# Patient Record
Sex: Female | Born: 1935
Health system: Southern US, Community
[De-identification: ages and names within clinical notes are randomized; demographics above are authoritative.]

## PROBLEM LIST (undated history)

## (undated) DIAGNOSIS — D693 Immune thrombocytopenic purpura: Secondary | ICD-10-CM

## (undated) DIAGNOSIS — Z803 Family history of malignant neoplasm of breast: Secondary | ICD-10-CM

## (undated) DIAGNOSIS — I10 Essential (primary) hypertension: Secondary | ICD-10-CM

## (undated) DIAGNOSIS — K589 Irritable bowel syndrome without diarrhea: Secondary | ICD-10-CM

## (undated) DIAGNOSIS — N6019 Diffuse cystic mastopathy of unspecified breast: Secondary | ICD-10-CM

## (undated) DIAGNOSIS — C50919 Malignant neoplasm of unspecified site of unspecified female breast: Secondary | ICD-10-CM

## (undated) DIAGNOSIS — Z800 Family history of malignant neoplasm of digestive organs: Secondary | ICD-10-CM

## (undated) DIAGNOSIS — K5730 Diverticulosis of large intestine without perforation or abscess without bleeding: Secondary | ICD-10-CM

## (undated) DIAGNOSIS — Z9081 Acquired absence of spleen: Secondary | ICD-10-CM

## (undated) DIAGNOSIS — Z808 Family history of malignant neoplasm of other organs or systems: Secondary | ICD-10-CM

## (undated) DIAGNOSIS — E039 Hypothyroidism, unspecified: Secondary | ICD-10-CM

## (undated) DIAGNOSIS — C801 Malignant (primary) neoplasm, unspecified: Secondary | ICD-10-CM

## (undated) DIAGNOSIS — C55 Malignant neoplasm of uterus, part unspecified: Secondary | ICD-10-CM

## (undated) DIAGNOSIS — I8390 Asymptomatic varicose veins of unspecified lower extremity: Secondary | ICD-10-CM

## (undated) DIAGNOSIS — E0500 Thyrotoxicosis with diffuse goiter without thyrotoxic crisis or storm: Secondary | ICD-10-CM

## (undated) DIAGNOSIS — Z8042 Family history of malignant neoplasm of prostate: Secondary | ICD-10-CM

## (undated) HISTORY — DX: Hypothyroidism, unspecified: E03.9

## (undated) HISTORY — DX: Family history of malignant neoplasm of prostate: Z80.42

## (undated) HISTORY — DX: Family history of malignant neoplasm of digestive organs: Z80.0

## (undated) HISTORY — DX: Thyrotoxicosis with diffuse goiter without thyrotoxic crisis or storm: E05.00

## (undated) HISTORY — DX: Malignant (primary) neoplasm, unspecified: C80.1

## (undated) HISTORY — DX: Diffuse cystic mastopathy of unspecified breast: N60.19

## (undated) HISTORY — PX: OOPHORECTOMY: SHX86

## (undated) HISTORY — DX: Irritable bowel syndrome without diarrhea: K58.9

## (undated) HISTORY — DX: Malignant neoplasm of unspecified site of unspecified female breast: C50.919

## (undated) HISTORY — DX: Malignant neoplasm of uterus, part unspecified: C55

## (undated) HISTORY — DX: Family history of malignant neoplasm of breast: Z80.3

## (undated) HISTORY — DX: Acquired absence of spleen: Z90.81

## (undated) HISTORY — DX: Asymptomatic varicose veins of unspecified lower extremity: I83.90

## (undated) HISTORY — DX: Essential (primary) hypertension: I10

## (undated) HISTORY — DX: Family history of malignant neoplasm of other organs or systems: Z80.8

## (undated) HISTORY — DX: Diverticulosis of large intestine without perforation or abscess without bleeding: K57.30

## (undated) HISTORY — DX: Immune thrombocytopenic purpura: D69.3

---
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
## 1984-03-19 HISTORY — PX: SPLENECTOMY: SUR1306

## 1999-03-20 HISTORY — PX: ABDOMINAL HYSTERECTOMY: SHX81

## 1999-03-23 DIAGNOSIS — C55 Malignant neoplasm of uterus, part unspecified: Secondary | ICD-10-CM

## 1999-03-23 HISTORY — DX: Malignant neoplasm of uterus, part unspecified: C55

## 1999-03-28 ENCOUNTER — Ambulatory Visit (HOSPITAL_COMMUNITY): Admission: RE | Admit: 1999-03-28 | Discharge: 1999-03-28 | Payer: Self-pay | Admitting: Obstetrics and Gynecology

## 1999-04-19 ENCOUNTER — Encounter: Payer: Self-pay | Admitting: Obstetrics and Gynecology

## 1999-04-25 ENCOUNTER — Inpatient Hospital Stay (HOSPITAL_COMMUNITY): Admission: RE | Admit: 1999-04-25 | Discharge: 1999-04-28 | Payer: Self-pay | Admitting: Obstetrics and Gynecology

## 2000-11-19 ENCOUNTER — Other Ambulatory Visit: Admission: RE | Admit: 2000-11-19 | Discharge: 2000-11-19 | Payer: Self-pay | Admitting: Obstetrics and Gynecology

## 2001-05-27 ENCOUNTER — Other Ambulatory Visit: Admission: RE | Admit: 2001-05-27 | Discharge: 2001-05-27 | Payer: Self-pay | Admitting: Obstetrics and Gynecology

## 2001-11-25 ENCOUNTER — Other Ambulatory Visit: Admission: RE | Admit: 2001-11-25 | Discharge: 2001-11-25 | Payer: Self-pay | Admitting: Obstetrics and Gynecology

## 2002-03-19 HISTORY — PX: MASTECTOMY: SHX3

## 2002-05-26 ENCOUNTER — Other Ambulatory Visit: Admission: RE | Admit: 2002-05-26 | Discharge: 2002-05-26 | Payer: Self-pay | Admitting: Obstetrics and Gynecology

## 2002-06-08 ENCOUNTER — Ambulatory Visit (HOSPITAL_COMMUNITY): Admission: RE | Admit: 2002-06-08 | Discharge: 2002-06-08 | Payer: Self-pay | Admitting: Gastroenterology

## 2003-01-04 ENCOUNTER — Other Ambulatory Visit: Admission: RE | Admit: 2003-01-04 | Discharge: 2003-01-04 | Payer: Self-pay | Admitting: Obstetrics and Gynecology

## 2003-02-04 ENCOUNTER — Encounter (HOSPITAL_COMMUNITY): Admission: RE | Admit: 2003-02-04 | Discharge: 2003-05-05 | Payer: Self-pay | Admitting: Radiology

## 2003-02-10 ENCOUNTER — Encounter: Admission: RE | Admit: 2003-02-10 | Discharge: 2003-02-10 | Payer: Self-pay | Admitting: Radiology

## 2003-02-10 ENCOUNTER — Encounter (INDEPENDENT_AMBULATORY_CARE_PROVIDER_SITE_OTHER): Payer: Self-pay | Admitting: Radiology

## 2003-02-10 ENCOUNTER — Encounter (INDEPENDENT_AMBULATORY_CARE_PROVIDER_SITE_OTHER): Payer: Self-pay | Admitting: *Deleted

## 2003-02-10 DIAGNOSIS — C50919 Malignant neoplasm of unspecified site of unspecified female breast: Secondary | ICD-10-CM

## 2003-02-10 HISTORY — DX: Malignant neoplasm of unspecified site of unspecified female breast: C50.919

## 2003-02-16 ENCOUNTER — Encounter: Admission: RE | Admit: 2003-02-16 | Discharge: 2003-02-16 | Payer: Self-pay | Admitting: Surgery

## 2003-02-17 ENCOUNTER — Encounter (INDEPENDENT_AMBULATORY_CARE_PROVIDER_SITE_OTHER): Payer: Self-pay | Admitting: *Deleted

## 2003-02-17 ENCOUNTER — Ambulatory Visit (HOSPITAL_BASED_OUTPATIENT_CLINIC_OR_DEPARTMENT_OTHER): Admission: RE | Admit: 2003-02-17 | Discharge: 2003-02-17 | Payer: Self-pay | Admitting: Surgery

## 2003-02-17 ENCOUNTER — Ambulatory Visit (HOSPITAL_COMMUNITY): Admission: RE | Admit: 2003-02-17 | Discharge: 2003-02-17 | Payer: Self-pay | Admitting: Surgery

## 2003-02-17 HISTORY — PX: BREAST SURGERY: SHX581

## 2003-05-19 ENCOUNTER — Other Ambulatory Visit: Admission: RE | Admit: 2003-05-19 | Discharge: 2003-05-19 | Payer: Self-pay | Admitting: Obstetrics and Gynecology

## 2003-11-17 ENCOUNTER — Other Ambulatory Visit: Admission: RE | Admit: 2003-11-17 | Discharge: 2003-11-17 | Payer: Self-pay | Admitting: Obstetrics and Gynecology

## 2004-03-23 ENCOUNTER — Ambulatory Visit: Payer: Self-pay | Admitting: Oncology

## 2004-05-02 ENCOUNTER — Other Ambulatory Visit: Admission: RE | Admit: 2004-05-02 | Discharge: 2004-05-02 | Payer: Self-pay | Admitting: Obstetrics and Gynecology

## 2004-07-13 ENCOUNTER — Ambulatory Visit: Payer: Self-pay | Admitting: Oncology

## 2004-07-14 ENCOUNTER — Ambulatory Visit (HOSPITAL_COMMUNITY): Admission: RE | Admit: 2004-07-14 | Discharge: 2004-07-14 | Payer: Self-pay

## 2005-01-11 ENCOUNTER — Ambulatory Visit: Payer: Self-pay | Admitting: Oncology

## 2005-01-18 ENCOUNTER — Ambulatory Visit: Payer: Self-pay | Admitting: Oncology

## 2005-03-27 ENCOUNTER — Ambulatory Visit: Payer: Self-pay | Admitting: Oncology

## 2005-05-04 ENCOUNTER — Other Ambulatory Visit: Admission: RE | Admit: 2005-05-04 | Discharge: 2005-05-04 | Payer: Self-pay | Admitting: Obstetrics and Gynecology

## 2005-09-26 ENCOUNTER — Ambulatory Visit: Payer: Self-pay | Admitting: Oncology

## 2005-11-29 ENCOUNTER — Encounter: Admission: RE | Admit: 2005-11-29 | Discharge: 2005-11-29 | Payer: Self-pay | Admitting: Orthopedic Surgery

## 2005-12-19 ENCOUNTER — Encounter: Admission: RE | Admit: 2005-12-19 | Discharge: 2005-12-19 | Payer: Self-pay | Admitting: Orthopedic Surgery

## 2006-01-15 ENCOUNTER — Ambulatory Visit: Payer: Self-pay | Admitting: Oncology

## 2006-01-17 LAB — COMPREHENSIVE METABOLIC PANEL
AST: 27 U/L (ref 0–37)
Alkaline Phosphatase: 62 U/L (ref 39–117)
CO2: 28 mEq/L (ref 19–32)
Creatinine, Ser: 0.67 mg/dL (ref 0.40–1.20)
Sodium: 138 mEq/L (ref 135–145)
Total Bilirubin: 0.8 mg/dL (ref 0.3–1.2)
Total Protein: 7.3 g/dL (ref 6.0–8.3)

## 2006-01-17 LAB — CBC WITH DIFFERENTIAL (CANCER CENTER ONLY)
Eosinophils Absolute: 0.1 10*3/uL (ref 0.0–0.5)
HCT: 42.7 % (ref 34.8–46.6)
HGB: 14.4 g/dL (ref 11.6–15.9)
MCHC: 33.8 g/dL (ref 32.0–36.0)
MCV: 96 fL (ref 81–101)
MONO%: 7.8 % (ref 0.0–13.0)
RDW: 12.3 % (ref 10.5–14.6)

## 2006-01-25 ENCOUNTER — Ambulatory Visit: Payer: Self-pay | Admitting: Oncology

## 2006-02-22 ENCOUNTER — Encounter: Admission: RE | Admit: 2006-02-22 | Discharge: 2006-02-22 | Payer: Self-pay | Admitting: Orthopedic Surgery

## 2006-05-23 ENCOUNTER — Other Ambulatory Visit: Admission: RE | Admit: 2006-05-23 | Discharge: 2006-05-23 | Payer: Self-pay | Admitting: Obstetrics and Gynecology

## 2006-07-18 ENCOUNTER — Ambulatory Visit: Payer: Self-pay | Admitting: Oncology

## 2006-07-19 LAB — CBC WITH DIFFERENTIAL (CANCER CENTER ONLY)
BASO%: 0.5 % (ref 0.0–2.0)
HCT: 42.2 % (ref 34.8–46.6)
MCH: 32.8 pg (ref 26.0–34.0)
MCV: 95 fL (ref 81–101)
NEUT#: 3.4 10*3/uL (ref 1.5–6.5)
NEUT%: 62.5 % (ref 39.6–80.0)
RBC: 4.45 10*6/uL (ref 3.70–5.32)
WBC: 5.4 10*3/uL (ref 3.9–10.0)

## 2006-07-19 LAB — COMPREHENSIVE METABOLIC PANEL
AST: 26 U/L (ref 0–37)
Alkaline Phosphatase: 68 U/L (ref 39–117)
BUN: 13 mg/dL (ref 6–23)
CO2: 28 mEq/L (ref 19–32)
Creatinine, Ser: 0.7 mg/dL (ref 0.40–1.20)
Glucose, Bld: 88 mg/dL (ref 70–99)
Sodium: 141 mEq/L (ref 135–145)
Total Protein: 7.1 g/dL (ref 6.0–8.3)

## 2006-07-26 ENCOUNTER — Ambulatory Visit: Payer: Self-pay | Admitting: Oncology

## 2007-01-10 ENCOUNTER — Ambulatory Visit: Payer: Self-pay | Admitting: Oncology

## 2007-01-13 LAB — COMPREHENSIVE METABOLIC PANEL
ALT: 36 U/L — ABNORMAL HIGH (ref 0–35)
Alkaline Phosphatase: 68 U/L (ref 39–117)
BUN: 13 mg/dL (ref 6–23)
CO2: 28 mEq/L (ref 19–32)
Calcium: 9.8 mg/dL (ref 8.4–10.5)
Potassium: 4.1 mEq/L (ref 3.5–5.3)
Sodium: 142 mEq/L (ref 135–145)
Total Bilirubin: 0.7 mg/dL (ref 0.3–1.2)

## 2007-01-13 LAB — CBC WITH DIFFERENTIAL (CANCER CENTER ONLY)
BASO#: 0 10*3/uL (ref 0.0–0.2)
Eosinophils Absolute: 0.1 10*3/uL (ref 0.0–0.5)
HGB: 13.4 g/dL (ref 11.6–15.9)
LYMPH#: 1.3 10*3/uL (ref 0.9–3.3)
LYMPH%: 26.7 % (ref 14.0–48.0)
MCV: 95 fL (ref 81–101)
MONO%: 8.5 % (ref 0.0–13.0)
Platelets: 289 10*3/uL (ref 145–400)
WBC: 5 10*3/uL (ref 3.9–10.0)

## 2007-01-20 ENCOUNTER — Ambulatory Visit: Payer: Self-pay | Admitting: Oncology

## 2007-05-26 ENCOUNTER — Other Ambulatory Visit: Admission: RE | Admit: 2007-05-26 | Discharge: 2007-05-26 | Payer: Self-pay | Admitting: Obstetrics and Gynecology

## 2008-01-12 ENCOUNTER — Ambulatory Visit: Payer: Self-pay | Admitting: Oncology

## 2008-01-13 LAB — CBC WITH DIFFERENTIAL (CANCER CENTER ONLY)
HCT: 38.5 % (ref 34.8–46.6)
HGB: 13.4 g/dL (ref 11.6–15.9)
LYMPH#: 1.3 10*3/uL (ref 0.9–3.3)
LYMPH%: 28.8 % (ref 14.0–48.0)
MCH: 32 pg (ref 26.0–34.0)
NEUT#: 2.8 10*3/uL (ref 1.5–6.5)
NEUT%: 60.1 % (ref 39.6–80.0)
Platelets: 297 10*3/uL (ref 145–400)
RBC: 4.19 10*6/uL (ref 3.70–5.32)

## 2008-01-13 LAB — CMP (CANCER CENTER ONLY)
ALT(SGPT): 28 U/L (ref 10–47)
AST: 41 U/L — ABNORMAL HIGH (ref 11–38)
Albumin: 4.1 g/dL (ref 3.3–5.5)
CO2: 31 mEq/L (ref 18–33)
Calcium: 9.7 mg/dL (ref 8.0–10.3)
Chloride: 101 mEq/L (ref 98–108)
Creat: 0.5 mg/dl — ABNORMAL LOW (ref 0.6–1.2)
Potassium: 3.9 mEq/L (ref 3.3–4.7)
Total Protein: 7.5 g/dL (ref 6.4–8.1)

## 2008-01-13 LAB — LACTATE DEHYDROGENASE: LDH: 159 U/L (ref 94–250)

## 2008-02-02 ENCOUNTER — Ambulatory Visit: Payer: Self-pay | Admitting: Oncology

## 2008-05-26 ENCOUNTER — Other Ambulatory Visit: Admission: RE | Admit: 2008-05-26 | Discharge: 2008-05-26 | Payer: Self-pay | Admitting: Obstetrics and Gynecology

## 2008-05-26 ENCOUNTER — Ambulatory Visit: Payer: Self-pay | Admitting: Obstetrics and Gynecology

## 2008-05-26 ENCOUNTER — Encounter: Payer: Self-pay | Admitting: Obstetrics and Gynecology

## 2008-06-04 ENCOUNTER — Ambulatory Visit: Payer: Self-pay | Admitting: Obstetrics and Gynecology

## 2009-01-20 ENCOUNTER — Ambulatory Visit: Payer: Self-pay | Admitting: Oncology

## 2009-01-24 LAB — CBC WITH DIFFERENTIAL (CANCER CENTER ONLY)
MCH: 32 pg (ref 26.0–34.0)
MCV: 95 fL (ref 81–101)
MONO#: 0.4 10*3/uL (ref 0.1–0.9)
NEUT#: 2.6 10*3/uL (ref 1.5–6.5)
NEUT%: 57.7 % (ref 39.6–80.0)
Platelets: 288 10*3/uL (ref 145–400)
RBC: 3.95 10*6/uL (ref 3.70–5.32)
WBC: 4.4 10*3/uL (ref 3.9–10.0)

## 2009-01-24 LAB — CMP (CANCER CENTER ONLY)
BUN, Bld: 13 mg/dL (ref 7–22)
CO2: 30 mEq/L (ref 18–33)
Potassium: 3.9 mEq/L (ref 3.3–4.7)

## 2009-01-25 LAB — LACTATE DEHYDROGENASE: LDH: 167 U/L (ref 94–250)

## 2009-01-25 LAB — VITAMIN D 25 HYDROXY (VIT D DEFICIENCY, FRACTURES): Vit D, 25-Hydroxy: 41 ng/mL (ref 30–89)

## 2009-01-25 LAB — TSH: TSH: 1.176 u[IU]/mL (ref 0.350–4.500)

## 2009-01-30 ENCOUNTER — Ambulatory Visit: Payer: Self-pay | Admitting: Oncology

## 2009-02-28 ENCOUNTER — Ambulatory Visit (HOSPITAL_COMMUNITY): Admission: RE | Admit: 2009-02-28 | Discharge: 2009-02-28 | Payer: Self-pay | Admitting: Oncology

## 2009-05-31 ENCOUNTER — Other Ambulatory Visit: Admission: RE | Admit: 2009-05-31 | Discharge: 2009-05-31 | Payer: Self-pay | Admitting: Obstetrics and Gynecology

## 2009-05-31 ENCOUNTER — Ambulatory Visit: Payer: Self-pay | Admitting: Obstetrics and Gynecology

## 2010-01-19 ENCOUNTER — Ambulatory Visit: Payer: Self-pay | Admitting: Oncology

## 2010-01-31 LAB — CBC WITH DIFFERENTIAL (CANCER CENTER ONLY)
BASO#: 0 10*3/uL (ref 0.0–0.2)
BASO%: 0.6 % (ref 0.0–2.0)
EOS%: 2.8 % (ref 0.0–7.0)
HCT: 38.6 % (ref 34.8–46.6)
MCHC: 34.7 g/dL (ref 32.0–36.0)
MCV: 93 fL (ref 81–101)
MONO#: 0.5 10*3/uL (ref 0.1–0.9)
NEUT#: 2.5 10*3/uL (ref 1.5–6.5)
NEUT%: 58.4 % (ref 39.6–80.0)
Platelets: 281 10*3/uL (ref 145–400)
WBC: 4.3 10*3/uL (ref 3.9–10.0)

## 2010-01-31 LAB — CMP (CANCER CENTER ONLY)
AST: 43 U/L — ABNORMAL HIGH (ref 11–38)
CO2: 29 mEq/L (ref 18–33)
Calcium: 9.8 mg/dL (ref 8.0–10.3)
Chloride: 98 mEq/L (ref 98–108)
Glucose, Bld: 99 mg/dL (ref 73–118)
Potassium: 4.2 mEq/L (ref 3.3–4.7)
Total Bilirubin: 0.9 mg/dl (ref 0.20–1.60)
Total Protein: 7.5 g/dL (ref 6.4–8.1)

## 2010-01-31 LAB — LACTATE DEHYDROGENASE: LDH: 168 U/L (ref 94–250)

## 2010-02-03 ENCOUNTER — Ambulatory Visit: Payer: Self-pay | Admitting: Oncology

## 2010-04-08 ENCOUNTER — Encounter: Payer: Self-pay | Admitting: Orthopedic Surgery

## 2010-04-08 ENCOUNTER — Encounter: Payer: Self-pay | Admitting: Surgery

## 2010-07-03 ENCOUNTER — Other Ambulatory Visit (HOSPITAL_COMMUNITY)
Admission: RE | Admit: 2010-07-03 | Discharge: 2010-07-03 | Disposition: A | Discharge status: Home / Self Care | Payer: Medicare Other | Source: Ambulatory Visit | Attending: Obstetrics and Gynecology | Admitting: Obstetrics and Gynecology

## 2010-07-03 ENCOUNTER — Encounter (INDEPENDENT_AMBULATORY_CARE_PROVIDER_SITE_OTHER): Payer: Medicare Other | Admitting: Obstetrics and Gynecology

## 2010-07-03 ENCOUNTER — Other Ambulatory Visit: Payer: Self-pay | Admitting: Obstetrics and Gynecology

## 2010-07-03 DIAGNOSIS — C549 Malignant neoplasm of corpus uteri, unspecified: Secondary | ICD-10-CM

## 2010-07-03 DIAGNOSIS — Z124 Encounter for screening for malignant neoplasm of cervix: Secondary | ICD-10-CM

## 2010-07-03 DIAGNOSIS — C50919 Malignant neoplasm of unspecified site of unspecified female breast: Secondary | ICD-10-CM

## 2010-07-03 DIAGNOSIS — N951 Menopausal and female climacteric states: Secondary | ICD-10-CM

## 2010-07-03 DIAGNOSIS — Z9189 Other specified personal risk factors, not elsewhere classified: Secondary | ICD-10-CM | POA: Insufficient documentation

## 2010-07-03 DIAGNOSIS — R82998 Other abnormal findings in urine: Secondary | ICD-10-CM

## 2010-07-13 ENCOUNTER — Other Ambulatory Visit (INDEPENDENT_AMBULATORY_CARE_PROVIDER_SITE_OTHER): Payer: Medicare Other

## 2010-07-13 DIAGNOSIS — N390 Urinary tract infection, site not specified: Secondary | ICD-10-CM

## 2010-07-17 ENCOUNTER — Other Ambulatory Visit (INDEPENDENT_AMBULATORY_CARE_PROVIDER_SITE_OTHER): Payer: Medicare Other

## 2010-07-17 DIAGNOSIS — Z5189 Encounter for other specified aftercare: Secondary | ICD-10-CM

## 2010-08-04 NOTE — Op Note (Signed)
NAME:  Ashlee Lopez, Ashlee Lopez                            ACCOUNT NO.:  [id number]   MEDICAL RECORD NO.:  [id number]                   PATIENT TYPE:  AMB   LOCATION:  DSC                                  FACILITY:  [facility]   PHYSICIAN:  Currie Paris, M.D.           DATE OF BIRTH:  08/07/1933   DATE OF PROCEDURE:  02/17/2003  DATE OF DISCHARGE:                                 OPERATIVE REPORT   OFFICE MEDICAL RECORD NUMBER:  [id number]   PREOPERATIVE DIAGNOSES:  1. Carcinoma of the left breast (invasive tubular).  2. Calcifications suspicious for ductal carcinoma in situ, right breast.   POSTOPERATIVE DIAGNOSES:  1. Carcinoma of the left breast (invasive tubular).  2. Calcifications suspicious for ductal carcinoma in situ, right breast.   OPERATION:  Right total mastectomy with blue dye injection and sentinel  lymph node biopsy (three nodes).   SURGEON:  Currie Paris, M.D.   ASSISTANT:  Rose Phi. Maple Hudson, M.D.   ANESTHESIA:  General endotracheal.   CLINICAL HISTORY:  This patient is a 77 year old whose mammogram showed some  changes and area of calcifications which core biopsy had been suspicious for  ductal carcinoma in situ but not diagnostic.  A second area had been  identified, and MRI was worrisome, and this was biopsied, and this proved to  be in another quadrant of the breast and small invasive cancer.  After the  discussion and options of the choices with the patient, she elected to go  with a total mastectomy and no reconstruction with plans for sentinel node  biopsy.   DESCRIPTION OF PROCEDURE:  The patient was seen in the holding area and had  no further questions.  She had already been injected in the right breast  with her radioisotope, and I marked that as the operative site.   She was taken to the operating room and after satisfactory general  anesthesia had been obtained, 4 cc of dilute methylene blue were injected  subareolarly and massaged in.  The breast was  then prepped and draped.  A  transverse incision was made to encompass the old biopsy entry site  laterally as well as medially.  A skin flap was raised medially to the  sternum, superiorly to the clavicle, and laterally into the axilla.  I had  already used a Neoprobe to identify a hot area.  Once I had the skin  elevated, using the Neoprobe, I divided, dissected down, and I saw a blue  lymphatic followed almost immediately by a blue lymph node which had counts  of up to 4000.  This was excised using some clips to control the lymphatics  and blood vessels.  Using the Neoprobe and further dissection, I identified  two other nodes in the area, both of which were hot, although the first node  was by far the hottest, and both were also blue, and both were excised.  No  other hot areas, no other blue areas, and no other palpable adenopathy was  noted.  Everything appeared to be dry.  A pack was placed, and attention  turned back to the breast.  The inferior flap incision was made, and the  flap raised in the usual fashion to the inframammary fold and rectus and  then laterally out to the latissimus.  The breast was removed from the  underlying chest wall using cautery starting medially and working laterally  and disconnecting the final attachments at the edge of the latissimus  without doing any further nodal dissection in the axilla.  We irrigated and  spent several minutes making sure everything appeared to be dry, and there  was no bleeding.  Two 19 Blake drains were placed, one towards the axilla  and one under the main medial flap.  Final check again was made for  hemostasis, and then the incision closed with staples.  Pathology reported  that the sentinel nodes were negative for metastatic disease.   The patient tolerated the procedure well.  There were no operative  complications.  All counts were correct.  The estimated blood loss was less  than 100 cc.                                                Currie Paris, M.D.    CJS/MEDQ  D:  02/17/2003  T:  02/17/2003  Job:  [id number]   cc:   Loma Sender  [address]  [place]  [place] [address]  Fax: [phone]   Jeralyn Ruths, M.D.

## 2010-08-04 NOTE — Op Note (Signed)
Ashlee Lopez, Ashlee Lopez                              ACCOUNT NO.:  [id number]   MEDICAL RECORD NO.:  [id number]                   PATIENT TYPE:  AMB   LOCATION:  ENDO                                 FACILITY:  [facility]   PHYSICIAN:  James L. Malon Kindle., M.D.          DATE OF BIRTH:  Apr 09, 1933   DATE OF PROCEDURE:  06/08/2002  DATE OF DISCHARGE:                                 OPERATIVE REPORT   PROCEDURE:  Colonoscopy.   MEDICATIONS:  Fentanyl 100 mcg, Versed 10 mg IV.   SCOPE:  Olympus pediatric adjustable colonoscope.   INDICATIONS FOR PROCEDURE:  A nice 77 year old woman with left lower  quadrant pain with some streaks of blood with her bowel movement. She did  have a grandparent with colon cancer and been under a lot of stress recently  but in view of this worsening left sided pain, bleeding and her family  history, it was felt that a colonoscopy was appropriate.   DESCRIPTION OF PROCEDURE:  The procedure had been explained to the patient  and consent obtained. With the patient in the left lateral decubitus  position, the Olympus scope was inserted and advanced under direct  visualization. The prep was excellent. The patient had moderate  diverticulosis on the left side of the colon but no evidence of active  diverticulitis. We were able to pass this and by using a bit of abdominal  pressure advanced to the cecum. The right lower quadrant was  transilluminated, ileocecal valve and appendiceal orifice were seen. The  scope was withdrawn and the cecum, ascending colon, transverse, descending  and sigmoid colon were seen well upon removal. No polyps or other lesions  were seen. Moderate diverticulosis in the sigmoid colon. The scope was  withdrawn in the rectum and retroflexed with the only finding of internal  hemorrhoids. The scope withdrawn. The patient tolerated the procedure well  and was maintained on low flow oxygen and pulse oximeter throughout the  procedure.    ASSESSMENT:  1. Diverticulosis without active diverticulitis.  2. Internal hemorrhoids possibly the source of her rectal bleeding.  3. Strong family history of colon cancer.    PLAN:  Will start on fiber supplements, give her diverticulosis information  sheet. Will recommend repeating in five years. Will give Levbid 1/2 to 1  tablet daily to see if this helps and will see back in the office in two  months.                                               James L. Malon Kindle., M.D.    Ashlee Lopez  D:  06/08/2002  T:  06/08/2002  Job:  [id number]   cc:   Reuel Boom L. Eda Paschal, M.D.  [address], [address]  [place]  [place] [address]  Fax: [phone]   Loma Sender  [address]  [place]  [place] [address]  Fax: [phone]

## 2010-08-18 ENCOUNTER — Encounter (INDEPENDENT_AMBULATORY_CARE_PROVIDER_SITE_OTHER): Payer: Self-pay | Admitting: Surgery

## 2010-12-11 ENCOUNTER — Encounter (INDEPENDENT_AMBULATORY_CARE_PROVIDER_SITE_OTHER): Payer: Self-pay | Admitting: General Surgery

## 2010-12-11 DIAGNOSIS — Z853 Personal history of malignant neoplasm of breast: Secondary | ICD-10-CM | POA: Insufficient documentation

## 2010-12-13 ENCOUNTER — Ambulatory Visit (INDEPENDENT_AMBULATORY_CARE_PROVIDER_SITE_OTHER): Payer: Medicare Other | Admitting: Surgery

## 2010-12-13 ENCOUNTER — Encounter (INDEPENDENT_AMBULATORY_CARE_PROVIDER_SITE_OTHER): Payer: Self-pay | Admitting: Surgery

## 2010-12-13 VITALS — BP 118/78 | HR 80 | Temp 99.0°F | Resp 16 | Ht 65.25 in | Wt 156.4 lb

## 2010-12-13 DIAGNOSIS — Z853 Personal history of malignant neoplasm of breast: Secondary | ICD-10-CM

## 2010-12-13 NOTE — Patient Instructions (Signed)
Continue annual follow up until 10 years from surgery

## 2010-12-13 NOTE — Progress Notes (Signed)
NAME: Ashlee Lopez       DOB: 09-Feb-1934           DATE: 12/13/2010       MRN: [id number]   Ashlee Lopez is a 78 y.o.Marland Kitchenfemale who presents for routine followup of her Right breast cancer diagnosed in 2004 and treated with right mastectomy. She has no problems or concerns on either side.  PFSH: She has had no significant changes since the last visit here.  ROS: There have been no significant changes since the last visit here  EXAM: General: The patient is alert, oriented, generally healty appearing, NAD. Mood and affect are normal.  Breasts: The right breast is surgically absent. There is no evidence of recurrence. The left breast is normal to inspection and palpation. There are no suspicious areas noted.   Lymphatics: She has no axillary or supraclavicular adenopathy on either side.  Extremities: Full ROM of the surgical side with no lymphedema noted.  Data Reviewed: Mammogram was negative  Impression: Doing well, with no evidence of recurrent cancer or new cancer  Plan: Will continue to follow up on an annual basis here.

## 2011-01-29 ENCOUNTER — Other Ambulatory Visit: Payer: Self-pay | Admitting: Oncology

## 2011-01-29 ENCOUNTER — Other Ambulatory Visit (HOSPITAL_BASED_OUTPATIENT_CLINIC_OR_DEPARTMENT_OTHER): Payer: Medicare Other | Admitting: Lab

## 2011-01-29 ENCOUNTER — Telehealth: Payer: Self-pay | Admitting: Oncology

## 2011-01-29 DIAGNOSIS — Z9089 Acquired absence of other organs: Secondary | ICD-10-CM

## 2011-01-29 DIAGNOSIS — C50419 Malignant neoplasm of upper-outer quadrant of unspecified female breast: Secondary | ICD-10-CM

## 2011-01-29 LAB — CBC WITH DIFFERENTIAL/PLATELET
Basophils Absolute: 0 10*3/uL (ref 0.0–0.1)
Eosinophils Absolute: 0.1 10*3/uL (ref 0.0–0.5)
LYMPH%: 23.7 % (ref 14.0–49.7)
MCHC: 34.5 g/dL (ref 31.5–36.0)
MONO#: 0.7 10*3/uL (ref 0.1–0.9)
MONO%: 11.1 % (ref 0.0–14.0)
NEUT#: 3.8 10*3/uL (ref 1.5–6.5)
Platelets: 250 10*3/uL (ref 145–400)
RBC: 3.98 10*6/uL (ref 3.70–5.45)

## 2011-01-29 LAB — COMPREHENSIVE METABOLIC PANEL
ALT: 17 U/L (ref 0–35)
AST: 28 U/L (ref 0–37)
Alkaline Phosphatase: 60 U/L (ref 39–117)
BUN: 11 mg/dL (ref 6–23)
Creatinine, Ser: 0.76 mg/dL (ref 0.50–1.10)
Glucose, Bld: 119 mg/dL — ABNORMAL HIGH (ref 70–99)
Sodium: 136 mEq/L (ref 135–145)
Total Bilirubin: 0.5 mg/dL (ref 0.3–1.2)
Total Protein: 7 g/dL (ref 6.0–8.3)

## 2011-01-29 NOTE — Telephone Encounter (Signed)
Called pt , left message to call us and r/s appt

## 2011-01-31 ENCOUNTER — Encounter: Payer: Self-pay | Admitting: Obstetrics and Gynecology

## 2011-03-21 ENCOUNTER — Encounter: Payer: Self-pay | Admitting: Oncology

## 2011-03-21 ENCOUNTER — Other Ambulatory Visit: Payer: Self-pay | Admitting: Oncology

## 2011-03-21 DIAGNOSIS — D693 Immune thrombocytopenic purpura: Secondary | ICD-10-CM

## 2011-03-21 DIAGNOSIS — E039 Hypothyroidism, unspecified: Secondary | ICD-10-CM

## 2011-03-21 DIAGNOSIS — I10 Essential (primary) hypertension: Secondary | ICD-10-CM

## 2011-03-21 DIAGNOSIS — C50919 Malignant neoplasm of unspecified site of unspecified female breast: Secondary | ICD-10-CM

## 2011-03-21 DIAGNOSIS — Z9081 Acquired absence of spleen: Secondary | ICD-10-CM

## 2011-03-21 HISTORY — DX: Hypothyroidism, unspecified: E03.9

## 2011-03-21 HISTORY — DX: Acquired absence of spleen: Z90.81

## 2011-03-21 HISTORY — DX: Essential (primary) hypertension: I10

## 2011-03-21 HISTORY — DX: Immune thrombocytopenic purpura: D69.3

## 2011-03-23 ENCOUNTER — Ambulatory Visit (HOSPITAL_BASED_OUTPATIENT_CLINIC_OR_DEPARTMENT_OTHER): Payer: Medicare Other | Admitting: Oncology

## 2011-03-23 ENCOUNTER — Encounter: Payer: Self-pay | Admitting: Oncology

## 2011-03-23 ENCOUNTER — Telehealth: Payer: Self-pay | Admitting: Oncology

## 2011-03-23 ENCOUNTER — Ambulatory Visit: Payer: Medicare Other | Admitting: Oncology

## 2011-03-23 VITALS — BP 194/93 | HR 77 | Temp 97.1°F | Ht 65.0 in | Wt 161.7 lb

## 2011-03-23 DIAGNOSIS — Z853 Personal history of malignant neoplasm of breast: Secondary | ICD-10-CM

## 2011-03-23 DIAGNOSIS — C50919 Malignant neoplasm of unspecified site of unspecified female breast: Secondary | ICD-10-CM

## 2011-03-23 DIAGNOSIS — E0500 Thyrotoxicosis with diffuse goiter without thyrotoxic crisis or storm: Secondary | ICD-10-CM | POA: Insufficient documentation

## 2011-03-23 DIAGNOSIS — Z9089 Acquired absence of other organs: Secondary | ICD-10-CM

## 2011-03-23 DIAGNOSIS — I8390 Asymptomatic varicose veins of unspecified lower extremity: Secondary | ICD-10-CM | POA: Insufficient documentation

## 2011-03-23 DIAGNOSIS — D693 Immune thrombocytopenic purpura: Secondary | ICD-10-CM

## 2011-03-23 DIAGNOSIS — K5730 Diverticulosis of large intestine without perforation or abscess without bleeding: Secondary | ICD-10-CM

## 2011-03-23 DIAGNOSIS — K589 Irritable bowel syndrome without diarrhea: Secondary | ICD-10-CM

## 2011-03-23 DIAGNOSIS — Z9081 Acquired absence of spleen: Secondary | ICD-10-CM

## 2011-03-23 DIAGNOSIS — N6019 Diffuse cystic mastopathy of unspecified breast: Secondary | ICD-10-CM | POA: Insufficient documentation

## 2011-03-23 DIAGNOSIS — C55 Malignant neoplasm of uterus, part unspecified: Secondary | ICD-10-CM

## 2011-03-23 DIAGNOSIS — Z23 Encounter for immunization: Secondary | ICD-10-CM

## 2011-03-23 HISTORY — DX: Diverticulosis of large intestine without perforation or abscess without bleeding: K57.30

## 2011-03-23 HISTORY — DX: Irritable bowel syndrome, unspecified: K58.9

## 2011-03-23 HISTORY — DX: Thyrotoxicosis with diffuse goiter without thyrotoxic crisis or storm: E05.00

## 2011-03-23 HISTORY — DX: Diffuse cystic mastopathy of unspecified breast: N60.19

## 2011-03-23 HISTORY — DX: Asymptomatic varicose veins of unspecified lower extremity: I83.90

## 2011-03-23 MED ORDER — PNEUMOCOCCAL VAC POLYVALENT 25 MCG/0.5ML IJ INJ
0.5000 mL | INJECTION | INTRAMUSCULAR | Status: AC
  Administered 2011-03-23: 0.5 mL via INTRAMUSCULAR
  Filled 2011-03-23: qty 0.5

## 2011-03-23 NOTE — Telephone Encounter (Signed)
Pt given a calendar for 2014, pt will call [facility] to schedule own mammogram on 2014.

## 2011-03-23 NOTE — Progress Notes (Signed)
Hematology and Oncology Follow Up Visit  AZRIELLA MATTIA [id number] 08/17/1933 78 y.o. 03/23/2011 7:44 PM   Principle Diagnosis: Encounter Diagnoses  Name Primary?  . Breast cancer, stage 1   . ITP (idiopathic thrombocytopenic purpura)   . S/P splenectomy Yes     Interim History:   Followup visit for this 78 year old woman diagnosed with stage ER/PR positive HER-2 negative lymph node negative cancer of the right breast diagnosed in November 2004. She had multifocal disease. 2.3 x 4.2 x 2.8 cm mass in the medial aspect of the right breast and a 4 mm enhancing focus in the upper outer quadrant. Smaller lesion was invasive carcinoma larger lesion was DCIS. Invasive component was strongly ER/PR positive. Due to the multifocal nature of the disease, mastectomy was done 02/17/03. 3 sentinel lymph nodes negative for tumor. The DCIS component was high-grade. Maximum dimension 1.8 cm. Additional areas of abnormality in the breast including areas of sclerosing papilloma. Florid fibrocystic disease. No residual invasive disease in the small invasive component and grade was 1. She was treated with 5 years of Aromasin hormonal therapy which was stopped 01/31/09. She's had no interim medical problems.  Medications: reviewed  Allergies:  Allergies  Allergen Reactions  . Septra (Bactrim)     Review of Systems: Constitutional:   None Respiratory: Low-grade bronchitis over the last 5 days no fever no sputum production already improving  Cardiovascular:  No chest pain or palpitation  Gastrointestinal: No abdominal pain  Genito-Urinary: No vaginal bleeding Musculoskeletal: No bone pain Neurologic: No headache or change in vision Skin: Not evaluated Remaining ROS negative.  Physical Exam: Blood pressure 194/93, pulse 77, temperature 97.1 F (36.2 C), temperature source Oral, height 5\' 5"  (1.651 m), weight 161 lb 11.2 oz (73.347 kg). Wt Readings from Last 3 Encounters:  03/23/11 161 lb 11.2 oz (73.347  kg)  12/13/10 156 lb 6 oz (70.931 kg)     General appearance: Well-nourished Caucasian woman Head: Normal Neck: Normal Lymph nodes: No cervical supraclavicular or axillary adenopathy Breasts: Right mastectomy no chest wall lesions no left breast masses Lungs: Clear to auscultation resonant to percussion Heart: Regular cardiac rhythm no murmur  Abdomen: Soft nontender no mass no organomegaly Extremities: No edema no calf tenderness  Vascular: No cyanosis  Neurologic: Motor strength 5 over 5  Skin: No rash or ecchymosis  Lab Results: Lab Results  Component Value Date   WBC 6.0 01/29/2011   HGB 13.0 01/29/2011   HCT 37.7 01/29/2011   MCV 94.8 01/29/2011   PLT 250 01/29/2011     Chemistry      Component Value Date/Time   NA 136 01/29/2011 1331   NA 140 01/31/2010 0858   K 3.9 01/29/2011 1331   K 4.2 01/31/2010 0858   CL 97 01/29/2011 1331   CL 98 01/31/2010 0858   CO2 27 01/29/2011 1331   CO2 29 01/31/2010 0858   BUN 11 01/29/2011 1331   BUN 16 01/31/2010 0858   CREATININE 0.76 01/29/2011 1331   CREATININE 0.7 01/31/2010 0858      Component Value Date/Time   CALCIUM 9.5 01/29/2011 1331   CALCIUM 9.8 01/31/2010 0858   ALKPHOS 60 01/29/2011 1331   ALKPHOS 53 01/31/2010 0858   AST 28 01/29/2011 1331   AST 43* 01/31/2010 0858   ALT 17 01/29/2011 1331   BILITOT 0.5 01/29/2011 1331   BILITOT 0.90 01/31/2010 0858       Radiological Studies: Left mammogram done earlier this week shows no suspicious findings  Impression and Plan: #1. Stage I multifocal DCIS and invasive breast cancer right breast treated as outlined above. She remains free of any obvious new disease now out over 7 years from diagnosis. Plan continue annual exam and mammogram.  #2. Hypothyroid on replacement. Subsequent to radioactive iodine treatment for Graves' disease  #3. Essential hypertension  #4. History of ITP diagnosed 1986 requiring splenectomy and immunosuppressive drugs in the  past.  #5 irritable bowel syndrome.  #6. Diverticulosis.  #7. History of recurrent urinary tract infections.  #8. Previous appendectomy  #9. Previous ligation of varicose veins right leg  #10. Status post TAH BSO for early stage uterine cancer 2001  Spent more than half the time coordinating care.    Levert Feinstein, MD 1/4/20137:44 PM

## 2011-03-26 ENCOUNTER — Encounter: Payer: Self-pay | Admitting: Obstetrics and Gynecology

## 2011-03-28 ENCOUNTER — Encounter (INDEPENDENT_AMBULATORY_CARE_PROVIDER_SITE_OTHER): Payer: Self-pay | Admitting: Surgery

## 2011-06-29 ENCOUNTER — Encounter: Payer: Self-pay | Admitting: Gynecology

## 2011-06-29 DIAGNOSIS — C801 Malignant (primary) neoplasm, unspecified: Secondary | ICD-10-CM | POA: Insufficient documentation

## 2011-06-29 DIAGNOSIS — I10 Essential (primary) hypertension: Secondary | ICD-10-CM | POA: Insufficient documentation

## 2011-07-11 ENCOUNTER — Encounter: Payer: Self-pay | Admitting: Obstetrics and Gynecology

## 2011-07-11 ENCOUNTER — Ambulatory Visit (INDEPENDENT_AMBULATORY_CARE_PROVIDER_SITE_OTHER): Payer: Medicare Other | Admitting: Obstetrics and Gynecology

## 2011-07-11 ENCOUNTER — Other Ambulatory Visit (HOSPITAL_COMMUNITY)
Admission: RE | Admit: 2011-07-11 | Discharge: 2011-07-11 | Disposition: A | Discharge status: Home / Self Care | Payer: Medicare Other | Source: Ambulatory Visit | Attending: Obstetrics and Gynecology | Admitting: Obstetrics and Gynecology

## 2011-07-11 VITALS — BP 120/76 | Ht 64.5 in | Wt 159.0 lb

## 2011-07-11 DIAGNOSIS — N390 Urinary tract infection, site not specified: Secondary | ICD-10-CM

## 2011-07-11 DIAGNOSIS — C549 Malignant neoplasm of corpus uteri, unspecified: Secondary | ICD-10-CM

## 2011-07-11 DIAGNOSIS — C50919 Malignant neoplasm of unspecified site of unspecified female breast: Secondary | ICD-10-CM

## 2011-07-11 DIAGNOSIS — N952 Postmenopausal atrophic vaginitis: Secondary | ICD-10-CM

## 2011-07-11 DIAGNOSIS — M899 Disorder of bone, unspecified: Secondary | ICD-10-CM

## 2011-07-11 DIAGNOSIS — C541 Malignant neoplasm of endometrium: Secondary | ICD-10-CM

## 2011-07-11 DIAGNOSIS — R351 Nocturia: Secondary | ICD-10-CM

## 2011-07-11 DIAGNOSIS — M8580 Other specified disorders of bone density and structure, unspecified site: Secondary | ICD-10-CM

## 2011-07-11 DIAGNOSIS — Z01419 Encounter for gynecological examination (general) (routine) without abnormal findings: Secondary | ICD-10-CM | POA: Insufficient documentation

## 2011-07-11 NOTE — Progress Notes (Signed)
Patient came back to see me today for further followup. When she done her bone density in the fall of 2012 she had an elevated FRAX risk with osteopenia. We had asked her to return but she did not until today. Her FRAX risk at the radiologist was 19%/5%. We calculated today at 18%/9.9%. Her risks factors include long-term use of steroids she's currently not on, mother who fractured her hip, use of aromatase inhibitors for her breast cancer which she is also currently not on. She takes calcium and vitamin D. So far she has had no fractures. She does have atrophic vaginitis but is asymptomatic. She has been treated for breast cancer and is cancer free. She is up-to-date on mammograms. She gets frequent UTIs but is currently asymptomatic. She has nocturia and urgency but does not feel she needs treatment. She will occasionally have urgency incontinence. She is having no vaginal bleeding. She is having no pelvic pain. She is status post treatment for endometrial cancer by me in 2002.  ROS: 12 system review done. Pertinent positives above. Other positives include hypothyroidism, ITP, diverticulosis, irritable bowel syndrome, and hypertension.  HEENT: Within normal limits. Kim gardner present. Neck: No masses. Supraclavicular lymph nodes: Not enlarged. Breasts: Examined in both sitting and lying position. Symmetrical without skin changes or masses. Abdomen: Soft no masses guarding or rebound. No hernias. Pelvic: External within normal limits. BUS within normal limits. Vaginal examination shows good estrogen effect, no cystocele enterocele or rectocele. Cervix and uterus absent. Adnexa within normal limits. Rectovaginal confirmatory. Extremities within normal limits.  Assessment: #1. Breast cancer #2. Uterine cancer #3. Urinary tract infections #4. Nocturia and urgency #5. Atrophic vaginitis #6. Osteopenia with elevated FRAX risk.  Plan: Appropriate lab done. Discussed significance of elevated FRAX risk.  Discussed treatment with Fosamax. Explicit verbal instructions given. We will await to see her lab. She will also check on price of Actonel.

## 2011-07-12 LAB — PTH, INTACT AND CALCIUM
Calcium, Total (PTH): 9.5 mg/dL (ref 8.4–10.5)
PTH: 31.8 pg/mL (ref 14.0–72.0)

## 2011-07-16 ENCOUNTER — Other Ambulatory Visit: Payer: Self-pay | Admitting: *Deleted

## 2011-07-16 MED ORDER — ALENDRONATE SODIUM 70 MG PO TABS: 70.0000 mg | ORAL_TABLET | ORAL | Status: DC

## 2011-07-20 ENCOUNTER — Encounter: Payer: Self-pay | Admitting: Obstetrics and Gynecology

## 2011-07-25 ENCOUNTER — Telehealth: Payer: Self-pay | Admitting: *Deleted

## 2011-07-25 NOTE — Telephone Encounter (Signed)
Pt informed normal pap results 

## 2011-09-28 ENCOUNTER — Encounter (INDEPENDENT_AMBULATORY_CARE_PROVIDER_SITE_OTHER): Payer: Self-pay | Admitting: Surgery

## 2011-12-14 ENCOUNTER — Ambulatory Visit (INDEPENDENT_AMBULATORY_CARE_PROVIDER_SITE_OTHER): Payer: Medicare Other | Admitting: Surgery

## 2011-12-14 ENCOUNTER — Encounter (INDEPENDENT_AMBULATORY_CARE_PROVIDER_SITE_OTHER): Payer: Self-pay | Admitting: Surgery

## 2011-12-14 VITALS — BP 122/80 | HR 82 | Temp 98.6°F | Resp 14 | Ht 65.0 in | Wt 160.6 lb

## 2011-12-14 DIAGNOSIS — Z853 Personal history of malignant neoplasm of breast: Secondary | ICD-10-CM

## 2011-12-14 NOTE — Progress Notes (Signed)
NAME: Ashlee Lopez       DOB: 09-08-1933           DATE: 12/14/2011       MRN: [id number]   Ashlee Lopez is a 78 y.o.Marland Kitchenfemale who presents for routine followup of her Right breast cancer (DCIS with micro invasion) diagnosed in 2004 and treated with right mastectomy. She has no problems or concerns on either side.  PFSH: She has had no significant changes since the last visit here.  ROS: There have been no significant changes since the last visit here  EXAM: General: The patient is alert, oriented, generally healthy appearing, NAD. Mood and affect are normal.  Breasts: The right breast is surgically absent. There is no evidence of recurrence. The left breast is normal to inspection and palpation. There are no suspicious areas noted.   Lymphatics: She has no axillary or supraclavicular adenopathy on either side.  Extremities: Full ROM of the surgical side with no lymphedema noted.  Data Reviewed: Mammogram was negative  Impression: Doing well, with no evidence of recurrent cancer or new cancer  Plan: Will see back PRN. She is to continue annual mammograms

## 2011-12-14 NOTE — Patient Instructions (Signed)
Continue annual mammograms and consider having a 3-D mammogram next time. We will see you again on an as needed basis. Please call the office at [phone] if you have any questions or concerns. Thank you for allowing [place] to take care of you.

## 2012-02-15 ENCOUNTER — Telehealth: Payer: Self-pay | Admitting: *Deleted

## 2012-02-15 NOTE — Telephone Encounter (Signed)
Received call from [facility] & rentals looking for form for mastectomy supplies that was sent for signature sometime back & hasn't been returned.  Asked to refax & this was done & Dr. Cyndie Chime signed & this was refaxed to [phone] & sent to be scanned.

## 2012-03-21 ENCOUNTER — Other Ambulatory Visit (HOSPITAL_BASED_OUTPATIENT_CLINIC_OR_DEPARTMENT_OTHER): Payer: Medicare Other | Admitting: Lab

## 2012-03-21 DIAGNOSIS — Z9081 Acquired absence of spleen: Secondary | ICD-10-CM

## 2012-03-21 DIAGNOSIS — D693 Immune thrombocytopenic purpura: Secondary | ICD-10-CM

## 2012-03-21 DIAGNOSIS — C50419 Malignant neoplasm of upper-outer quadrant of unspecified female breast: Secondary | ICD-10-CM

## 2012-03-21 DIAGNOSIS — C50919 Malignant neoplasm of unspecified site of unspecified female breast: Secondary | ICD-10-CM

## 2012-03-21 LAB — COMPREHENSIVE METABOLIC PANEL (CC13)
ALT: 16 U/L (ref 0–55)
AST: 29 U/L (ref 5–34)
Alkaline Phosphatase: 57 U/L (ref 40–150)
Creatinine: 0.9 mg/dL (ref 0.6–1.1)
Total Bilirubin: 0.62 mg/dL (ref 0.20–1.20)

## 2012-03-21 LAB — CBC WITH DIFFERENTIAL/PLATELET
EOS%: 1 % (ref 0.0–7.0)
MCH: 33.6 pg (ref 25.1–34.0)
MCHC: 35 g/dL (ref 31.5–36.0)
MCV: 95.8 fL (ref 79.5–101.0)
MONO%: 11.4 % (ref 0.0–14.0)
RBC: 3.99 10*6/uL (ref 3.70–5.45)
RDW: 13.3 % (ref 11.2–14.5)

## 2012-03-21 LAB — LACTATE DEHYDROGENASE (CC13): LDH: 194 U/L (ref 125–245)

## 2012-03-24 ENCOUNTER — Ambulatory Visit (HOSPITAL_BASED_OUTPATIENT_CLINIC_OR_DEPARTMENT_OTHER): Payer: Medicare Other | Admitting: Oncology

## 2012-03-24 ENCOUNTER — Telehealth: Payer: Self-pay | Admitting: Oncology

## 2012-03-24 VITALS — BP 183/77 | HR 93 | Temp 98.0°F | Resp 20 | Ht 65.0 in | Wt 163.2 lb

## 2012-03-24 DIAGNOSIS — M810 Age-related osteoporosis without current pathological fracture: Secondary | ICD-10-CM

## 2012-03-24 DIAGNOSIS — Z853 Personal history of malignant neoplasm of breast: Secondary | ICD-10-CM

## 2012-03-24 DIAGNOSIS — C50919 Malignant neoplasm of unspecified site of unspecified female breast: Secondary | ICD-10-CM

## 2012-03-24 NOTE — Patient Instructions (Signed)
Consider twice yearly zometa infusion to improve bone density - give [place] a call and we can schedule.

## 2012-03-24 NOTE — Telephone Encounter (Signed)
Gave pt appt for December 2014  Lab and MD

## 2012-03-25 NOTE — Progress Notes (Signed)
Hematology and Oncology Follow Up Visit  Ashlee Lopez [id number] 21-Dec-1933 79 y.o. 03/25/2012 3:17 PM   Principle Diagnosis: Encounter Diagnoses  Name Primary?  . Breast cancer, stage 1 Yes  . Osteoporosis, post-menopausal      Interim History:   Followup visit for this 79 year old woman diagnosed with stage 1,ER/PR positive, HER-2 negative, lymph node negative,  cancer of the right breast in November 2004. She had multifocal disease. 2.3 x 4.2 x 2.8 cm mass in the medial aspect of the right breast and a 4 mm enhancing focus in the upper outer quadrant. Smaller lesion was invasive carcinoma;  larger lesion was DCIS. Invasive component was strongly ER/PR positive. Due to the multifocal nature of the disease, mastectomy was done 02/17/03. 3 sentinel lymph nodes negative for tumor. The DCIS component was high-grade. Maximum dimension 1.8 cm. Additional areas of abnormality in the breast included areas of sclerosing papilloma. Florid fibrocystic disease. No residual invasive disease and the small invasive component was grade  1. She was treated with 5 years of Aromasin hormonal therapy which was stopped 01/31/09.  She's had no interim medical problems. She tells me that her surgeon and her gynecologist just retired. She is concerned with her bone density. Last study was done in November 2012 it did show she was osteoporotic in her spine. She is taking calcium and vitamin D. She is apprehensive about taking a bisphosphonate. She was taking Fosamax but she didn't like taking it. She has had a previous total hysterectomy and oophorectomy in the past and wonders if she still needs to have pelvic exams?. She has not had any problems with headaches or change in vision, no abdominal pain or change in bowel habit, no bone pain, no vaginal bleeding.  Medications: reviewed  Allergies:  Allergies  Allergen Reactions  . Septra (Bactrim)     Review of Systems: Constitutional:   No constitutional  symptoms Respiratory: No cough or dyspnea Cardiovascular:  No chest pain or palpitations Gastrointestinal: See above Genito-Urinary: See above Musculoskeletal: See above Neurologic: See above Skin: No rash or ecchymosis Remaining ROS negative.  Physical Exam: Blood pressure 183/77, pulse 93, temperature 98 F (36.7 C), temperature source Oral, resp. rate 20, height 5\' 5"  (1.651 m), weight 163 lb 3.2 oz (74.027 kg). Wt Readings from Last 3 Encounters:  03/24/12 163 lb 3.2 oz (74.027 kg)  12/14/11 160 lb 9.6 oz (72.848 kg)  07/11/11 159 lb (72.122 kg)     General appearance: Thin but adequately nourished Caucasian woman HENNT: Pharynx no erythema or exudate Lymph nodes: No cervical, supraclavicular, or axillary adenopathy Breasts: Right mastectomy, no chest wall lesions, no left breast masses  Lungs: clear to auscultation resonant to percussion Heart: Regular rhythm no murmur Abdomen: Soft, nontender, no mass, no organomegaly Extremities: No calf tenderness Vascular: No cyanosis Neurologic: Motor strength 5 over 5, reflexes 2+ symmetric Skin: No rash or ecchymosis  Lab Results: Lab Results  Component Value Date   WBC 6.0 03/21/2012   HGB 13.4 03/21/2012   HCT 38.2 03/21/2012   MCV 95.8 03/21/2012   PLT 263 03/21/2012     Chemistry      Component Value Date/Time   NA 141 03/21/2012 1301   NA 136 01/29/2011 1331   NA 140 01/31/2010 0858   K 4.3 03/21/2012 1301   K 3.9 01/29/2011 1331   K 4.2 01/31/2010 0858   CL 97* 03/21/2012 1301   CL 97 01/29/2011 1331   CL 98 01/31/2010 0858  CO2 26 03/21/2012 1301   CO2 27 01/29/2011 1331   CO2 29 01/31/2010 0858   BUN 15.0 03/21/2012 1301   BUN 11 01/29/2011 1331   BUN 16 01/31/2010 0858   CREATININE 0.9 03/21/2012 1301   CREATININE 0.76 01/29/2011 1331   CREATININE 0.7 01/31/2010 0858      Component Value Date/Time   CALCIUM 9.5 03/21/2012 1301   CALCIUM 9.5 07/11/2011 1030   CALCIUM 9.5 01/29/2011 1331   CALCIUM 9.8 01/31/2010 0858    ALKPHOS 57 03/21/2012 1301   ALKPHOS 60 01/29/2011 1331   ALKPHOS 53 01/31/2010 0858   AST 29 03/21/2012 1301   AST 28 01/29/2011 1331   AST 43* 01/31/2010 0858   ALT 16 03/21/2012 1301   ALT 17 01/29/2011 1331   BILITOT 0.62 03/21/2012 1301   BILITOT 0.5 01/29/2011 1331   BILITOT 0.90 01/31/2010 0858       Radiological Studies: Mammograms done yesterday showed no new disease in the left breast   Impression and Plan: #1. Stage I #1 multifocal invasive and noninvasive cancer the right breast treated as outlined above. She remains free of any obvious new disease now out over 9 years from diagnosis. Plan: Should would like to continue to be followed in our office on an annual basis.  #2. Osteoporosis. We discussed the use of bisphosphonates in particular either Zometa or Reclast whichbe given parenterally once or twice annually. Her sister who also has breast cancer and is my patient and  accompanies her today is taking the every 6 monthly Zometa and tolerating it well. Mrs. Killman wants to take some time to make a decision and she will let us know.  #3. Hypothyroid on replacement. Subsequent to radioactive iodine treatment for Graves' disease  #3. Essential hypertension  #4. History of ITP diagnosed 1986 requiring splenectomy and immunosuppressive drugs in the past. She is up-to-date on her Pneumovax vaccine. She takes an annual flu vaccine as well. #5 irritable bowel syndrome.  #6. Diverticulosis.  #7. History of recurrent urinary tract infections.  #8. Previous appendectomy  #9. Previous ligation of varicose veins right leg  #10. Status post TAH BSO for early stage uterine cancer 2001    CC:Marland Kitchen    Levert Feinstein, MD 1/7/20143:17 PM

## 2012-12-22 ENCOUNTER — Telehealth: Payer: Self-pay | Admitting: Oncology

## 2012-12-22 NOTE — Telephone Encounter (Signed)
Gave pt appt for lab and MD on January 2015 per pt rqst

## 2013-01-28 ENCOUNTER — Other Ambulatory Visit: Payer: Self-pay | Admitting: Gastroenterology

## 2013-03-13 ENCOUNTER — Other Ambulatory Visit: Payer: Medicare Other | Admitting: Lab

## 2013-03-17 ENCOUNTER — Ambulatory Visit: Payer: Medicare Other | Admitting: Oncology

## 2013-03-20 ENCOUNTER — Other Ambulatory Visit (HOSPITAL_BASED_OUTPATIENT_CLINIC_OR_DEPARTMENT_OTHER): Payer: Medicare Other

## 2013-03-20 DIAGNOSIS — M810 Age-related osteoporosis without current pathological fracture: Secondary | ICD-10-CM

## 2013-03-20 DIAGNOSIS — C50919 Malignant neoplasm of unspecified site of unspecified female breast: Secondary | ICD-10-CM

## 2013-03-20 DIAGNOSIS — Z853 Personal history of malignant neoplasm of breast: Secondary | ICD-10-CM

## 2013-03-20 LAB — COMPREHENSIVE METABOLIC PANEL (CC13)
ALBUMIN: 4.1 g/dL (ref 3.5–5.0)
ALT: 23 U/L (ref 0–55)
ANION GAP: 10 meq/L (ref 3–11)
AST: 31 U/L (ref 5–34)
Alkaline Phosphatase: 59 U/L (ref 40–150)
BUN: 12 mg/dL (ref 7.0–26.0)
CALCIUM: 9.8 mg/dL (ref 8.4–10.4)
CO2: 27 meq/L (ref 22–29)
Chloride: 96 mEq/L — ABNORMAL LOW (ref 98–109)
Creatinine: 0.7 mg/dL (ref 0.6–1.1)
Glucose: 102 mg/dl (ref 70–140)
POTASSIUM: 3.6 meq/L (ref 3.5–5.1)
SODIUM: 133 meq/L — AB (ref 136–145)
TOTAL PROTEIN: 7.5 g/dL (ref 6.4–8.3)
Total Bilirubin: 0.78 mg/dL (ref 0.20–1.20)

## 2013-03-20 LAB — CBC WITH DIFFERENTIAL/PLATELET
BASO%: 0.6 % (ref 0.0–2.0)
BASOS ABS: 0.1 10*3/uL (ref 0.0–0.1)
EOS%: 1.9 % (ref 0.0–7.0)
Eosinophils Absolute: 0.2 10*3/uL (ref 0.0–0.5)
HEMATOCRIT: 40 % (ref 34.8–46.6)
HGB: 13.5 g/dL (ref 11.6–15.9)
LYMPH%: 13.5 % — AB (ref 14.0–49.7)
MCH: 32.5 pg (ref 25.1–34.0)
MCHC: 33.8 g/dL (ref 31.5–36.0)
MCV: 96.1 fL (ref 79.5–101.0)
MONO#: 1.1 10*3/uL — ABNORMAL HIGH (ref 0.1–0.9)
MONO%: 12.5 % (ref 0.0–14.0)
NEUT#: 6.2 10*3/uL (ref 1.5–6.5)
NEUT%: 71.5 % (ref 38.4–76.8)
Platelets: 285 10*3/uL (ref 145–400)
RBC: 4.16 10*6/uL (ref 3.70–5.45)
RDW: 13.7 % (ref 11.2–14.5)
WBC: 8.6 10*3/uL (ref 3.9–10.3)
lymph#: 1.2 10*3/uL (ref 0.9–3.3)

## 2013-03-21 LAB — VITAMIN D 25 HYDROXY (VIT D DEFICIENCY, FRACTURES): VIT D 25 HYDROXY: 46 ng/mL (ref 30–89)

## 2013-03-24 ENCOUNTER — Encounter (INDEPENDENT_AMBULATORY_CARE_PROVIDER_SITE_OTHER): Payer: Self-pay

## 2013-03-24 ENCOUNTER — Ambulatory Visit (HOSPITAL_BASED_OUTPATIENT_CLINIC_OR_DEPARTMENT_OTHER): Payer: Medicare Other | Admitting: Oncology

## 2013-03-24 ENCOUNTER — Telehealth: Payer: Self-pay | Admitting: Oncology

## 2013-03-24 VITALS — BP 207/94 | HR 88 | Temp 96.8°F | Resp 19 | Ht 65.0 in | Wt 158.9 lb

## 2013-03-24 DIAGNOSIS — C50912 Malignant neoplasm of unspecified site of left female breast: Secondary | ICD-10-CM

## 2013-03-24 DIAGNOSIS — E038 Other specified hypothyroidism: Secondary | ICD-10-CM | POA: Diagnosis not present

## 2013-03-24 DIAGNOSIS — Z9081 Acquired absence of spleen: Secondary | ICD-10-CM

## 2013-03-24 DIAGNOSIS — Z8541 Personal history of malignant neoplasm of cervix uteri: Secondary | ICD-10-CM | POA: Diagnosis not present

## 2013-03-24 DIAGNOSIS — Z853 Personal history of malignant neoplasm of breast: Secondary | ICD-10-CM | POA: Diagnosis not present

## 2013-03-24 DIAGNOSIS — D693 Immune thrombocytopenic purpura: Secondary | ICD-10-CM

## 2013-03-24 NOTE — Telephone Encounter (Signed)
Dr Beryle Beams put in a POF indicating patient could return prn shh

## 2013-03-25 NOTE — Progress Notes (Signed)
Hematology and Oncology Follow Up Visit  Ashlee Lopez [id number] Aug 05, 1933 80 y.o. 03/25/2013 2:46 PM   Principle Diagnosis: Encounter Diagnoses  Name Primary?  . Breast cancer, stage 1, left Yes  . ITP (idiopathic thrombocytopenic purpura)   . S/P splenectomy      Interim History:   This is a landmark visit for this pleasant 80 year old who is just past the 10 year mark from the diagnosis of a stage I, ER positive, HER-2-negative, cancer of the right breast in November 2004. She had multifocal disease. 2.3 x 4.2 x 2.8 cm mass in the medial aspect of the right breast and a 4 mm enhancing focus in the upper outer quadrant. Smaller lesion was invasive carcinoma larger lesion was DCIS. Invasive component was strongly ER/PR positive. Due to the multifocal nature of the disease, mastectomy was done 02/17/03. 3 sentinel lymph nodes negative for tumor. The DCIS component was high-grade. Maximum dimension 1.8 cm. Additional areas of abnormality in the breast including areas of sclerosing papilloma. [place] fibrocystic disease. No residual invasive disease in the small invasive component and grade was 1. She was treated with 5 years of Aromasin hormonal therapy which was stopped 01/31/09.  She is doing well at this time. She's had no interim medical problems. She denies any bone pain. No headache or change in vision. No vaginal bleeding or discharge. She has a history of TAH/BSO in the past in 2001 for early uterine cancer. Most recent GYN exam done 07/11/2011 was unremarkable. Her gynecologist is recently retired.  She has a history of ITP status post splenectomy approximately 1986. She has had a complete response and platelet count remains normal at this time.   Medications: reviewed  Allergies:  Allergies  Allergen Reactions  . Septra [Bactrim]     Review of Systems: Hematology: No bleeding or bruising ENT ROS: No sore throat Breast ROS: No breast lumps Respiratory ROS: No cough or  dyspnea Cardiovascular ROS:   No chest pain or palpitations Gastrointestinal ROS:   No abdominal pain or change in bowel habit Genito-Urinary ROS: Recent UTI treated. Denies any vaginal bleeding or discharge. Musculoskeletal ROS: No muscle bone or joint pain Neurological ROS: No headache or change in vision Dermatological ROS: No rash Remaining ROS negative  Physical Exam: Blood pressure 207/94, pulse 88, temperature 96.8 F (36 C), temperature source Oral, resp. rate 19, height _0  (1.651 m), weight 158 lb 14.4 oz (72.077 kg). Wt Readings from Last 3 Encounters:  03/24/13 158 lb 14.4 oz (72.077 kg)  03/24/12 163 lb 3.2 oz (74.027 kg)  12/14/11 160 lb 9.6 oz (72.848 kg)   repeat blood pressure was 170/80   General appearance: Well-nourished Caucasian woman HENNT: Pharynx no erythema, exudate, mass, or ulcer. No thyromegaly or thyroid nodules. Ear exam shows tympanic membranes partially occluded by wax bilaterally but no signs of erythema or exudate. Lymph nodes: No cervical, supraclavicular, or axillary lymphadenopathy Breasts: Right t mastectomy. No left breast masses. No chest wall lesions. Lungs: Clear to auscultation, resonant to percussion throughout Heart: Regular rhythm, no murmur, no gallop, no rub, no click, no edema Abdomen: Soft, nontender, normal bowel sounds, no mass, no organomegaly Extremities: No edema, no calf tenderness Musculoskeletal: no joint deformities GU: Vascular: Carotid pulses 2+, no bruits, Neurologic: Alert, oriented, PERRLA,  cranial nerves grossly normal, motor strength 5 over 5, reflexes 1+ symmetric, upper body coordination normal, gait normal, Skin: No rash or ecchymosis  Lab Results: CBC W/Diff    Component Value Date/Time  WBC 8.6 03/20/2013 1030   WBC 4.3 01/31/2010 0858   RBC 4.16 03/20/2013 1030   HGB 13.5 03/20/2013 1030   HGB 13.4 01/31/2010 0858   HCT 40.0 03/20/2013 1030   HCT 38.6 01/31/2010 0858   PLT 285 03/20/2013 1030   PLT 281  01/31/2010 0858   MCV 96.1 03/20/2013 1030   MCV 93 01/31/2010 0858   MCH 32.5 03/20/2013 1030   MCH 32.2 01/31/2010 0858   MCHC 33.8 03/20/2013 1030   MCHC 34.7 01/31/2010 0858   RDW 13.7 03/20/2013 1030   RDW 11.5 01/31/2010 0858   LYMPHSABS 1.2 03/20/2013 1030   LYMPHSABS 1.2 01/31/2010 0858   MONOABS 1.1* 03/20/2013 1030   EOSABS 0.2 03/20/2013 1030   EOSABS 0.1 01/31/2010 0858   BASOSABS 0.1 03/20/2013 1030   BASOSABS 0.0 01/31/2010 0858     Chemistry      Component Value Date/Time   NA 133* 03/20/2013 1030   NA 136 01/29/2011 1331   NA 140 01/31/2010 0858   K 3.6 03/20/2013 1030   K 3.9 01/29/2011 1331   K 4.2 01/31/2010 0858   CL 97* 03/21/2012 1301   CL 97 01/29/2011 1331   CL 98 01/31/2010 0858   CO2 27 03/20/2013 1030   CO2 27 01/29/2011 1331   CO2 29 01/31/2010 0858   BUN 12.0 03/20/2013 1030   BUN 11 01/29/2011 1331   BUN 16 01/31/2010 0858   CREATININE 0.7 03/20/2013 1030   CREATININE 0.76 01/29/2011 1331   CREATININE 0.7 01/31/2010 0858      Component Value Date/Time   CALCIUM 9.8 03/20/2013 1030   CALCIUM 9.5 07/11/2011 1030   CALCIUM 9.5 01/29/2011 1331   CALCIUM 9.8 01/31/2010 0858   ALKPHOS 59 03/20/2013 1030   ALKPHOS 60 01/29/2011 1331   ALKPHOS 53 01/31/2010 0858   AST 31 03/20/2013 1030   AST 28 01/29/2011 1331   AST 43* 01/31/2010 0858   ALT 23 03/20/2013 1030   ALT 17 01/29/2011 1331   ALT 31 01/31/2010 0858   BILITOT 0.78 03/20/2013 1030   BILITOT 0.5 01/29/2011 1331   BILITOT 0.90 01/31/2010 0858       Radiological Studies: Mammogram was scheduled yesterday. She was called by radiology. They were busy and asked if she would reschedule for later this week.   Impression:  #1. Stage I multifocal DCIS and invasive breast cancer right breast treated as outlined above. She remains free of any obvious new disease now out over 10 years from diagnosis.  continue annual exam and mammogram.  Itold her I felt comfortable letting her graduate from our practice at this  time.  #2. Hypothyroid on replacement. Subsequent to radioactive iodine treatment for Graves' disease   #3. Essential hypertension   #4. History of ITP diagnosed 1986 requiring splenectomy and immunosuppressive drugs in the past.   #5 irritable bowel syndrome.   #6. Diverticulosis.   #7. History of recurrent urinary tract infections.   #8. Previous appendectomy   #9. Previous ligation of varicose veins right leg   #10. Status post TAH BSO for early stage uterine cancer 2001 She wants no she really needs to have ongoing GYN exams. If so probably this was not necessary but to let somebody know if she develops any vaginal bleeding.    CC: Patient Care Team: Morton Peters., MD as PCP - General (Unknown Physician Specialty) Annia Belt, MD (Hematology and Oncology)   Annia Belt, MD 1/7/20152:46 PM

## 2013-03-26 ENCOUNTER — Telehealth: Payer: Self-pay | Admitting: *Deleted

## 2013-03-26 NOTE — Telephone Encounter (Addendum)
Received vm call from pt asking for a copy of her labs done this week.  If OK with Dr Beryle Beams will mail to pt.  OK received from Dr. Beryle Beams & copy of labs mailed to pt.

## 2013-03-31 DIAGNOSIS — M899 Disorder of bone, unspecified: Secondary | ICD-10-CM | POA: Diagnosis not present

## 2013-03-31 DIAGNOSIS — Z1231 Encounter for screening mammogram for malignant neoplasm of breast: Secondary | ICD-10-CM | POA: Diagnosis not present

## 2013-03-31 DIAGNOSIS — M949 Disorder of cartilage, unspecified: Secondary | ICD-10-CM | POA: Diagnosis not present

## 2013-04-22 ENCOUNTER — Telehealth: Payer: Self-pay | Admitting: *Deleted

## 2013-04-22 NOTE — Telephone Encounter (Signed)
Pt is calling for results of her bone density.  She had this done at [facility]  Will look for result.

## 2013-04-23 ENCOUNTER — Telehealth: Payer: Self-pay | Admitting: *Deleted

## 2013-04-23 NOTE — Telephone Encounter (Signed)
Informed pt of Bone density report; OK per Dr Beryle Beams; no statistically significan change in bone mineral density of left forearm or bilateral hips & recommendations: to f/u in 2 yrs, maintain adequate dietary intake of calcium & vit D; wt bearing exercise as tol.

## 2013-05-16 ENCOUNTER — Encounter: Payer: Self-pay | Admitting: Oncology

## 2013-05-16 ENCOUNTER — Telehealth: Payer: Self-pay | Admitting: Oncology

## 2013-05-16 NOTE — Telephone Encounter (Signed)
, °

## 2013-05-19 DIAGNOSIS — E039 Hypothyroidism, unspecified: Secondary | ICD-10-CM | POA: Diagnosis not present

## 2013-05-19 DIAGNOSIS — E785 Hyperlipidemia, unspecified: Secondary | ICD-10-CM | POA: Diagnosis not present

## 2013-05-19 DIAGNOSIS — I10 Essential (primary) hypertension: Secondary | ICD-10-CM | POA: Diagnosis not present

## 2013-05-28 DIAGNOSIS — N390 Urinary tract infection, site not specified: Secondary | ICD-10-CM | POA: Diagnosis not present

## 2013-06-04 DIAGNOSIS — Z85828 Personal history of other malignant neoplasm of skin: Secondary | ICD-10-CM | POA: Diagnosis not present

## 2013-06-04 DIAGNOSIS — L819 Disorder of pigmentation, unspecified: Secondary | ICD-10-CM | POA: Diagnosis not present

## 2013-06-04 DIAGNOSIS — L820 Inflamed seborrheic keratosis: Secondary | ICD-10-CM | POA: Diagnosis not present

## 2013-06-12 ENCOUNTER — Telehealth: Payer: Self-pay | Admitting: Hematology and Oncology

## 2013-06-12 NOTE — Telephone Encounter (Signed)
FORMER PATIENT OF DR. Darnell Level REASSIGN TO DR. Alvy Bimler.

## 2013-08-18 DIAGNOSIS — I10 Essential (primary) hypertension: Secondary | ICD-10-CM | POA: Diagnosis not present

## 2013-08-18 DIAGNOSIS — E785 Hyperlipidemia, unspecified: Secondary | ICD-10-CM | POA: Diagnosis not present

## 2013-08-18 DIAGNOSIS — E039 Hypothyroidism, unspecified: Secondary | ICD-10-CM | POA: Diagnosis not present

## 2013-10-02 DIAGNOSIS — N390 Urinary tract infection, site not specified: Secondary | ICD-10-CM | POA: Diagnosis not present

## 2013-11-17 DIAGNOSIS — E785 Hyperlipidemia, unspecified: Secondary | ICD-10-CM | POA: Diagnosis not present

## 2013-11-17 DIAGNOSIS — T887XXA Unspecified adverse effect of drug or medicament, initial encounter: Secondary | ICD-10-CM | POA: Diagnosis not present

## 2013-11-17 DIAGNOSIS — E039 Hypothyroidism, unspecified: Secondary | ICD-10-CM | POA: Diagnosis not present

## 2013-11-17 DIAGNOSIS — I10 Essential (primary) hypertension: Secondary | ICD-10-CM | POA: Diagnosis not present

## 2013-12-10 DIAGNOSIS — H2510 Age-related nuclear cataract, unspecified eye: Secondary | ICD-10-CM | POA: Diagnosis not present

## 2013-12-14 DIAGNOSIS — Z23 Encounter for immunization: Secondary | ICD-10-CM | POA: Diagnosis not present

## 2014-01-18 ENCOUNTER — Encounter (INDEPENDENT_AMBULATORY_CARE_PROVIDER_SITE_OTHER): Payer: Self-pay | Admitting: Surgery

## 2014-02-16 DIAGNOSIS — E039 Hypothyroidism, unspecified: Secondary | ICD-10-CM | POA: Diagnosis not present

## 2014-02-16 DIAGNOSIS — I10 Essential (primary) hypertension: Secondary | ICD-10-CM | POA: Diagnosis not present

## 2014-02-16 DIAGNOSIS — E785 Hyperlipidemia, unspecified: Secondary | ICD-10-CM | POA: Diagnosis not present

## 2014-04-01 DIAGNOSIS — Z1231 Encounter for screening mammogram for malignant neoplasm of breast: Secondary | ICD-10-CM | POA: Diagnosis not present

## 2014-04-01 DIAGNOSIS — Z853 Personal history of malignant neoplasm of breast: Secondary | ICD-10-CM | POA: Diagnosis not present

## 2014-06-15 DIAGNOSIS — I10 Essential (primary) hypertension: Secondary | ICD-10-CM | POA: Diagnosis not present

## 2014-06-15 DIAGNOSIS — E785 Hyperlipidemia, unspecified: Secondary | ICD-10-CM | POA: Diagnosis not present

## 2014-06-15 DIAGNOSIS — D500 Iron deficiency anemia secondary to blood loss (chronic): Secondary | ICD-10-CM | POA: Diagnosis not present

## 2014-06-15 DIAGNOSIS — E789 Disorder of lipoprotein metabolism, unspecified: Secondary | ICD-10-CM | POA: Diagnosis not present

## 2014-07-28 DIAGNOSIS — L820 Inflamed seborrheic keratosis: Secondary | ICD-10-CM | POA: Diagnosis not present

## 2014-07-28 DIAGNOSIS — L853 Xerosis cutis: Secondary | ICD-10-CM | POA: Diagnosis not present

## 2014-09-14 DIAGNOSIS — I10 Essential (primary) hypertension: Secondary | ICD-10-CM | POA: Diagnosis not present

## 2014-09-14 DIAGNOSIS — E785 Hyperlipidemia, unspecified: Secondary | ICD-10-CM | POA: Diagnosis not present

## 2014-10-01 DIAGNOSIS — N390 Urinary tract infection, site not specified: Secondary | ICD-10-CM | POA: Diagnosis not present

## 2014-10-25 DIAGNOSIS — N390 Urinary tract infection, site not specified: Secondary | ICD-10-CM | POA: Diagnosis not present

## 2014-12-14 DIAGNOSIS — E785 Hyperlipidemia, unspecified: Secondary | ICD-10-CM | POA: Diagnosis not present

## 2014-12-14 DIAGNOSIS — I10 Essential (primary) hypertension: Secondary | ICD-10-CM | POA: Diagnosis not present

## 2014-12-23 DIAGNOSIS — H2513 Age-related nuclear cataract, bilateral: Secondary | ICD-10-CM | POA: Diagnosis not present

## 2014-12-27 DIAGNOSIS — Z23 Encounter for immunization: Secondary | ICD-10-CM | POA: Diagnosis not present

## 2015-01-05 DIAGNOSIS — I10 Essential (primary) hypertension: Secondary | ICD-10-CM | POA: Diagnosis not present

## 2015-04-06 DIAGNOSIS — M85852 Other specified disorders of bone density and structure, left thigh: Secondary | ICD-10-CM | POA: Diagnosis not present

## 2015-04-06 DIAGNOSIS — Z1231 Encounter for screening mammogram for malignant neoplasm of breast: Secondary | ICD-10-CM | POA: Diagnosis not present

## 2015-04-13 DIAGNOSIS — N3000 Acute cystitis without hematuria: Secondary | ICD-10-CM | POA: Diagnosis not present

## 2015-04-13 DIAGNOSIS — R300 Dysuria: Secondary | ICD-10-CM | POA: Diagnosis not present

## 2015-05-10 ENCOUNTER — Encounter: Payer: Self-pay | Admitting: Primary Care

## 2015-05-11 ENCOUNTER — Telehealth: Payer: Self-pay | Admitting: Primary Care

## 2015-05-11 ENCOUNTER — Ambulatory Visit (INDEPENDENT_AMBULATORY_CARE_PROVIDER_SITE_OTHER): Payer: Medicare Other | Admitting: Primary Care

## 2015-05-11 ENCOUNTER — Encounter: Payer: Self-pay | Admitting: Primary Care

## 2015-05-11 VITALS — BP 122/70 | HR 68 | Temp 97.5°F | Ht 64.25 in | Wt 158.4 lb

## 2015-05-11 DIAGNOSIS — D693 Immune thrombocytopenic purpura: Secondary | ICD-10-CM

## 2015-05-11 DIAGNOSIS — E785 Hyperlipidemia, unspecified: Secondary | ICD-10-CM

## 2015-05-11 DIAGNOSIS — I10 Essential (primary) hypertension: Secondary | ICD-10-CM | POA: Diagnosis not present

## 2015-05-11 DIAGNOSIS — E039 Hypothyroidism, unspecified: Secondary | ICD-10-CM | POA: Diagnosis not present

## 2015-05-11 DIAGNOSIS — F411 Generalized anxiety disorder: Secondary | ICD-10-CM | POA: Diagnosis not present

## 2015-05-11 DIAGNOSIS — Z862 Personal history of diseases of the blood and blood-forming organs and certain disorders involving the immune mechanism: Secondary | ICD-10-CM

## 2015-05-11 LAB — CBC
HCT: 41.5 % (ref 36.0–46.0)
Hemoglobin: 14 g/dL (ref 12.0–15.0)
MCHC: 33.7 g/dL (ref 30.0–36.0)
MCV: 94.4 fl (ref 78.0–100.0)
Platelets: 288 10*3/uL (ref 150.0–400.0)
RBC: 4.4 Mil/uL (ref 3.87–5.11)
RDW: 13 % (ref 11.5–15.5)
WBC: 10.1 10*3/uL (ref 4.0–10.5)

## 2015-05-11 MED ORDER — SERTRALINE HCL 25 MG PO TABS: 25.0000 mg | ORAL_TABLET | Freq: Every day | ORAL | Status: DC

## 2015-05-11 NOTE — Telephone Encounter (Signed)
Please notify Ashlee Lopez that i've reviewed her records and they are ready for pick up at her convenience.

## 2015-05-11 NOTE — Progress Notes (Signed)
Subjective:    Patient ID: Ashlee Lopez, female    DOB: 31-Jan-1934, 82 y.o.   MRN: [id number]  HPI  Ashlee Lopez is a 82 year old female who presents today to establish care and discuss the problems mentioned below. Will obtain old records. Her last physical was years ago.  1) Essential Hypertension: Diagnosed years ago. Currently managed on Amlodipine 5 mg, HCTZ 25 mg, and Lopressor 50 mg BID. Denies chest pain, headaches. She does not check her blood pressure at home. BP stable in clinic today.   2) Hyperlipidemia: Currently managed on Crestor 5 mg every other day. Her last lipid panel was normal in October 2016 (per labs that she brought today).  3) Hypothyroidism: Currently managed on levothyroxine 100 mcg. Last TSH in October 2016 of 4.8. Her medication was not adjusted in October 2016. Denies symptoms.  4) Diverticulosis: Managed with [facility]. Last colonoscopy was in 2014, with one poylp, was told she didn't need another colonoscopy. No recent diverticulitis flares.  5) Generalized Anxiety Disorder: Anxious most of her life. GAD 7 score of 14. She is managed on Alprazolam 0.5 mg twice daily for several years. She takes this medication around the clock. She feels as though the xanax does help her but continues to notice anxiety despite treatment. She's never been managed on SSRI's or other treatment for anxiety.   Review of Systems  Respiratory: Negative for shortness of breath.   Cardiovascular: Negative for chest pain.  Gastrointestinal: Negative for diarrhea.  Endocrine: Negative for cold intolerance.  Neurological: Negative for dizziness and headaches.  Psychiatric/Behavioral: Negative for suicidal ideas and sleep disturbance. The patient is nervous/anxious.        Past Medical History  Diagnosis Date  . ITP (idiopathic thrombocytopenic purpura) 03/21/2011  . S/P splenectomy 03/21/2011  . Benign essential HTN 03/21/2011  . Hypothyroid 03/21/2011  . Graves' disease with exophthalmos  03/23/2011  . Diverticulosis of colon 03/23/2011  . IBS (irritable bowel syndrome) 03/23/2011  . Fibrocystic disease of breast 03/23/2011  . Varicose vein of leg 03/23/2011  . Hypertension   . Cancer ([place])   . Breast cancer, stage 1 ([place]) 02/10/2003    Right tubular breast cancer  . Uterus cancer ([place]) 03/23/1999    Well differentiated AdenoCA of endometrium-superficially confined    Social History   Social History  . Marital Status: Widowed    Spouse Name: N/A  . Number of Children: N/A  . Years of Education: N/A   Occupational History  . Not on file.   Social History Main Topics  . Smoking status: Never Smoker   . Smokeless tobacco: Not on file  . Alcohol Use: No  . Drug Use: No  . Sexual Activity: No   Other Topics Concern  . Not on file   Social History Narrative   Widow. Lives alone.    3 children, 5 grandchildren.   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   Enjoys reading, watching TV.    Past Surgical History  Procedure Laterality Date  . Appendectomy  1941  . Splenectomy  1986  . Abdominal hysterectomy  2001  . Mastectomy  2004    Dr Margot Chimes  . Oophorectomy      BSO  . Breast surgery  02/17/2003    Mastectomy-Right    Family History  Problem Relation Age of Onset  . Breast cancer Mother     Age 88  . Hypertension Father   . Heart disease Father   .  Lung cancer Father   . Breast cancer Sister     Age 99  . Dementia Sister     Allergies  Allergen Reactions  . Sulfamethoxazole-Trimethoprim Hives  . Septra [Bactrim]     Current Outpatient Prescriptions on File Prior to Visit  Medication Sig Dispense Refill  . ALPRAZolam (XANAX) 0.5 MG tablet Take 0.5 mg by mouth 2 (two) times daily.     Marland Kitchen amLODipine (NORVASC) 5 MG tablet daily.     Marland Kitchen aspirin 81 MG tablet Take 81 mg by mouth daily.      Marland Kitchen CALCIUM-VITAMIN D PO Take by mouth.      . Diphenhydramine-APAP, sleep, (TYLENOL PM EXTRA STRENGTH PO) Take by mouth.      . hydrochlorothiazide 25 MG tablet Take 25  mg by mouth daily.      Marland Kitchen levothyroxine (SYNTHROID, LEVOTHROID) 100 MCG tablet Take 100 mcg by mouth daily.      Marland Kitchen METOPROLOL TARTRATE PO Take 50 mg by mouth 2 (two) times daily.     . Multiple Vitamin (MULTI-VITAMIN PO) Take by mouth.      . ranitidine (ZANTAC) 150 MG tablet     . rosuvastatin (CRESTOR) 5 MG tablet Take 5 mg by mouth every other day.      No current facility-administered medications on file prior to visit.    BP 122/70 mmHg  Pulse 68  Temp(Src) 97.5 F (36.4 C) (Oral)  Ht 5' 4.25" (1.632 m)  Wt 158 lb 6.4 oz (71.85 kg)  BMI 26.98 kg/m2  SpO2 97%    Objective:   Physical Exam  Constitutional: She is oriented to person, place, and time. She appears well-nourished.  Cardiovascular: Normal rate and regular rhythm.   Pulmonary/Chest: Effort normal and breath sounds normal.  Neurological: She is alert and oriented to person, place, and time.  Skin: Skin is warm and dry.  Psychiatric: She has a normal mood and affect.          Assessment & Plan:

## 2015-05-11 NOTE — Patient Instructions (Signed)
Start Zoloft (Sertraline) 25 mg tablets for anxiety. Take 1/2 tablet by mouth everyday for 6 days, then advance to 1 full tablet thereafter. This medication is to be taken everyday.  Try to only use the Alprazolam as needed for panic attacks or increased anxiety.   Complete lab work prior to leaving today. I will notify you of your results once received.   Follow up in 6 weeks for re-evaluation of anxiety.  Please schedule a physical with me in 6 months. You may also schedule a lab only appointment 3-4 days prior. We will discuss your lab results in detail during your physical.  It was a pleasure to meet you today! Please don't hesitate to call me with any questions. Welcome to [facility]!

## 2015-05-11 NOTE — Assessment & Plan Note (Signed)
No recent flares. Last colonoscopy in 2014 with one polyp.  Managed by [facility].

## 2015-05-11 NOTE — Assessment & Plan Note (Signed)
Long standing history. GAD 7 score of 14 today. Will have her hold Xanax and use PRN. Start low dose Zoloft.  Patient is to take 1/2 tablet daily for 6 days, then advance to 1 full tablet thereafter. We discussed possible side effects of headache, GI upset, drowsiness, and SI/HI. If thoughts of SI/HI develop, we discussed to present to the emergency immediately. Patient verbalized understanding.   Follow up in 6 weeks.

## 2015-05-11 NOTE — Progress Notes (Signed)
Pre visit review using our clinic review tool, if applicable. No additional management support is needed unless otherwise documented below in the visit note. 

## 2015-05-11 NOTE — Assessment & Plan Note (Signed)
Managed on levothyroxine 100 mcg. TSH of 4.8 in 12/2014. No recent symptoms. Will obtain TSH today which is pending.

## 2015-05-11 NOTE — Assessment & Plan Note (Signed)
Managed on HCTZ, metoprolol, amlodipine. BP stable. Continue current regimen.

## 2015-05-11 NOTE — Assessment & Plan Note (Signed)
No problems in years. Will check CBC today.

## 2015-05-11 NOTE — Assessment & Plan Note (Signed)
Managed on crestor 5 mg every other day. Lipid panel in October 2016 stable. Will repeat in October 2017.

## 2015-05-12 ENCOUNTER — Encounter: Payer: Self-pay | Admitting: *Deleted

## 2015-05-12 DIAGNOSIS — Z1211 Encounter for screening for malignant neoplasm of colon: Secondary | ICD-10-CM | POA: Diagnosis not present

## 2015-05-12 LAB — TSH: TSH: 3.12 u[IU]/mL (ref 0.35–4.50)

## 2015-05-12 NOTE — Telephone Encounter (Signed)
Called and notified patient of Kate's comments. Patient verbalized understanding.  

## 2015-06-15 ENCOUNTER — Encounter: Payer: Self-pay | Admitting: Family Medicine

## 2015-06-15 ENCOUNTER — Ambulatory Visit (INDEPENDENT_AMBULATORY_CARE_PROVIDER_SITE_OTHER): Payer: Medicare Other | Admitting: Family Medicine

## 2015-06-15 VITALS — BP 128/76 | HR 88 | Temp 98.1°F | Ht 65.0 in | Wt 157.6 lb

## 2015-06-15 DIAGNOSIS — N3000 Acute cystitis without hematuria: Secondary | ICD-10-CM | POA: Diagnosis not present

## 2015-06-15 DIAGNOSIS — R05 Cough: Secondary | ICD-10-CM

## 2015-06-15 DIAGNOSIS — N390 Urinary tract infection, site not specified: Secondary | ICD-10-CM | POA: Insufficient documentation

## 2015-06-15 DIAGNOSIS — R300 Dysuria: Secondary | ICD-10-CM

## 2015-06-15 DIAGNOSIS — R350 Frequency of micturition: Secondary | ICD-10-CM | POA: Diagnosis not present

## 2015-06-15 DIAGNOSIS — R051 Acute cough: Secondary | ICD-10-CM | POA: Insufficient documentation

## 2015-06-15 DIAGNOSIS — R059 Cough, unspecified: Secondary | ICD-10-CM | POA: Insufficient documentation

## 2015-06-15 LAB — POCT URINALYSIS DIPSTICK
Bilirubin, UA: NEGATIVE
Blood, UA: 10
Glucose, UA: NEGATIVE
KETONES UA: NEGATIVE
NITRITE UA: NEGATIVE
PH UA: 8
Spec Grav, UA: 1.015
Urobilinogen, UA: 0.2

## 2015-06-15 MED ORDER — CEPHALEXIN 500 MG PO CAPS: 500.0000 mg | ORAL_CAPSULE | Freq: Two times a day (BID) | ORAL | Status: DC

## 2015-06-15 NOTE — Progress Notes (Signed)
Patient ID: Ashlee Lopez, female   DOB: Apr 13, 1933, 82 y.o.   MRN: [id number]  Tommi Rumps, MD Phone: [phone]  Ashlee Lopez is a 82 y.o. female who presents today for same-day visit.  UTI: Patient believes she has UTI. She has had increasing frequency and urgency since Saturday. No dysuria. No fevers. No chills. Mild suprapubic discomfort yesterday though no other abdominal pain. No diarrhea or vomiting. No vaginal discharge. States this feels like her previous UTIs.  Cough: Patient notes for the last 2 days she has had some mild rhinorrhea and postnasal drip with minimally productive cough. Notes may be having had a cold sweat last night. No fevers. No sick contacts. No shortness of breath. No body aches. She overall feels okay. She's been using Coricidin for this.  PMH: nonsmoker.   ROS see history of present illness  Objective  Physical Exam Filed Vitals:   06/15/15 1415  BP: 128/76  Pulse: 88  Temp: 98.1 F (36.7 C)    BP Readings from Last 3 Encounters:  06/15/15 128/76  05/11/15 122/70  03/24/13 207/94   Wt Readings from Last 3 Encounters:  06/15/15 157 lb 9.6 oz (71.487 kg)  05/11/15 158 lb 6.4 oz (71.85 kg)  03/24/13 158 lb 14.4 oz (72.077 kg)    Physical Exam  Constitutional: She is well-developed, well-nourished, and in no distress.  HENT:  Head: Normocephalic and atraumatic.  Right Ear: External ear normal.  Left Ear: External ear normal.  Mouth/Throat: No oropharyngeal exudate.  Normal TMs bilaterally, moist mucous membranes, postnasal drip noted in oropharynx  Eyes: Conjunctivae are normal. Pupils are equal, round, and reactive to light.  Neck: Neck supple.  Cardiovascular: Normal rate, regular rhythm and normal heart sounds.  Exam reveals no gallop and no friction rub.   No murmur heard. Pulmonary/Chest: Effort normal and breath sounds normal. No respiratory distress. She has no wheezes. She has no rales.  Abdominal: Soft. Bowel sounds are  normal. She exhibits no distension. There is no tenderness. There is no rebound and no guarding.  Lymphadenopathy:    She has no cervical adenopathy.  Neurological: She is alert. Gait normal.  Skin: Skin is warm and dry. She is not diaphoretic.     Assessment/Plan: Please see individual problem list.  UTI (urinary tract infection) Symptoms and UA concerning for UTI. Benign abdominal exam. Vital signs stable. We'll treat with Keflex. Send urine for culture. Not enough to send for microscopy. She'll continue to monitor. She is given return precautions.  Cough Suspect this is related to allergies versus viral illness given postnasal drip and rhinorrhea. No focal findings on exam to indicate a bacterial illness. Benign lung exam. Vital signs are stable. She will trial over-the-counter Claritin or Flonase for her symptoms. She'll continue to monitor. She is given return precautions.    Orders Placed This Encounter  Procedures  . Urine Culture  . POCT Urinalysis Dipstick    Meds ordered this encounter  Medications  . cephALEXin (KEFLEX) 500 MG capsule    Sig: Take 1 capsule (500 mg total) by mouth 2 (two) times daily.    Dispense:  14 capsule    Refill:  0   Tommi Rumps, MD [place]

## 2015-06-15 NOTE — Progress Notes (Signed)
Pre visit review using our clinic review tool, if applicable. No additional management support is needed unless otherwise documented below in the visit note. 

## 2015-06-15 NOTE — Patient Instructions (Signed)
Nice to meet you.  You have a UTI. We will treat this with Keflex. You likely have allergies or the starting portion of a viral illness. You can use over-the-counter Claritin or Flonase to help with your allergy symptoms. If you develop abdominal pain, fevers, cooperative of blood, shortness of breath, or any new or changing symptoms please seek medical attention.

## 2015-06-15 NOTE — Assessment & Plan Note (Signed)
Symptoms and UA concerning for UTI. Benign abdominal exam. Vital signs stable. We'll treat with Keflex. Send urine for culture. Not enough to send for microscopy. She'll continue to monitor. She is given return precautions.

## 2015-06-15 NOTE — Assessment & Plan Note (Addendum)
Suspect this is related to allergies versus viral illness given postnasal drip and rhinorrhea. No focal findings on exam to indicate a bacterial illness. Benign lung exam. Vital signs are stable. She will trial over-the-counter Claritin or Flonase for her symptoms. She'll continue to monitor. She is given return precautions.

## 2015-06-17 LAB — URINE CULTURE: Colony Count: 80000

## 2015-06-21 ENCOUNTER — Other Ambulatory Visit: Payer: Self-pay | Admitting: Family Medicine

## 2015-06-21 MED ORDER — BENZONATATE 200 MG PO CAPS: 200.0000 mg | ORAL_CAPSULE | Freq: Two times a day (BID) | ORAL | Status: DC | PRN

## 2015-06-22 ENCOUNTER — Ambulatory Visit: Payer: Medicare Other | Admitting: Primary Care

## 2015-06-23 ENCOUNTER — Other Ambulatory Visit: Payer: Self-pay | Admitting: Family Medicine

## 2015-06-24 ENCOUNTER — Telehealth: Payer: Self-pay | Admitting: Primary Care

## 2015-06-24 NOTE — Telephone Encounter (Signed)
Pt called to stated that she is needing a refill on the Keflex that she was given for her UTI. Pt stated that her pharmacy has sent the request to Dr. Christin [email or web address]

## 2015-06-24 NOTE — Telephone Encounter (Signed)
Patient needs re-evaluation if she feels she needs another round of antibiotics. Please inform her of this. Thanks.

## 2015-06-24 NOTE — Telephone Encounter (Signed)
Please advise refill on antibiotic?

## 2015-06-24 NOTE — Telephone Encounter (Signed)
Spoke with the patient, She was not happy that she couldn't get a refill.  She said that she has had to get two rounds of antibiotics in the past.  I advised her to go to [facility] urgent care or another walk in to be seen.  She verbalized understanding.

## 2015-06-25 DIAGNOSIS — R300 Dysuria: Secondary | ICD-10-CM | POA: Diagnosis not present

## 2015-06-25 DIAGNOSIS — N390 Urinary tract infection, site not specified: Secondary | ICD-10-CM | POA: Diagnosis not present

## 2015-06-25 DIAGNOSIS — J069 Acute upper respiratory infection, unspecified: Secondary | ICD-10-CM | POA: Diagnosis not present

## 2015-06-30 ENCOUNTER — Encounter: Payer: Self-pay | Admitting: Primary Care

## 2015-06-30 ENCOUNTER — Ambulatory Visit (INDEPENDENT_AMBULATORY_CARE_PROVIDER_SITE_OTHER): Payer: Medicare Other | Admitting: Primary Care

## 2015-06-30 VITALS — BP 168/78 | HR 93 | Temp 98.2°F | Wt 157.0 lb

## 2015-06-30 DIAGNOSIS — B379 Candidiasis, unspecified: Secondary | ICD-10-CM | POA: Diagnosis not present

## 2015-06-30 DIAGNOSIS — T3695XA Adverse effect of unspecified systemic antibiotic, initial encounter: Principal | ICD-10-CM

## 2015-06-30 DIAGNOSIS — F411 Generalized anxiety disorder: Secondary | ICD-10-CM | POA: Diagnosis not present

## 2015-06-30 MED ORDER — FLUCONAZOLE 150 MG PO TABS: 150.0000 mg | ORAL_TABLET | Freq: Once | ORAL | Status: DC

## 2015-06-30 NOTE — Progress Notes (Signed)
Pre visit review using our clinic review tool, if applicable. No additional management support is needed unless otherwise documented below in the visit note. 

## 2015-06-30 NOTE — Progress Notes (Signed)
Subjective:    Patient ID: Ashlee Lopez, female    DOB: [birth year], 82 y.o.   MRN: [id number]  HPI  Ashlee Lopez is a 82 year old female who presents today with a chief complaint of rib pain. Her pain has been present since falling forward yesterday after missing 1 small step downward while exiting a restaurant.   She hit her chin, anterior lower extremities and anterior chest wall. She denies hitting her head. She's been active today around her house doing her typical chores without much difficulty. Denies headaches, visual changes, dizziness.   2) Vaginal Itching: Present since early April. She has been on Keflex and Levaquin in the past several weeks for a urinary tract infection. She's not noticed any vaginal discharge, dysuria, urinary frequency. Her itching is bothersome throughout the day. She is not prone to vaginal yeast infections.  Review of Systems  Eyes: Negative for visual disturbance.  Respiratory: Negative for shortness of breath.   Cardiovascular: Negative for chest pain.  Genitourinary: Negative for dysuria, frequency and vaginal discharge.       Vaginal itching.  Musculoskeletal:       Lower, anterior, chest wall soreness.   Skin:       Bruising to chin and right patella. Abrasion to left patella.  Neurological: Negative for dizziness and headaches.  Psychiatric/Behavioral: Negative for confusion.       Past Medical History  Diagnosis Date  . ITP (idiopathic thrombocytopenic purpura) 03/21/2011  . S/P splenectomy 03/21/2011  . Benign essential HTN 03/21/2011  . Hypothyroid 03/21/2011  . Graves' disease with exophthalmos 03/23/2011  . Diverticulosis of colon 03/23/2011  . IBS (irritable bowel syndrome) 03/23/2011  . Fibrocystic disease of breast 03/23/2011  . Varicose vein of leg 03/23/2011  . Hypertension   . Cancer ([place])   . Breast cancer, stage 1 ([place]) 02/10/2003    Right tubular breast cancer  . Uterus cancer ([place]) 03/23/1999    Well differentiated AdenoCA of  endometrium-superficially confined     Social History   Social History  . Marital Status: Widowed    Spouse Name: N/A  . Number of Children: N/A  . Years of Education: N/A   Occupational History  . Not on file.   Social History Main Topics  . Smoking status: Never Smoker   . Smokeless tobacco: Not on file  . Alcohol Use: No  . Drug Use: No  . Sexual Activity: No   Other Topics Concern  . Not on file   Social History Narrative   Widow. Lives alone.    3 children, 5 grandchildren.   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   Enjoys reading, watching TV.    Past Surgical History  Procedure Laterality Date  . Appendectomy  1941  . Splenectomy  1986  . Abdominal hysterectomy  2001  . Mastectomy  2004    Dr Margot Chimes  . Oophorectomy      BSO  . Breast surgery  02/17/2003    Mastectomy-Right    Family History  Problem Relation Age of Onset  . Breast cancer Mother     Age 8  . Hypertension Father   . Heart disease Father   . Lung cancer Father   . Breast cancer Sister     Age 53  . Dementia Sister     Allergies  Allergen Reactions  . Sulfamethoxazole-Trimethoprim Hives  . Septra [Bactrim]     Current Outpatient Prescriptions on File Prior to Visit  Medication Sig Dispense  Refill  . ALPRAZolam (XANAX) 0.5 MG tablet Take 0.5 mg by mouth 2 (two) times daily.     Marland Kitchen amLODipine (NORVASC) 5 MG tablet daily.     Marland Kitchen aspirin 81 MG tablet Take 81 mg by mouth daily.      . benzonatate (TESSALON) 200 MG capsule Take 1 capsule (200 mg total) by mouth 2 (two) times daily as needed for cough. 20 capsule 0  . CALCIUM-VITAMIN D PO Take by mouth.      . cephALEXin (KEFLEX) 500 MG capsule Take 1 capsule (500 mg total) by mouth 2 (two) times daily. 14 capsule 0  . Diphenhydramine-APAP, sleep, (TYLENOL PM EXTRA STRENGTH PO) Take by mouth.      . hydrochlorothiazide 25 MG tablet Take 25 mg by mouth daily.      Marland Kitchen levothyroxine (SYNTHROID, LEVOTHROID) 100 MCG tablet Take 100 mcg by  mouth daily.      Marland Kitchen METOPROLOL TARTRATE PO Take 50 mg by mouth 2 (two) times daily.     . Multiple Vitamin (MULTI-VITAMIN PO) Take by mouth.      . ranitidine (ZANTAC) 150 MG tablet     . rosuvastatin (CRESTOR) 5 MG tablet Take 5 mg by mouth every other day.      No current facility-administered medications on file prior to visit.    BP 168/78 mmHg  Pulse 93  Temp(Src) 98.2 F (36.8 C) (Oral)  Wt 157 lb (71.215 kg)  SpO2 99%    Objective:   Physical Exam  Constitutional: She is oriented to person, place, and time.  Eyes: EOM are normal. Pupils are equal, round, and reactive to light.  Cardiovascular: Normal rate and regular rhythm.   Pulmonary/Chest: Effort normal and breath sounds normal.  Equal chest rise and fall.   Musculoskeletal:  Minor tenderness to lower ribs of anterior chest wall. No step off or obvious deformity to ribs bilaterally. No bruising to skin.  Neurological: She is alert and oriented to person, place, and time. No cranial nerve deficit.  Skin: Skin is warm and dry.  Mild abrasion to left patella, mild bruising to right patella. Mild-mod bruising to chin, intact.          Assessment & Plan:  Fall:  Occurred Wednesday afternoon while accidentally missing a small step when leaving a restaurant. No dizziness, headaches, confusion, visual disturbances. Neuro exam unremarkable. MSK exam without evidence of any deformity. Stable exam and okay to send home. Mild bruising to extremities and chin as mentioned above. She does not need imaging today. Steady gait in office today. Return precautions provided.   Vaginal Itching:  Treated for UTI with 2 different rounds of antibiotics within the last 2 weeks. Suspect she has an antibiotic induced yeast infection. Will treat with single dose of Fluconazole.  Follow up PRN.

## 2015-06-30 NOTE — Patient Instructions (Signed)
Take the Fluconazole tablet once for antibiotic induced yeast infection.  Your exam was normal. No evidence of any breaks in your bones or other abnormalities. Please notify me if you develop headaches, dizziness, shortness of breath.  Please schedule a physical with me within the next 3 months. You may also schedule a lab only appointment 3-4 days prior. We will discuss your lab results in detail during your physical.  It was a pleasure to see you today!

## 2015-07-01 NOTE — Assessment & Plan Note (Signed)
Stopped taking Zoloft after several days as she felt "awful". Continues to take alprazolam as prescribed by prior PCP.

## 2015-07-05 ENCOUNTER — Other Ambulatory Visit: Payer: Self-pay

## 2015-07-05 MED ORDER — HYDROCHLOROTHIAZIDE 25 MG PO TABS: 25.0000 mg | ORAL_TABLET | Freq: Every day | ORAL | Status: DC

## 2015-07-05 MED ORDER — LEVOTHYROXINE SODIUM 100 MCG PO TABS: 100.0000 ug | ORAL_TABLET | Freq: Every day | ORAL | Status: DC

## 2015-07-05 MED ORDER — AMLODIPINE BESYLATE 5 MG PO TABS: 5.0000 mg | ORAL_TABLET | Freq: Every day | ORAL | Status: DC

## 2015-07-05 MED ORDER — METOPROLOL TARTRATE 50 MG PO TABS: 50.0000 mg | ORAL_TABLET | Freq: Two times a day (BID) | ORAL | Status: DC

## 2015-07-05 NOTE — Telephone Encounter (Signed)
Pt established care 05/11/15 and pt request 3 month refill on amlodipine,HCTZ.levothyroxine and metoprolol to [facility]; pt has CPX scheduled 10/04/15. Advised pt done.

## 2015-07-12 ENCOUNTER — Telehealth: Payer: Self-pay | Admitting: Primary Care

## 2015-07-12 DIAGNOSIS — F411 Generalized anxiety disorder: Secondary | ICD-10-CM

## 2015-07-12 MED ORDER — ALPRAZOLAM 0.5 MG PO TABS: 0.5000 mg | ORAL_TABLET | Freq: Two times a day (BID) | ORAL | Status: DC | PRN

## 2015-07-12 NOTE — Telephone Encounter (Signed)
Received faxed refill request for alprazolam (XANAX) 0.5 MG tablet .  Rx has not been filled by [facility]. Last seen on 06/30/2015. CPE on 10/04/2015.

## 2015-07-12 NOTE — Telephone Encounter (Signed)
Pt left v/m requesting status of alprazolam refill. 

## 2015-07-13 MED ORDER — RANITIDINE HCL 150 MG PO TABS: 150.0000 mg | ORAL_TABLET | Freq: Every day | ORAL | Status: DC

## 2015-07-13 NOTE — Telephone Encounter (Signed)
Message left for patient to return my call.  

## 2015-07-13 NOTE — Telephone Encounter (Signed)
Ok per [place] to sent in the Zantac. Notified patient as well.

## 2015-07-13 NOTE — Addendum Note (Signed)
Addended by: Jacqualin Combes on: 07/13/2015 02:23 PM   Modules accepted: Orders

## 2015-07-13 NOTE — Telephone Encounter (Signed)
[facility]ed in [facility].

## 2015-07-13 NOTE — Telephone Encounter (Signed)
Patient returned Chan's call.  Patient can be reached at [phone].  If the home number is busy,call her back on her cell phone [phone].

## 2015-07-13 NOTE — Telephone Encounter (Signed)
Called patient and notified patient that prescription have been called in to [facility].

## 2015-08-01 ENCOUNTER — Telehealth: Payer: Self-pay | Admitting: Oncology

## 2015-08-01 NOTE — Telephone Encounter (Signed)
Printed records requested by pt and contact pt that record are ready for pickup/ gave to Ms. Wilma for pick up

## 2015-08-02 ENCOUNTER — Encounter: Payer: Self-pay | Admitting: Primary Care

## 2015-08-02 ENCOUNTER — Ambulatory Visit (INDEPENDENT_AMBULATORY_CARE_PROVIDER_SITE_OTHER): Payer: Medicare Other | Admitting: Primary Care

## 2015-08-02 VITALS — BP 140/80 | HR 74 | Temp 98.0°F | Ht 65.0 in | Wt 157.0 lb

## 2015-08-02 DIAGNOSIS — N390 Urinary tract infection, site not specified: Secondary | ICD-10-CM

## 2015-08-02 LAB — POC URINALSYSI DIPSTICK (AUTOMATED)
Bilirubin, UA: NEGATIVE
Blood, UA: NEGATIVE
Glucose, UA: NEGATIVE
KETONES UA: NEGATIVE
Nitrite, UA: POSITIVE
PROTEIN UA: NEGATIVE
SPEC GRAV UA: 1.015
Urobilinogen, UA: NEGATIVE
pH, UA: 8

## 2015-08-02 MED ORDER — CEPHALEXIN 500 MG PO CAPS
500.0000 mg | ORAL_CAPSULE | Freq: Two times a day (BID) | ORAL | Status: DC
Start: 2015-08-02 — End: 2015-09-08

## 2015-08-02 NOTE — Addendum Note (Signed)
Addended by: Jacqualin Combes on: 08/02/2015 11:43 AM   Modules accepted: Orders, SmartSet

## 2015-08-02 NOTE — Patient Instructions (Signed)
Start Cephalexin antibiotics. Take 1 capsule by mouth twice daily for 7 days.  Ensure you are staying hydrated with water.  You may take AZO for any burning or pelvic discomfort. This may be purchased over the counter.  Please notify me if no improvement in 3-4 days.  It was a pleasure to see you today!  Urinary Tract Infection Urinary tract infections (UTIs) can develop anywhere along your urinary tract. Your urinary tract is your body's drainage system for removing wastes and extra water. Your urinary tract includes two kidneys, two ureters, a bladder, and a urethra. Your kidneys are a pair of bean-shaped organs. Each kidney is about the size of your fist. They are located below your ribs, one on each side of your spine. CAUSES Infections are caused by microbes, which are microscopic organisms, including fungi, viruses, and bacteria. These organisms are so small that they can only be seen through a microscope. Bacteria are the microbes that most commonly cause UTIs. SYMPTOMS  Symptoms of UTIs may vary by age and gender of the patient and by the location of the infection. Symptoms in young women typically include a frequent and intense urge to urinate and a painful, burning feeling in the bladder or urethra during urination. Older women and men are more likely to be tired, shaky, and weak and have muscle aches and abdominal pain. A fever may mean the infection is in your kidneys. Other symptoms of a kidney infection include pain in your back or sides below the ribs, nausea, and vomiting. DIAGNOSIS To diagnose a UTI, your caregiver will ask you about your symptoms. Your caregiver will also ask you to provide a urine sample. The urine sample will be tested for bacteria and white blood cells. White blood cells are made by your body to help fight infection. TREATMENT  Typically, UTIs can be treated with medication. Because most UTIs are caused by a bacterial infection, they usually can be treated  with the use of antibiotics. The choice of antibiotic and length of treatment depend on your symptoms and the type of bacteria causing your infection. HOME CARE INSTRUCTIONS  If you were prescribed antibiotics, take them exactly as your caregiver instructs you. Finish the medication even if you feel better after you have only taken some of the medication.  Drink enough water and fluids to keep your urine clear or pale yellow.  Avoid caffeine, tea, and carbonated beverages. They tend to irritate your bladder.  Empty your bladder often. Avoid holding urine for long periods of time.  Empty your bladder before and after sexual intercourse.  After a bowel movement, women should cleanse from front to back. Use each tissue only once. SEEK MEDICAL CARE IF:   You have back pain.  You develop a fever.  Your symptoms do not begin to resolve within 3 days. SEEK IMMEDIATE MEDICAL CARE IF:   You have severe back pain or lower abdominal pain.  You develop chills.  You have nausea or vomiting.  You have continued burning or discomfort with urination. MAKE SURE YOU:   Understand these instructions.  Will watch your condition.  Will get help right away if you are not doing well or get worse.   This information is not intended to replace advice given to you by your health care provider. Make sure you discuss any questions you have with your health care provider.   Document Released: 12/13/2004 Document Revised: 11/24/2014 Document Reviewed: 04/13/2011 Elsevier Interactive Patient Education [employer].

## 2015-08-02 NOTE — Progress Notes (Signed)
Subjective:    Patient ID: Ashlee Lopez, female    DOB: [birth year], 82 y.o.   MRN: [id number]  HPI  [name] is a 82 year old female who presents today with a chief complaint of urinary frequency. She also reports pelvic pressure, mild dysuria. Her symptoms have been present since Sunday evening. Overall she's feeling about the same. Denies vaginal discharge, fevers, flank pain, hematuria. She's not taken anything OTC for her symptoms.   Review of Systems  Constitutional: Negative for fever.  Respiratory: Negative for shortness of breath.   Cardiovascular: Negative for chest pain.  Genitourinary: Positive for dysuria and frequency. Negative for hematuria, flank pain, vaginal discharge and difficulty urinating.       Past Medical History  Diagnosis Date  . ITP (idiopathic thrombocytopenic purpura) 03/21/2011  . S/P splenectomy 03/21/2011  . Benign essential HTN 03/21/2011  . Hypothyroid 03/21/2011  . Graves' disease with exophthalmos 03/23/2011  . Diverticulosis of colon 03/23/2011  . IBS (irritable bowel syndrome) 03/23/2011  . Fibrocystic disease of breast 03/23/2011  . Varicose vein of leg 03/23/2011  . Hypertension   . Cancer ([place])   . Breast cancer, stage 1 ([place]) 02/10/2003    Right tubular breast cancer  . Uterus cancer ([place]) 03/23/1999    Well differentiated AdenoCA of endometrium-superficially confined     Social History   Social History  . Marital Status: Widowed    Spouse Name: N/A  . Number of Children: N/A  . Years of Education: N/A   Occupational History  . Not on file.   Social History Main Topics  . Smoking status: Never Smoker   . Smokeless tobacco: Not on file  . Alcohol Use: No  . Drug Use: No  . Sexual Activity: No   Other Topics Concern  . Not on file   Social History Narrative   Widow. Lives alone.    3 children, 5 grandchildren.   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   Enjoys reading, watching TV.    Past Surgical History  Procedure Laterality Date    . Appendectomy  1941  . Splenectomy  1986  . Abdominal hysterectomy  2001  . Mastectomy  2004    Dr Margot Chimes  . Oophorectomy      BSO  . Breast surgery  02/17/2003    Mastectomy-Right    Family History  Problem Relation Age of Onset  . Breast cancer Mother     Age 85  . Hypertension Father   . Heart disease Father   . Lung cancer Father   . Breast cancer Sister     Age 69  . Dementia Sister     Allergies  Allergen Reactions  . Sulfamethoxazole-Trimethoprim Hives  . Septra [Bactrim]     Current Outpatient Prescriptions on File Prior to Visit  Medication Sig Dispense Refill  . ALPRAZolam (XANAX) 0.5 MG tablet Take 1 tablet (0.5 mg total) by mouth 2 (two) times daily as needed for anxiety. 60 tablet 0  . amLODipine (NORVASC) 5 MG tablet Take 1 tablet (5 mg total) by mouth daily. 90 tablet 2  . aspirin 81 MG tablet Take 81 mg by mouth daily.      Marland Kitchen CALCIUM-VITAMIN D PO Take by mouth.      . Diphenhydramine-APAP, sleep, (TYLENOL PM EXTRA STRENGTH PO) Take by mouth.      . hydrochlorothiazide (HYDRODIURIL) 25 MG tablet Take 1 tablet (25 mg total) by mouth daily. 90 tablet 2  . levothyroxine (SYNTHROID,  LEVOTHROID) 100 MCG tablet Take 1 tablet (100 mcg total) by mouth daily. 90 tablet 2  . metoprolol (LOPRESSOR) 50 MG tablet Take 1 tablet (50 mg total) by mouth 2 (two) times daily. 180 tablet 2  . Multiple Vitamin (MULTI-VITAMIN PO) Take by mouth.      . ranitidine (ZANTAC) 150 MG tablet Take 1 tablet (150 mg total) by mouth daily. 90 tablet 2  . rosuvastatin (CRESTOR) 5 MG tablet Take 5 mg by mouth every other day.      No current facility-administered medications on file prior to visit.    BP 140/80 mmHg  Pulse 74  Temp(Src) 98 F (36.7 C) (Oral)  Ht 5\' 5"  (1.651 m)  Wt 157 lb (71.215 kg)  BMI 26.13 kg/m2  SpO2 98%    Objective:   Physical Exam  Constitutional: She appears well-nourished.  Cardiovascular: Normal rate and regular rhythm.   Pulmonary/Chest:  Effort normal and breath sounds normal.  Abdominal: There is no tenderness. There is no CVA tenderness.  Skin: Skin is warm and dry.          Assessment & Plan:  Urinary Frequency:  Present since Sunday with mild dysuria and pelvic pressure. No fevers, flank pain, hematuria. UA: 3+ leuks, positive nitrites. No blood or glucose. Culture pending. Rx for Cephalexin BID 7 day course sent to pharmacy. Push fluids, follow up PRN.

## 2015-08-02 NOTE — Progress Notes (Signed)
Pre visit review using our clinic review tool, if applicable. No additional management support is needed unless otherwise documented below in the visit note. 

## 2015-08-05 LAB — URINE CULTURE: Colony Count: >=100000

## 2015-08-10 ENCOUNTER — Other Ambulatory Visit: Payer: Self-pay | Admitting: Primary Care

## 2015-08-10 NOTE — Telephone Encounter (Signed)
Called in Xanax to [facility].

## 2015-08-10 NOTE — Telephone Encounter (Signed)
Spoken and notified patient of Kate's comments. Patient verbalized understanding. 

## 2015-08-10 NOTE — Telephone Encounter (Signed)
Electronically refill request for ALPRAZolam Duanne Moron) 0.5 MG tablet    Last prescribed on 07/12/2015. Last seen on 08/02/2015. CPE on 10/04/2015.

## 2015-09-08 ENCOUNTER — Ambulatory Visit (INDEPENDENT_AMBULATORY_CARE_PROVIDER_SITE_OTHER): Payer: Medicare Other | Admitting: Internal Medicine

## 2015-09-08 ENCOUNTER — Encounter: Payer: Self-pay | Admitting: Internal Medicine

## 2015-09-08 VITALS — BP 148/90 | HR 78 | Temp 97.5°F | Wt 159.0 lb

## 2015-09-08 DIAGNOSIS — N3000 Acute cystitis without hematuria: Secondary | ICD-10-CM | POA: Diagnosis not present

## 2015-09-08 LAB — POC URINALSYSI DIPSTICK (AUTOMATED)
Bilirubin, UA: NEGATIVE
Glucose, UA: NEGATIVE
Ketones, UA: NEGATIVE
NITRITE UA: POSITIVE
PH UA: 7
Protein, UA: NEGATIVE
Spec Grav, UA: 1.015
UROBILINOGEN UA: 4

## 2015-09-08 MED ORDER — AMOXICILLIN-POT CLAVULANATE 875-125 MG PO TABS: 1.0000 | ORAL_TABLET | Freq: Two times a day (BID) | ORAL | Status: DC

## 2015-09-08 NOTE — Patient Instructions (Signed)
Please start the antibiotic tonight--after eating. If your symptoms are better after the first or second dose, you can stop it after 3 days (and save it in case symptoms return). You may want to try a daily cranberry extract pill to help prevent urine infections.

## 2015-09-08 NOTE — Addendum Note (Signed)
Addended by: Pilar Grammes on: 09/08/2015 03:23 PM   Modules accepted: Orders, SmartSet

## 2015-09-08 NOTE — Progress Notes (Signed)
Pre visit review using our clinic review tool, if applicable. No additional management support is needed unless otherwise documented below in the visit note. 

## 2015-09-08 NOTE — Progress Notes (Signed)
Subjective:    Patient ID: Ashlee Lopez, female    DOB: 07-Dec-1933, 82 y.o.   MRN: [id number]  HPI Here due to urinary   Having trouble with urine again Has generally taken cipro when she has a problem Now with 5 visits since January-- first [facility] walkin and got macrobid Then visits in March and last month with Velora Heckler Did seem better after last antibiotic course  Has had symptoms back for the past 2 days Tried azo for 2 days but things persists Has suprapubic pressure and some mild burning dysuria Some urgency  Some incontinence at times  Current Outpatient Prescriptions on File Prior to Visit  Medication Sig Dispense Refill  . ALPRAZolam (XANAX) 0.5 MG tablet Take 1 tablet (0.5 mg total) by mouth daily as needed for anxiety. 30 tablet 0  . amLODipine (NORVASC) 5 MG tablet Take 1 tablet (5 mg total) by mouth daily. 90 tablet 2  . aspirin 81 MG tablet Take 81 mg by mouth daily.      Marland Kitchen CALCIUM-VITAMIN D PO Take by mouth.      . Diphenhydramine-APAP, sleep, (TYLENOL PM EXTRA STRENGTH PO) Take by mouth.      . hydrochlorothiazide (HYDRODIURIL) 25 MG tablet Take 1 tablet (25 mg total) by mouth daily. 90 tablet 2  . levothyroxine (SYNTHROID, LEVOTHROID) 100 MCG tablet Take 1 tablet (100 mcg total) by mouth daily. 90 tablet 2  . metoprolol (LOPRESSOR) 50 MG tablet Take 1 tablet (50 mg total) by mouth 2 (two) times daily. 180 tablet 2  . Multiple Vitamin (MULTI-VITAMIN PO) Take by mouth.      . ranitidine (ZANTAC) 150 MG tablet Take 1 tablet (150 mg total) by mouth daily. 90 tablet 2  . rosuvastatin (CRESTOR) 5 MG tablet Take 5 mg by mouth every other day.      No current facility-administered medications on file prior to visit.    Allergies  Allergen Reactions  . Sulfamethoxazole-Trimethoprim Hives  . Septra [Bactrim]     Past Medical History  Diagnosis Date  . ITP (idiopathic thrombocytopenic purpura) 03/21/2011  . S/P splenectomy 03/21/2011  . Benign essential HTN 03/21/2011  .  Hypothyroid 03/21/2011  . Graves' disease with exophthalmos 03/23/2011  . Diverticulosis of colon 03/23/2011  . IBS (irritable bowel syndrome) 03/23/2011  . Fibrocystic disease of breast 03/23/2011  . Varicose vein of leg 03/23/2011  . Hypertension   . Cancer ([place])   . Breast cancer, stage 1 ([place]) 02/10/2003    Right tubular breast cancer  . Uterus cancer ([place]) 03/23/1999    Well differentiated AdenoCA of endometrium-superficially confined    Past Surgical History  Procedure Laterality Date  . Appendectomy  1941  . Splenectomy  1986  . Abdominal hysterectomy  2001  . Mastectomy  2004    Dr Margot Chimes  . Oophorectomy      BSO  . Breast surgery  02/17/2003    Mastectomy-Right    Family History  Problem Relation Age of Onset  . Breast cancer Mother     Age 96  . Hypertension Father   . Heart disease Father   . Lung cancer Father   . Breast cancer Sister     Age 30  . Dementia Sister     Social History   Social History  . Marital Status: Widowed    Spouse Name: N/A  . Number of Children: N/A  . Years of Education: N/A   Occupational History  . Not on file.  Social History Main Topics  . Smoking status: Never Smoker   . Smokeless tobacco: Not on file  . Alcohol Use: No  . Drug Use: No  . Sexual Activity: No   Other Topics Concern  . Not on file   Social History Narrative   Widow. Lives alone.    3 children, 5 grandchildren.   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   Enjoys reading, watching TV.   Review of Systems No fever No N/V Eating fine    Objective:   Physical Exam  Abdominal: Soft. She exhibits no distension. There is no tenderness. There is no rebound and no guarding.  Musculoskeletal:  No CVA tenderness          Assessment & Plan:

## 2015-09-08 NOTE — Assessment & Plan Note (Signed)
No systemic symptoms Some resistance with her last testing Will try augmentin instead---may only need for 3 days ?try cranberry?

## 2015-09-09 ENCOUNTER — Encounter: Payer: Self-pay | Admitting: Genetic Counselor

## 2015-09-09 ENCOUNTER — Ambulatory Visit: Payer: Medicare Other | Admitting: Primary Care

## 2015-09-12 ENCOUNTER — Other Ambulatory Visit: Payer: Self-pay | Admitting: Primary Care

## 2015-09-12 NOTE — Telephone Encounter (Signed)
Electronically refill request for   ALPRAZolam (XANAX) 0.5 MG tablet   Take 1 tablet (0.5 mg total) by mouth daily as needed for anxiety.  Dispense: 30 tablet   Refills: 0     Last prescribed on 08/10/2015. Last seen on 09/08/2015. CPE on 10/04/2015.

## 2015-09-12 NOTE — Telephone Encounter (Signed)
Will discuss alprazolam use at upcoming physical.

## 2015-09-13 NOTE — Telephone Encounter (Signed)
Called in alprazolam to [facility].

## 2015-09-23 ENCOUNTER — Other Ambulatory Visit: Payer: Self-pay | Admitting: Primary Care

## 2015-09-23 DIAGNOSIS — E559 Vitamin D deficiency, unspecified: Secondary | ICD-10-CM

## 2015-09-23 DIAGNOSIS — E785 Hyperlipidemia, unspecified: Secondary | ICD-10-CM

## 2015-09-23 DIAGNOSIS — I10 Essential (primary) hypertension: Secondary | ICD-10-CM

## 2015-09-28 ENCOUNTER — Other Ambulatory Visit (INDEPENDENT_AMBULATORY_CARE_PROVIDER_SITE_OTHER): Payer: Medicare Other

## 2015-09-28 ENCOUNTER — Ambulatory Visit (INDEPENDENT_AMBULATORY_CARE_PROVIDER_SITE_OTHER): Payer: Medicare Other

## 2015-09-28 VITALS — BP 138/88 | HR 76 | Temp 98.4°F | Ht 64.0 in | Wt 156.0 lb

## 2015-09-28 DIAGNOSIS — E559 Vitamin D deficiency, unspecified: Secondary | ICD-10-CM

## 2015-09-28 DIAGNOSIS — E785 Hyperlipidemia, unspecified: Secondary | ICD-10-CM

## 2015-09-28 DIAGNOSIS — Z0000 Encounter for general adult medical examination without abnormal findings: Secondary | ICD-10-CM | POA: Diagnosis not present

## 2015-09-28 DIAGNOSIS — Z23 Encounter for immunization: Secondary | ICD-10-CM | POA: Diagnosis not present

## 2015-09-28 DIAGNOSIS — D693 Immune thrombocytopenic purpura: Secondary | ICD-10-CM

## 2015-09-28 DIAGNOSIS — I10 Essential (primary) hypertension: Secondary | ICD-10-CM | POA: Diagnosis not present

## 2015-09-28 NOTE — Progress Notes (Signed)
Pre visit review using our clinic review tool, if applicable. No additional management support is needed unless otherwise documented below in the visit note. 

## 2015-09-28 NOTE — Progress Notes (Signed)
I reviewed health advisor's note, was available for consultation, and agree with documentation and plan.  

## 2015-09-28 NOTE — Progress Notes (Signed)
PCP notes:  Health maintenance:  PCV13 - administered PAP smear - no longer a candidate per pt Shingles - no longer a candidate per pt  Abnormal screenings:  Hearing - failed Fall risk - hx of fall with injury  Patient concerns: None  Nurse concerns: None  Next PCP appt: 10/04/15 @ 11AM

## 2015-09-28 NOTE — Progress Notes (Signed)
Subjective:   Ashlee Lopez is a 82 y.o. female who presents for an Initial Medicare Annual Wellness Visit.  Review of Systems    N/A  Cardiac Risk Factors include: advanced age (>17men, >18 women);dyslipidemia;hypertension;sedentary lifestyle     Objective:    Today's Vitals   09/28/15 1020  BP: 138/88  Pulse: 76  Temp: 98.4 F (36.9 C)  TempSrc: Oral  Height: 5\' 4"  (1.626 m)  Weight: 156 lb (70.761 kg)  SpO2: 96%  PainSc: 0-No pain   Body mass index is 26.76 kg/(m^2).   Current Medications (verified) Outpatient Encounter Prescriptions as of 09/28/2015  Medication Sig  . ALPRAZolam (XANAX) 0.5 MG tablet TAKE 1 TABLET BY MOUTH DAILY AS NEEDED FOR ANXIETY  . amLODipine (NORVASC) 5 MG tablet Take 1 tablet (5 mg total) by mouth daily.  Marland Kitchen aspirin 81 MG tablet Take 81 mg by mouth daily.    Marland Kitchen CALCIUM-VITAMIN D PO Take by mouth.    . Diphenhydramine-APAP, sleep, (TYLENOL PM EXTRA STRENGTH PO) Take by mouth.    . hydrochlorothiazide (HYDRODIURIL) 25 MG tablet Take 1 tablet (25 mg total) by mouth daily.  Marland Kitchen levothyroxine (SYNTHROID, LEVOTHROID) 100 MCG tablet Take 1 tablet (100 mcg total) by mouth daily.  . metoprolol (LOPRESSOR) 50 MG tablet Take 1 tablet (50 mg total) by mouth 2 (two) times daily.  . Multiple Vitamin (MULTI-VITAMIN PO) Take by mouth.    . ranitidine (ZANTAC) 150 MG tablet Take 1 tablet (150 mg total) by mouth daily.  . rosuvastatin (CRESTOR) 5 MG tablet Take 5 mg by mouth every other day.   . [DISCONTINUED] amoxicillin-clavulanate (AUGMENTIN) 875-125 MG tablet Take 1 tablet by mouth 2 (two) times daily.   No facility-administered encounter medications on file as of 09/28/2015.    Allergies (verified) Sulfamethoxazole-trimethoprim and Septra   History: Past Medical History  Diagnosis Date  . ITP (idiopathic thrombocytopenic purpura) 03/21/2011  . S/P splenectomy 03/21/2011  . Benign essential HTN 03/21/2011  . Hypothyroid 03/21/2011  . Graves' disease with  exophthalmos 03/23/2011  . Diverticulosis of colon 03/23/2011  . IBS (irritable bowel syndrome) 03/23/2011  . Fibrocystic disease of breast 03/23/2011  . Varicose vein of leg 03/23/2011  . Hypertension   . Cancer ([place])   . Breast cancer, stage 1 ([place]) 02/10/2003    Right tubular breast cancer  . Uterus cancer ([place]) 03/23/1999    Well differentiated AdenoCA of endometrium-superficially confined   Past Surgical History  Procedure Laterality Date  . Appendectomy  1941  . Splenectomy  1986  . Abdominal hysterectomy  2001  . Mastectomy  2004    Dr Margot Chimes  . Oophorectomy      BSO  . Breast surgery  02/17/2003    Mastectomy-Right   Family History  Problem Relation Age of Onset  . Breast cancer Mother     Age 16  . Hypertension Father   . Heart disease Father   . Lung cancer Father   . Breast cancer Sister     Age 25  . Dementia Sister    Social History   Occupational History  . Not on file.   Social History Main Topics  . Smoking status: Never Smoker   . Smokeless tobacco: Not on file  . Alcohol Use: No  . Drug Use: No  . Sexual Activity: No    Tobacco Counseling Counseling given: No   Activities of Daily Living In your present state of health, do you have any difficulty performing the  following activities: 09/28/2015  Hearing? Y  Vision? N  Difficulty concentrating or making decisions? N  Walking or climbing stairs? N  Dressing or bathing? N  Doing errands, shopping? N  Preparing Food and eating ? N  Using the Toilet? N  In the past six months, have you accidently leaked urine? Y  Do you have problems with loss of bowel control? Y  Managing your Medications? N  Managing your Finances? N  Housekeeping or managing your Housekeeping? N    Immunizations and Health Maintenance Immunization History  Administered Date(s) Administered  . Pneumococcal Conjugate-13 09/28/2015  . Pneumococcal Polysaccharide-23 03/23/2011   There are no preventive care reminders to display  for this patient.  Patient Care Team: Pleas Koch, NP as PCP - General (Internal Medicine) Annia Belt, MD (Hematology and Oncology) Eula Flax, OD as Referring Physician (Optometry) Christene Slates, MD as Consulting Physician (Dermatology)    Assessment:   This is a routine wellness examination for Ashlee Lopez.   Hearing/Vision screen  Hearing Screening   125Hz  250Hz  500Hz  1000Hz  2000Hz  4000Hz  8000Hz   Right ear:   40 40 0 0   Left ear:   0 0 0 0   Vision Screening Comments: Last vision exam on 12/23/2014 with Dr. Glennon Mac  Dietary issues and exercise activities discussed: Current Exercise Habits: The patient does not participate in regular exercise at present, Exercise limited by: None identified  Goals    . Increase water intake     Starting 09/28/15, I will continue to drink at least 6-8 glasses of water daily.       Depression Screen PHQ 2/9 Scores 09/28/2015  PHQ - 2 Score 0    Fall Risk Fall Risk  09/28/2015  Falls in the past year? Yes  Number falls in past yr: 1  Injury with Fall? Yes  Follow up Falls evaluation completed    Cognitive Function: MMSE - Mini Mental State Exam 09/28/2015  Orientation to time 5  Orientation to Place 5  Registration 3  Attention/ Calculation 0  Recall 3  Language- name 2 objects 0  Language- repeat 1  Language- follow 3 step command 3  Language- read & follow direction 0  Write a sentence 0  Copy design 0  Total score 20   PLEASE NOTE: A Mini-Cog screen was completed. Maximum score is 20. A value of 0 denotes this part of Folstein MMSE was not completed or the patient failed this part of the Mini-Cog screening.   Mini-Cog Screening Orientation to Time - Max 5 pts Orientation to Place - Max 5 pts Registration - Max 3 pts Recall - Max 3 pts Language Repeat - Max 1 pts Language Follow 3 Step Command - Max 3 pts  Screening Tests Health Maintenance  Topic Date Due  . PAP SMEAR  09/27/2048 (Originally 07/10/2012)  .  ZOSTAVAX  09/27/2048 (Originally 08/30/1995)  . INFLUENZA VACCINE  10/18/2015  . TETANUS/TDAP  07/02/2020  . DEXA SCAN  Completed  . PNA vac Low Risk Adult  Completed      Plan:     I have personally reviewed and addressed the Medicare Annual Wellness questionnaire and have noted the following in the patient's chart:  A. Medical and social history B. Use of alcohol, tobacco or illicit drugs  C. Current medications and supplements D. Functional ability and status E.  Nutritional status F.  Physical activity G. Advance directives H. List of other physicians I.  Hospitalizations, surgeries, and ER visits  in previous 12 months J.  [place] to include hearing, vision, cognitive, depression L. Referrals and appointments - none  In addition, I have reviewed and discussed with patient certain preventive protocols, quality metrics, and best practice recommendations. A written personalized care plan for preventive services as well as general preventive health recommendations were provided to patient.  See attached scanned questionnaire for additional information.   Signed,   Lindell Noe, MHA, BS, LPN Health Advisor

## 2015-09-28 NOTE — Patient Instructions (Signed)
[name] , Thank you for taking time to come for your Medicare Wellness Visit. I appreciate your ongoing commitment to your health goals. Please review the following plan we discussed and let me know if I can assist you in the future.   These are the goals we discussed: Goals    . Increase water intake     Starting 09/28/15, I will continue to drink at least 6-8 glasses of water daily.        This is a list of the screening recommended for you and due dates:  Health Maintenance  Topic Date Due  . Pap Smear  09/27/2048*  . Shingles Vaccine  09/27/2048*  . Flu Shot  10/18/2015  . Tetanus Vaccine  07/02/2020  . DEXA scan (bone density measurement)  Completed  . Pneumonia vaccines  Completed  *Topic was postponed. The date shown is not the original due date.    Preventive Care for Adults  A healthy lifestyle and preventive care can promote health and wellness. Preventive health guidelines for adults include the following key practices.  . A routine yearly physical is a good way to check with your health care provider about your health and preventive screening. It is a chance to share any concerns and updates on your health and to receive a thorough exam.  . Visit your dentist for a routine exam and preventive care every 6 months. Brush your teeth twice a day and floss once a day. Good oral hygiene prevents tooth decay and gum disease.  . The frequency of eye exams is based on your age, health, family medical history, use  of contact lenses, and other factors. Follow your health care provider's ecommendations for frequency of eye exams.  . Eat a healthy diet. Foods like vegetables, fruits, whole grains, low-fat dairy products, and lean protein foods contain the nutrients you need without too many calories. Decrease your intake of foods high in solid fats, added sugars, and salt. Eat the right amount of calories for you. Get information about a proper diet from your health care provider,  if necessary.  . Regular physical exercise is one of the most important things you can do for your health. Most adults should get at least 150 minutes of moderate-intensity exercise (any activity that increases your heart rate and causes you to sweat) each week. In addition, most adults need muscle-strengthening exercises on 2 or more days a week.  Silver Sneakers may be a benefit available to you. To determine eligibility, you may visit the website: www.silversneakers.com or contact program at [phone] Mon-Fri between 8AM-8PM.   . Maintain a healthy weight. The body mass index (BMI) is a screening tool to identify possible weight problems. It provides an estimate of body fat based on height and weight. Your health care provider can find your BMI and can help you achieve or maintain a healthy weight.   For adults 20 years and older: ? A BMI below 18.5 is considered underweight. ? A BMI of 18.5 to 24.9 is normal. ? A BMI of 25 to 29.9 is considered overweight. ? A BMI of 30 and above is considered obese.   . Maintain normal blood lipids and cholesterol levels by exercising and minimizing your intake of saturated fat. Eat a balanced diet with plenty of fruit and vegetables. Blood tests for lipids and cholesterol should begin at age 36 and be repeated every 5 years. If your lipid or cholesterol levels are high, you are over 50, or you  are at high risk for heart disease, you may need your cholesterol levels checked more frequently. Ongoing high lipid and cholesterol levels should be treated with medicines if diet and exercise are not working.  . If you smoke, find out from your health care provider how to quit. If you do not use tobacco, please do not start.  . If you choose to drink alcohol, please do not consume more than 2 drinks per day. One drink is considered to be 12 ounces (355 mL) of beer, 5 ounces (148 mL) of wine, or 1.5 ounces (44 mL) of liquor.  . If you are 38-23 years old, ask  your health care provider if you should take aspirin to prevent strokes.  . Use sunscreen. Apply sunscreen liberally and repeatedly throughout the day. You should seek shade when your shadow is shorter than you. Protect yourself by wearing long sleeves, pants, a wide-brimmed hat, and sunglasses year round, whenever you are outdoors.  . Once a month, do a whole body skin exam, using a mirror to look at the skin on your back. Tell your health care provider of new moles, moles that have irregular borders, moles that are larger than a pencil eraser, or moles that have changed in shape or color.

## 2015-09-29 LAB — CBC WITH DIFFERENTIAL/PLATELET
BASOS ABS: 0 10*3/uL (ref 0.0–0.2)
Basos: 1 %
EOS (ABSOLUTE): 0.1 10*3/uL (ref 0.0–0.4)
EOS: 2 %
HEMATOCRIT: 37.2 % (ref 34.0–46.6)
HEMOGLOBIN: 12.7 g/dL (ref 11.1–15.9)
Immature Grans (Abs): 0 10*3/uL (ref 0.0–0.1)
Immature Granulocytes: 0 %
LYMPHS ABS: 1.2 10*3/uL (ref 0.7–3.1)
Lymphs: 27 %
MCH: 31.4 pg (ref 26.6–33.0)
MCHC: 34.1 g/dL (ref 31.5–35.7)
MCV: 92 fL (ref 79–97)
MONOCYTES: 16 %
MONOS ABS: 0.7 10*3/uL (ref 0.1–0.9)
NEUTROS ABS: 2.4 10*3/uL (ref 1.4–7.0)
NEUTROS PCT: 54 %
Platelets: 325 10*3/uL (ref 150–379)
RBC: 4.04 x10E6/uL (ref 3.77–5.28)
RDW: 13.4 % (ref 12.3–15.4)
WBC: 4.4 10*3/uL (ref 3.4–10.8)

## 2015-09-29 LAB — COMPREHENSIVE METABOLIC PANEL
ALK PHOS: 66 IU/L (ref 39–117)
ALT: 18 IU/L (ref 0–32)
AST: 30 IU/L (ref 0–40)
Albumin/Globulin Ratio: 1.6 (ref 1.2–2.2)
Albumin: 4.4 g/dL (ref 3.5–4.7)
BILIRUBIN TOTAL: 0.7 mg/dL (ref 0.0–1.2)
BUN/Creatinine Ratio: 16 (ref 12–28)
BUN: 12 mg/dL (ref 8–27)
CHLORIDE: 95 mmol/L — AB (ref 96–106)
CO2: 26 mmol/L (ref 18–29)
CREATININE: 0.76 mg/dL (ref 0.57–1.00)
Calcium: 9.4 mg/dL (ref 8.7–10.3)
GFR calc Af Amer: 86 mL/min/{1.73_m2} (ref >59)
GFR calc non Af Amer: 74 mL/min/{1.73_m2} (ref >59)
GLUCOSE: 96 mg/dL (ref 65–99)
Globulin, Total: 2.8 g/dL (ref 1.5–4.5)
Potassium: 3.9 mmol/L (ref 3.5–5.2)
Sodium: 138 mmol/L (ref 134–144)
Total Protein: 7.2 g/dL (ref 6.0–8.5)

## 2015-09-29 LAB — LIPID PANEL
CHOL/HDL RATIO: 2.7 ratio (ref 0.0–4.4)
Cholesterol, Total: 165 mg/dL (ref 100–199)
HDL: 62 mg/dL (ref >39)
LDL CALC: 83 mg/dL (ref 0–99)
Triglycerides: 100 mg/dL (ref 0–149)
VLDL CHOLESTEROL CAL: 20 mg/dL (ref 5–40)

## 2015-09-29 LAB — VITAMIN D 25 HYDROXY (VIT D DEFICIENCY, FRACTURES): Vit D, 25-Hydroxy: 31.7 ng/mL (ref 30.0–100.0)

## 2015-10-04 ENCOUNTER — Encounter: Payer: Self-pay | Admitting: Primary Care

## 2015-10-04 ENCOUNTER — Ambulatory Visit (INDEPENDENT_AMBULATORY_CARE_PROVIDER_SITE_OTHER): Payer: Medicare Other | Admitting: Primary Care

## 2015-10-04 VITALS — BP 150/84 | HR 88 | Temp 97.5°F | Ht 65.0 in | Wt 158.4 lb

## 2015-10-04 DIAGNOSIS — I10 Essential (primary) hypertension: Secondary | ICD-10-CM

## 2015-10-04 DIAGNOSIS — F411 Generalized anxiety disorder: Secondary | ICD-10-CM | POA: Diagnosis not present

## 2015-10-04 DIAGNOSIS — E039 Hypothyroidism, unspecified: Secondary | ICD-10-CM

## 2015-10-04 DIAGNOSIS — E785 Hyperlipidemia, unspecified: Secondary | ICD-10-CM

## 2015-10-04 DIAGNOSIS — D693 Immune thrombocytopenic purpura: Secondary | ICD-10-CM

## 2015-10-04 NOTE — Assessment & Plan Note (Addendum)
Stable on Crestor 5 mg. Continue same. LFTs within normal limits.

## 2015-10-04 NOTE — Assessment & Plan Note (Signed)
Blood pressure stable in clinic upon recheck. Continue Lopressor twice a day, HCTZ 25 mg, and amlodipine 5 mg.

## 2015-10-04 NOTE — Progress Notes (Signed)
Subjective:    Patient ID: Ashlee Lopez, female    DOB: 1933/05/13, 82 y.o.   MRN: [id number]  HPI  [name] is an 82 year old female who presents today for follow up.  1) Generalized Anxiety Disorder: Currently managed on Alprazolam that was initiated by her prior PCP that she takes everyday at 12pm. She takes the Alprazolam because it makes her feel better. We attempted to initiate Zoloft for anxiety in the past but she stopped taking as she didn't like they way she felt on this medication. She does not wish to stop taking alprazolam.  2) Essential Hypertension: Currently managed on Amlodipine 5 mg and HCTZ 25 mg. She does not routinely check her BP at home. Denies headaches, blurred vision, chest pain. She does experience occasional lightheadedness with abrupt position changes.  3) Hypothyroidism: Currently managed on levothyroxine 100 mcg. Recent TSH in February 2017 stable.   4) ITP: Recent CBC with normal platelets. Denies easy bruising, bleeding.  5) Hyperlipidemia: Currently managed on Crestor 5 mg. Recent lipid panel stable. She is working to improve her diet. She's reducing the salt and sugar in her foods. She's also limited her consumption of junk food.  Review of Systems  Eyes: Negative for visual disturbance.  Respiratory: Negative for shortness of breath.   Cardiovascular: Negative for chest pain.  Neurological: Negative for dizziness and headaches.  Psychiatric/Behavioral: The patient is not nervous/anxious.        Past Medical History  Diagnosis Date  . ITP (idiopathic thrombocytopenic purpura) 03/21/2011  . S/P splenectomy 03/21/2011  . Benign essential HTN 03/21/2011  . Hypothyroid 03/21/2011  . Graves' disease with exophthalmos 03/23/2011  . Diverticulosis of colon 03/23/2011  . IBS (irritable bowel syndrome) 03/23/2011  . Fibrocystic disease of breast 03/23/2011  . Varicose vein of leg 03/23/2011  . Hypertension   . Cancer ([employer])   . Breast cancer, stage 1 ([place]) 02/10/2003     Right tubular breast cancer  . Uterus cancer ([place]) 03/23/1999    Well differentiated AdenoCA of endometrium-superficially confined     Social History   Social History  . Marital Status: Widowed    Spouse Name: N/A  . Number of Children: N/A  . Years of Education: N/A   Occupational History  . Not on file.   Social History Main Topics  . Smoking status: Never Smoker   . Smokeless tobacco: Not on file  . Alcohol Use: No  . Drug Use: No  . Sexual Activity: No   Other Topics Concern  . Not on file   Social History Narrative   Widow. Lives alone.    3 children, 5 grandchildren.   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   Enjoys reading, watching TV.    Past Surgical History  Procedure Laterality Date  . Appendectomy  1941  . Splenectomy  1986  . Abdominal hysterectomy  2001  . Mastectomy  2004    Dr Margot Chimes  . Oophorectomy      BSO  . Breast surgery  02/17/2003    Mastectomy-Right    Family History  Problem Relation Age of Onset  . Breast cancer Mother     Age 79  . Hypertension Father   . Heart disease Father   . Lung cancer Father   . Breast cancer Sister     Age 27  . Dementia Sister     Allergies  Allergen Reactions  . Sulfamethoxazole-Trimethoprim Hives  . Septra [Bactrim]  Current Outpatient Prescriptions on File Prior to Visit  Medication Sig Dispense Refill  . ALPRAZolam (XANAX) 0.5 MG tablet TAKE 1 TABLET BY MOUTH DAILY AS NEEDED FOR ANXIETY 30 tablet 0  . amLODipine (NORVASC) 5 MG tablet Take 1 tablet (5 mg total) by mouth daily. 90 tablet 2  . aspirin 81 MG tablet Take 81 mg by mouth daily.      Marland Kitchen CALCIUM-VITAMIN D PO Take by mouth.      . Diphenhydramine-APAP, sleep, (TYLENOL PM EXTRA STRENGTH PO) Take by mouth.      . hydrochlorothiazide (HYDRODIURIL) 25 MG tablet Take 1 tablet (25 mg total) by mouth daily. 90 tablet 2  . levothyroxine (SYNTHROID, LEVOTHROID) 100 MCG tablet Take 1 tablet (100 mcg total) by mouth daily. 90 tablet 2  .  metoprolol (LOPRESSOR) 50 MG tablet Take 1 tablet (50 mg total) by mouth 2 (two) times daily. 180 tablet 2  . Multiple Vitamin (MULTI-VITAMIN PO) Take by mouth.      . ranitidine (ZANTAC) 150 MG tablet Take 1 tablet (150 mg total) by mouth daily. 90 tablet 2  . rosuvastatin (CRESTOR) 5 MG tablet Take 5 mg by mouth every other day.      No current facility-administered medications on file prior to visit.    BP 150/84 mmHg  Pulse 88  Temp(Src) 97.5 F (36.4 C) (Oral)  Ht 5\' 5"  (1.651 m)  Wt 158 lb 6.4 oz (71.85 kg)  BMI 26.36 kg/m2  SpO2 97%    Objective:   Physical Exam  Constitutional: She appears well-nourished.  Neck: Neck supple.  Cardiovascular: Normal rate and regular rhythm.   Pulmonary/Chest: Effort normal and breath sounds normal.  Skin: Skin is warm and dry.  Psychiatric: She has a normal mood and affect.          Assessment & Plan:

## 2015-10-04 NOTE — Assessment & Plan Note (Signed)
Did not like Zoloft so she stopped taking months ago. She does not wish to come off of her Xanax as she likes the way she feels when she takes it. Discussed effects of long-term use of Xanax and also side effects of falls, dizziness. She will try to wean down on Xanax use by cutting her tablets in half. Discussed that I prefer she wean off entirely, she will consider.

## 2015-10-04 NOTE — Assessment & Plan Note (Signed)
Recent CBC stable

## 2015-10-04 NOTE — Assessment & Plan Note (Signed)
TSH in February 2017 stable. Continue levothyroxine 100 g.

## 2015-10-04 NOTE — Progress Notes (Signed)
Pre visit review using our clinic review tool, if applicable. No additional management support is needed unless otherwise documented below in the visit note. 

## 2015-10-04 NOTE — Patient Instructions (Addendum)
Try to reduce the dose of your Xanax. Cut the tablets in half and take 1/2 tablet by mouth daily for 2 weeks. If you feel okay on this dose, then try taking 1/2 tablet by mouth every other day for 2 weeks.   Start Vitamin D 1000 units daily. Take 1 capsule by mouth everyday. Continue your calcium and vitamin d pill.  Start a daily antihistamine such as Claritin, Zyrtec, or Allegra. Purchase the cheapest version of these tablets.  Follow up in 1 year for repeat physical or sooner if needed.  It was a pleasure to see you today!

## 2015-10-20 ENCOUNTER — Other Ambulatory Visit: Payer: Self-pay | Admitting: Primary Care

## 2015-10-20 NOTE — Telephone Encounter (Signed)
Received faxed refill request for   Xanax 0.5 tablet Take 1 tablet by mouth daily as needed  Last prescribed on 09/12/2015. Last seen on 10/04/2015. No future appointment.

## 2015-10-21 MED ORDER — ALPRAZOLAM 0.5 MG PO TABS
0.5000 mg | ORAL_TABLET | Freq: Every day | ORAL | 0 refills | Status: DC | PRN
Start: 2015-10-21 — End: 2015-12-21

## 2015-10-21 NOTE — Telephone Encounter (Signed)
Called in Xanax to pharmacy.

## 2015-11-08 ENCOUNTER — Encounter: Payer: Self-pay | Admitting: Genetic Counselor

## 2015-11-09 ENCOUNTER — Other Ambulatory Visit: Payer: Medicare Other

## 2015-11-09 ENCOUNTER — Ambulatory Visit (HOSPITAL_BASED_OUTPATIENT_CLINIC_OR_DEPARTMENT_OTHER): Payer: Medicare Other | Admitting: Genetic Counselor

## 2015-11-09 ENCOUNTER — Encounter: Payer: Self-pay | Admitting: Genetic Counselor

## 2015-11-09 DIAGNOSIS — Z800 Family history of malignant neoplasm of digestive organs: Secondary | ICD-10-CM | POA: Diagnosis not present

## 2015-11-09 DIAGNOSIS — C50912 Malignant neoplasm of unspecified site of left female breast: Secondary | ICD-10-CM | POA: Diagnosis not present

## 2015-11-09 DIAGNOSIS — Z808 Family history of malignant neoplasm of other organs or systems: Secondary | ICD-10-CM | POA: Diagnosis not present

## 2015-11-09 DIAGNOSIS — Z853 Personal history of malignant neoplasm of breast: Secondary | ICD-10-CM | POA: Diagnosis not present

## 2015-11-09 DIAGNOSIS — C55 Malignant neoplasm of uterus, part unspecified: Secondary | ICD-10-CM | POA: Diagnosis not present

## 2015-11-09 DIAGNOSIS — Z315 Encounter for genetic counseling: Secondary | ICD-10-CM | POA: Diagnosis present

## 2015-11-09 DIAGNOSIS — Z8042 Family history of malignant neoplasm of prostate: Secondary | ICD-10-CM

## 2015-11-09 DIAGNOSIS — Z803 Family history of malignant neoplasm of breast: Secondary | ICD-10-CM | POA: Diagnosis not present

## 2015-11-09 DIAGNOSIS — Z8542 Personal history of malignant neoplasm of other parts of uterus: Secondary | ICD-10-CM | POA: Diagnosis not present

## 2015-11-09 NOTE — Progress Notes (Signed)
REFERRING PROVIDER: Pleas Koch, NP [address], [place] [address]   PRIMARY PROVIDER:  Sheral Flow, NP  PRIMARY REASON FOR VISIT:  1. Breast cancer, stage 1, left ([place])   2. Family history of breast cancer   3. Family history of prostate cancer   4. Family history of colon cancer   5. Family history of melanoma   6. Malignant neoplasm of uterus, unspecified site [facility])      HISTORY OF PRESENT ILLNESS:   Ms. Lauderbaugh, a 82 y.o. female, was seen for a [place] cancer genetics consultation at the request of Dr. Carlis Abbott due to a personal and family history of cancer.  Ms. Starry presents to clinic today to discuss the possibility of a hereditary predisposition to cancer, genetic testing, and to further clarify her future cancer risks, as well as potential cancer risks for family members.   In 2001, at the age of 57, Ms. Mcbrayer was diagnosed with endometrial cancer. This was treated with complete hysterectomy.  In 2007, at the age of 22,  Ms. Giroux was diagnosed with breast cancer.  She underwent genetic testing for BRCA1 and BRCA2 at the time and was negative.    CANCER HISTORY:   No history exists.     HORMONAL RISK FACTORS:  Menarche was at age 65.  First live birth at age 4.  OCP use for approximately 0 years.  Ovaries intact: no.  Hysterectomy: yes.  Menopausal status: postmenopausal.  HRT use: 1-2 years years. Colonoscopy: yes; 2 polyps. Mammogram within the last year: yes. Number of breast biopsies: 2-3. Up to date with pelvic exams:  Does not need them anymore. Any excessive radiation exposure in the past:  no  Past Medical History:  Diagnosis Date  . Benign essential HTN 03/21/2011  . Breast cancer, stage 1 ([place]) 02/10/2003   Right tubular breast cancer  . Cancer ([place])   . Diverticulosis of colon 03/23/2011  . Family history of breast cancer   . Family history of colon cancer   . Family history of melanoma   . Family history of prostate  cancer   . Fibrocystic disease of breast 03/23/2011  . Graves' disease with exophthalmos 03/23/2011  . Hypertension   . Hypothyroid 03/21/2011  . IBS (irritable bowel syndrome) 03/23/2011  . ITP (idiopathic thrombocytopenic purpura) 03/21/2011  . S/P splenectomy 03/21/2011  . Uterus cancer ([place]) 03/23/1999   Well differentiated AdenoCA of endometrium-superficially confined  . Varicose vein of leg 03/23/2011    Past Surgical History:  Procedure Laterality Date  . ABDOMINAL HYSTERECTOMY  2001  . APPENDECTOMY  1941  . BREAST SURGERY  02/17/2003   Mastectomy-Right  . MASTECTOMY  2004   Dr Margot Chimes  . OOPHORECTOMY     BSO  . SPLENECTOMY  1986    Social History   Social History  . Marital status: Widowed    Spouse name: N/A  . Number of children: N/A  . Years of education: N/A   Social History Main Topics  . Smoking status: Never Smoker  . Smokeless tobacco: Never Used  . Alcohol use No  . Drug use: No  . Sexual activity: No   Other Topics Concern  . None   Social History Narrative   Widow. Lives alone.    3 children, 5 grandchildren.   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   Enjoys reading, watching TV.     FAMILY HISTORY:  We obtained a detailed, 4-generation family history.  Significant diagnoses  are listed below: Family History  Problem Relation Age of Onset  . Breast cancer Mother     Age 59  . Hypertension Father   . Heart disease Father   . Lung cancer Father   . Breast cancer Sister     Age 83  . Melanoma Sister     dx in her 50s  . Dementia Sister   . Heart disease Maternal Aunt   . Prostate cancer Maternal Uncle   . Colon cancer Maternal Grandfather   . Colon cancer Maternal Uncle   . Prostate cancer Maternal Uncle   . Dementia Maternal Aunt   . Breast cancer Cousin     maternal 2nd cousin  . Breast cancer Cousin     maternal first cousin dx in her 2s  . Melanoma Son     dx in his 61s    The patient has three children, two sons and a daughter.  One son  was diagnosed with melanoma in his 76s and the other son was diagnosed with colon polyps and has colonoscopies every 5 years.  The patient's sister was diagnosed with melanoma in her 5s and breast cancer at 76.  The patinet's mother was diagnosed with breast cancer at 33, and her father was diagnosed with lung cancer in his 76s.  Her mothe rhad three brothers and two sisters.  Two brothers had prostate cancer and the third had colon cancer.  One sister had a daughter with breast cancer in her 44s.  The patient's maternal grandfather had colon cancer and his sister had a granddaughter with breast cancer.  There is no other reported family history of cancer.  Patient's maternal ancestors are of [place] and [place] descent, and paternal ancestors are of Caucasian descent. There is no reported Ashkenazi Jewish ancestry. There is no known consanguinity.  GENETIC COUNSELING ASSESSMENT: EARL ZELLMER is a 82 y.o. female with a personal history of uterine and breast cancer and a family history of breast, prostate and colon cancer and melanoma which is somewhat suggestive of a hereditary cancer syndrome and predisposition to cancer. We, therefore, discussed and recommended the following at today's visit.   DISCUSSION: We discussed that about 5-10% of breast cancer is hereditary with most cases due to BRCA mutations.  We discussed that she had negative BRCA testing in the past, and that most likely, even with the updated testing, this will be negative since most hereditary mutations were tested for at that time.  Based on her family history we also discussed other genes associated with hereditary breast cancer syndromes and the cancers in her family including ATM, PALB2, CHEK2, PTEN and Lynch syndrome.  We reviewed the characteristics, features and inheritance patterns of hereditary cancer syndromes. We also discussed genetic testing, including the appropriate family members to test, the process of testing, insurance  coverage and turn-around-time for results. We discussed the implications of a negative, positive and/or variant of uncertain significant result. We recommended Ms. Wisnieski pursue genetic testing for the [facility] gene panel. The [facility] gene panel offered by [employer] includes sequencing and deletion/duplication testing of the following 27 genes: APC, ATM, BARD1, BMPR1A, BRCA1, BRCA2, BRIP1, CHD1, CDK4, CDKN2A, CHEK2, EPCAM (large rearrangement only), MLH1, MSH2, MSH6, MUTYH, NBN, PALB2, PMS2, PTEN, RAD51C, RAD51D, SMAD4, STK11, and TP53. Sequencing was performed for select regions of POLE and POLD1, and large rearrangement analysis was performed for select regions of GREM1.   Based on Ms. Stockdale's personal and family history of cancer,  she meets medical criteria for genetic testing. Despite that she meets criteria, she may still have an out of pocket cost. We discussed that if her out of pocket cost for testing is over $100, the laboratory will call and confirm whether she wants to proceed with testing.  If the out of pocket cost of testing is less than $100 she will be billed by the genetic testing laboratory.   PLAN: After considering the risks, benefits, and limitations, Ms. Yontz  provided informed consent to pursue genetic testing and the blood sample was sent to [facility] for analysis of the [facility] panel testing. Results should be available within approximately 2-3 weeks' time, at which point they will be disclosed by telephone to Ms. Szczesny, as will any additional recommendations warranted by these results. Ms. Loria will receive a summary of her genetic counseling visit and a copy of her results once available. This information will also be available in Epic. We encouraged Ms. Bonds to remain in contact with cancer genetics annually so that we can continuously update the family history and inform her of any changes in cancer genetics and testing that may be of benefit for her  family. Ms. Alcon questions were answered to her satisfaction today. Our contact information was provided should additional questions or concerns arise.  Lastly, we encouraged Ms. Catanzaro to remain in contact with cancer genetics annually so that we can continuously update the family history and inform her of any changes in cancer genetics and testing that may be of benefit for this family.   Ms.  Trabert questions were answered to her satisfaction today. Our contact information was provided should additional questions or concerns arise. Thank you for the referral and allowing [place] to share in the care of your patient.   Tiarrah Saville P. Florene Glen, [place], [facility] Certified Genetic Counselor [email or web address] phone: [phone]  The patient was seen for a total of 60 minutes in face-to-face genetic counseling.  This patient was discussed with Drs. Magrinat, Lindi Adie and/or Burr Medico who agrees with the above.    _______________________________________________________________________ For Office Staff:  Number of people involved in session: 2 Was an Intern/ student involved with case: no

## 2015-11-24 ENCOUNTER — Encounter: Payer: Self-pay | Admitting: Genetic Counselor

## 2015-11-24 ENCOUNTER — Telehealth: Payer: Self-pay | Admitting: Genetic Counselor

## 2015-11-24 DIAGNOSIS — Z1379 Encounter for other screening for genetic and chromosomal anomalies: Secondary | ICD-10-CM | POA: Insufficient documentation

## 2015-11-24 NOTE — Telephone Encounter (Signed)
LM on VM with good news on test results.  Asked that she CB.  Left CB instructions. 

## 2015-11-25 ENCOUNTER — Ambulatory Visit: Payer: Self-pay | Admitting: Genetic Counselor

## 2015-11-25 ENCOUNTER — Telehealth: Payer: Self-pay | Admitting: Genetic Counselor

## 2015-11-25 DIAGNOSIS — Z1379 Encounter for other screening for genetic and chromosomal anomalies: Secondary | ICD-10-CM

## 2015-11-25 DIAGNOSIS — Z803 Family history of malignant neoplasm of breast: Secondary | ICD-10-CM

## 2015-11-25 DIAGNOSIS — C55 Malignant neoplasm of uterus, part unspecified: Secondary | ICD-10-CM

## 2015-11-25 DIAGNOSIS — Z800 Family history of malignant neoplasm of digestive organs: Secondary | ICD-10-CM

## 2015-11-25 DIAGNOSIS — C50912 Malignant neoplasm of unspecified site of left female breast: Secondary | ICD-10-CM

## 2015-11-25 DIAGNOSIS — Z808 Family history of malignant neoplasm of other organs or systems: Secondary | ICD-10-CM

## 2015-11-25 DIAGNOSIS — Z8042 Family history of malignant neoplasm of prostate: Secondary | ICD-10-CM

## 2015-11-25 NOTE — Telephone Encounter (Signed)
Revealed negative genetic testing on the [facility] panel test.  Discussed that we do not know why she developed cancer twice or why there is cancer in the family.  There could be another gene that we did not test, or possibly our technology is not good enough to pick up something.  We will keep in contact if there is something that may help her, and we asked that she keep in contact with [place] as well.

## 2015-11-25 NOTE — Progress Notes (Signed)
HPI: Ashlee Lopez was previously seen in the [place] clinic due to a personal and family history of cancer and concerns regarding a hereditary predisposition to cancer. Please refer to our prior cancer genetics clinic note for more information regarding Ashlee Lopez's medical, social and family histories, and our assessment and recommendations, at the time. Ashlee Lopez recent genetic test results were disclosed to her, as were recommendations warranted by these results. These results and recommendations are discussed in more detail below.  FAMILY HISTORY:  We obtained a detailed, 4-generation family history.  Significant diagnoses are listed below: Family History  Problem Relation Age of Onset  . Breast cancer Mother     Age 80  . Hypertension Father   . Heart disease Father   . Lung cancer Father   . Breast cancer Sister     Age 60  . Melanoma Sister     dx in her 61s  . Dementia Sister   . Heart disease Maternal Aunt   . Prostate cancer Maternal Uncle   . Colon cancer Maternal Grandfather   . Colon cancer Maternal Uncle   . Prostate cancer Maternal Uncle   . Dementia Maternal Aunt   . Breast cancer Cousin     maternal 2nd cousin  . Breast cancer Cousin     maternal first cousin dx in her 24s  . Melanoma Son     dx in his 47s    The patient has three children, two sons and a daughter.  One son was diagnosed with melanoma in his 64s and the other son was diagnosed with colon polyps and has colonoscopies every 5 years.  The patient's sister was diagnosed with melanoma in her 30s and breast cancer at 51.  The patinet's mother was diagnosed with breast cancer at 22, and her father was diagnosed with lung cancer in his 80s.  Her mothe rhad three brothers and two sisters.  Two brothers had prostate cancer and the third had colon cancer.  One sister had a daughter with breast cancer in her 55s.  The patient's maternal grandfather had colon cancer and his sister had a  granddaughter with breast cancer.  There is no other reported family history of cancer.  Patient's maternal ancestors are of [place] and [place] descent, and paternal ancestors are of Caucasian descent. There is no reported Ashkenazi Jewish ancestry. There is no known consanguinity.  GENETIC TEST RESULTS: At the time of Ashlee Lopez's visit, we recommended she pursue genetic testing of the [facility] gene panel. The [facility] gene panel offered by [employer] includes sequencing and deletion/duplication testing of the following 27 genes: APC, ATM, BARD1, BMPR1A, BRCA1, BRCA2, BRIP1, CHD1, CDK4, CDKN2A, CHEK2, EPCAM (large rearrangement only), MLH1, MSH2, MSH6, MUTYH, NBN, PALB2, PMS2, PTEN, RAD51C, RAD51D, SMAD4, STK11, and TP53. Sequencing was performed for select regions of POLE and POLD1, and large rearrangement analysis was performed for select regions of GREM1.  The report date is November 22, 2015.  Genetic testing was normal, and did not reveal a deleterious mutation in these genes. The test report has been scanned into EPIC and is located under the Molecular Pathology section of the Results Review tab.   We discussed with Ashlee Lopez that since the current genetic testing is not perfect, it is possible there may be a gene mutation in one of these genes that current testing cannot detect, but that chance is small. We also discussed, that it is possible that another gene that  has not yet been discovered, or that we have not yet tested, is responsible for the cancer diagnoses in the family, and it is, therefore, important to remain in touch with cancer genetics in the future so that we can continue to offer Ashlee Lopez the most up to date genetic testing.   CANCER SCREENING RECOMMENDATIONS: Given Ashlee Lopez's personal and family histories, we must interpret these negative results with some caution.  Families with features suggestive of hereditary risk for cancer tend to have multiple family  members with cancer, diagnoses in multiple generations and diagnoses before the age of 4. Ashlee Lopez's family exhibits some of these features. Thus this result may simply reflect our current inability to detect all mutations within these genes or there may be a different gene that has not yet been discovered or tested.   RECOMMENDATIONS FOR FAMILY MEMBERS: Women in this family might be at some increased risk of developing cancer, over the general population risk, simply due to the family history of cancer. We recommended women in this family have a yearly mammogram beginning at age 88, or 57 years younger than the earliest onset of cancer, an an annual clinical breast exam, and perform monthly breast self-exams. Women in this family should also have a gynecological exam as recommended by their primary provider. All family members should have a colonoscopy by age 63.  FOLLOW-UP: Lastly, we discussed with Ashlee Lopez that cancer genetics is a rapidly advancing field and it is possible that new genetic tests will be appropriate for her and/or her family members in the future. We encouraged her to remain in contact with cancer genetics on an annual basis so we can update her personal and family histories and let her know of advances in cancer genetics that may benefit this family.   Our contact number was provided. Ashlee Lopez questions were answered to her satisfaction, and she knows she is welcome to call us at anytime with additional questions or concerns.   Roma Kayser, MS, [facility] Certified Genetic Counselor [email or web address]

## 2015-12-21 ENCOUNTER — Other Ambulatory Visit: Payer: Self-pay | Admitting: Primary Care

## 2015-12-21 MED ORDER — ALPRAZOLAM 0.5 MG PO TABS: 0.5000 mg | ORAL_TABLET | Freq: Every day | ORAL | 0 refills | Status: DC | PRN

## 2015-12-21 NOTE — Telephone Encounter (Signed)
Received faxed refill request for  ALPRAZolam Duanne Moron) 0.5 MG tablet  Last prescribed on 10/21/2015. Last seen on 10/04/2015.

## 2015-12-22 NOTE — Telephone Encounter (Signed)
Spoken to patient and she is taking 1/2 tablet every day now.

## 2015-12-22 NOTE — Telephone Encounter (Signed)
Called in medication to the pharmacy as instructed. 

## 2015-12-27 DIAGNOSIS — Z23 Encounter for immunization: Secondary | ICD-10-CM | POA: Diagnosis not present

## 2016-01-04 DIAGNOSIS — H2513 Age-related nuclear cataract, bilateral: Secondary | ICD-10-CM | POA: Diagnosis not present

## 2016-02-27 ENCOUNTER — Other Ambulatory Visit: Payer: Self-pay | Admitting: Primary Care

## 2016-02-27 NOTE — Telephone Encounter (Signed)
Ok to refill? Last filled 12/21/15 #[id number]

## 2016-02-27 NOTE — Telephone Encounter (Signed)
Rx called in as directed.   

## 2016-03-05 ENCOUNTER — Other Ambulatory Visit: Payer: Self-pay | Admitting: Primary Care

## 2016-03-05 DIAGNOSIS — E785 Hyperlipidemia, unspecified: Secondary | ICD-10-CM

## 2016-03-05 NOTE — Telephone Encounter (Signed)
Ok to refill? Electronically refill request for    ROSUVASTATIN CALCIUM 10MG    TAKE 1/2 TABLET BY MOUTH EVERY OTHER DAY  Medication have not been prescribed by Anda Kraft. Last seen on 10/04/2015.

## 2016-03-06 ENCOUNTER — Other Ambulatory Visit: Payer: Self-pay | Admitting: Primary Care

## 2016-03-06 MED ORDER — ROSUVASTATIN CALCIUM 5 MG PO TABS: 5.0000 mg | ORAL_TABLET | ORAL | 0 refills | Status: DC

## 2016-03-23 DIAGNOSIS — L821 Other seborrheic keratosis: Secondary | ICD-10-CM | POA: Diagnosis not present

## 2016-03-23 DIAGNOSIS — L308 Other specified dermatitis: Secondary | ICD-10-CM | POA: Diagnosis not present

## 2016-03-23 DIAGNOSIS — L814 Other melanin hyperpigmentation: Secondary | ICD-10-CM | POA: Diagnosis not present

## 2016-03-23 DIAGNOSIS — L905 Scar conditions and fibrosis of skin: Secondary | ICD-10-CM | POA: Diagnosis not present

## 2016-03-23 DIAGNOSIS — D1801 Hemangioma of skin and subcutaneous tissue: Secondary | ICD-10-CM | POA: Diagnosis not present

## 2016-03-23 DIAGNOSIS — L720 Epidermal cyst: Secondary | ICD-10-CM | POA: Diagnosis not present

## 2016-03-23 DIAGNOSIS — L7211 Pilar cyst: Secondary | ICD-10-CM | POA: Diagnosis not present

## 2016-04-02 ENCOUNTER — Other Ambulatory Visit: Payer: Self-pay | Admitting: Primary Care

## 2016-04-10 ENCOUNTER — Encounter: Payer: Self-pay | Admitting: Primary Care

## 2016-04-10 ENCOUNTER — Ambulatory Visit (INDEPENDENT_AMBULATORY_CARE_PROVIDER_SITE_OTHER): Payer: Medicare Other | Admitting: Primary Care

## 2016-04-10 VITALS — BP 138/80 | HR 94 | Temp 98.1°F | Ht 65.0 in | Wt 155.8 lb

## 2016-04-10 DIAGNOSIS — N390 Urinary tract infection, site not specified: Secondary | ICD-10-CM | POA: Diagnosis not present

## 2016-04-10 DIAGNOSIS — F411 Generalized anxiety disorder: Secondary | ICD-10-CM

## 2016-04-10 DIAGNOSIS — R8290 Unspecified abnormal findings in urine: Secondary | ICD-10-CM | POA: Diagnosis not present

## 2016-04-10 DIAGNOSIS — R319 Hematuria, unspecified: Secondary | ICD-10-CM | POA: Diagnosis not present

## 2016-04-10 LAB — POC URINALSYSI DIPSTICK (AUTOMATED)
BILIRUBIN UA: NEGATIVE
Glucose, UA: NEGATIVE
KETONES UA: NEGATIVE
Nitrite, UA: POSITIVE
PH UA: 6
Urobilinogen, UA: NEGATIVE

## 2016-04-10 MED ORDER — CITALOPRAM HYDROBROMIDE 10 MG PO TABS: 10.0000 mg | ORAL_TABLET | Freq: Every day | ORAL | 1 refills | Status: DC

## 2016-04-10 MED ORDER — CEPHALEXIN 500 MG PO CAPS: 500.0000 mg | ORAL_CAPSULE | Freq: Two times a day (BID) | ORAL | 0 refills | Status: DC

## 2016-04-10 NOTE — Assessment & Plan Note (Signed)
Ongoing for the past several months. Long discussion today regarding treatment options.  Will try low dose Celexa. Patient is to take 1/2 tablet daily for 6 days, then advance to 1 full tablet thereafter. We discussed possible side effects of headache, GI upset, drowsiness, and SI/HI. If thoughts of SI/HI develop, we discussed to present to the emergency immediately. Patient verbalized understanding.   Follow up in 6 weeks for re-evaluation.

## 2016-04-10 NOTE — Progress Notes (Signed)
Subjective:    Patient ID: Ashlee Lopez, female    DOB: 1933/04/26, 83 y.o.   MRN: [id number]  HPI  Ashlee Lopez is an 83 year old female who presents today with a chief complaint of fouls smelling urine. She also reports pelvic pain, low back pain, vomiting, diarrhea, urinary frequency. The diarrhea and vomiting began yesterday which stopped this morning. Her symptoms have been present since Saturday evening. She denies hematuria. She's not taken anything OTC.  2) GAD: Long history of anxiety and historically managed on Alprazolam. During our initial visit we discussed other treatment options including SSRI's. She was initiated on Zoloft but stopped taking it after several days as it caused her to feel "awful". Over the past 4-5 months she's continued to experience ongoing anxiety. She is ready to try another medication today for anxiety as she finds that the Alprazolam doesn't help very long.   Review of Systems  Constitutional: Negative for fatigue and fever.  Gastrointestinal: Positive for diarrhea, nausea and vomiting.  Genitourinary: Positive for dysuria, flank pain, frequency and pelvic pain. Negative for hematuria and vaginal discharge.  Psychiatric/Behavioral: The patient is nervous/anxious.        Past Medical History:  Diagnosis Date  . Benign essential HTN 03/21/2011  . Breast cancer, stage 1 ([place]) 02/10/2003   Right tubular breast cancer  . Cancer ([place])   . Diverticulosis of colon 03/23/2011  . Family history of breast cancer   . Family history of colon cancer   . Family history of melanoma   . Family history of prostate cancer   . Fibrocystic disease of breast 03/23/2011  . Graves' disease with exophthalmos 03/23/2011  . Hypertension   . Hypothyroid 03/21/2011  . IBS (irritable bowel syndrome) 03/23/2011  . ITP (idiopathic thrombocytopenic purpura) 03/21/2011  . S/P splenectomy 03/21/2011  . Uterus cancer ([place]) 03/23/1999   Well differentiated AdenoCA of endometrium-superficially  confined  . Varicose vein of leg 03/23/2011     Social History   Social History  . Marital status: Widowed    Spouse name: N/A  . Number of children: N/A  . Years of education: N/A   Occupational History  . Not on file.   Social History Main Topics  . Smoking status: Never Smoker  . Smokeless tobacco: Never Used  . Alcohol use No  . Drug use: No  . Sexual activity: No   Other Topics Concern  . Not on file   Social History Narrative   Widow. Lives alone.    3 children, 5 grandchildren.   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   Enjoys reading, watching TV.    Past Surgical History:  Procedure Laterality Date  . ABDOMINAL HYSTERECTOMY  2001  . APPENDECTOMY  1941  . BREAST SURGERY  02/17/2003   Mastectomy-Right  . MASTECTOMY  2004   Dr Margot Chimes  . OOPHORECTOMY     BSO  . SPLENECTOMY  1986    Family History  Problem Relation Age of Onset  . Breast cancer Mother     Age 98  . Hypertension Father   . Heart disease Father   . Lung cancer Father   . Breast cancer Sister     Age 73  . Melanoma Sister     dx in her 43s  . Dementia Sister   . Heart disease Maternal Aunt   . Prostate cancer Maternal Uncle   . Colon cancer Maternal Grandfather   . Colon cancer Maternal Uncle   .  Prostate cancer Maternal Uncle   . Dementia Maternal Aunt   . Breast cancer Cousin     maternal 2nd cousin  . Breast cancer Cousin     maternal first cousin dx in her 81s  . Melanoma Son     dx in his 22s    Allergies  Allergen Reactions  . Sulfamethoxazole-Trimethoprim Hives  . Septra [Bactrim]     Current Outpatient Prescriptions on File Prior to Visit  Medication Sig Dispense Refill  . amLODipine (NORVASC) 5 MG tablet Take 1 tablet (5 mg total) by mouth daily. 90 tablet 2  . aspirin 81 MG tablet Take 81 mg by mouth daily.      Marland Kitchen CALCIUM-VITAMIN D PO Take by mouth.      . Diphenhydramine-APAP, sleep, (TYLENOL PM EXTRA STRENGTH PO) Take by mouth.      . hydrochlorothiazide  (HYDRODIURIL) 25 MG tablet TAKE 1 TABLET BY MOUTH ONCE A DAY 90 tablet 1  . levothyroxine (SYNTHROID, LEVOTHROID) 100 MCG tablet TAKE 1 TABLET BY MOUTH ONCE A DAY 90 tablet 1  . metoprolol (LOPRESSOR) 50 MG tablet TAKE 1 TABLET BY MOUTH TWICE A DAY 180 tablet 1  . Multiple Vitamin (MULTI-VITAMIN PO) Take by mouth.      . ranitidine (ZANTAC) 150 MG tablet TAKE 1 TABLET BY MOUTH ONCE A DAY 90 tablet 1  . rosuvastatin (CRESTOR) 5 MG tablet Take 1 tablet (5 mg total) by mouth every other day. 90 tablet 0  . ALPRAZolam (XANAX) 0.5 MG tablet TAKE 1 TABLET BY MOUTH ONCE DAILY AS NEEDED FOR ANXIETY (Patient not taking: Reported on 04/10/2016) 30 tablet 0   No current facility-administered medications on file prior to visit.     BP 138/80   Pulse 94   Temp 98.1 F (36.7 C) (Oral)   Ht 5\' 5"  (1.651 m)   Wt 155 lb 12.8 oz (70.7 kg)   SpO2 96%   BMI 25.93 kg/m    Objective:   Physical Exam  Constitutional: She appears well-nourished. She does not appear ill.  Neck: Neck supple.  Cardiovascular: Normal rate and regular rhythm.   Pulmonary/Chest: Effort normal and breath sounds normal.  Abdominal: Soft. Bowel sounds are normal. There is no tenderness.  Skin: Skin is warm and dry.  Psychiatric: She has a normal mood and affect.          Assessment & Plan:  Urinary Tract Infection:  Dysuria, frequency, pelvic pain, low back pain x 3 days. Overall feeling worse.  Exam today unremarkable. UA: 3+ leuks, nitrites, trace blood. Culture sent. Rx for Cephalexin course sent to pharmacy. Fluids, rest, follow up PRN.  Sheral Flow, NP

## 2016-04-10 NOTE — Patient Instructions (Addendum)
Return your urine specimen as discussed.  Start citalopram 10 mg for anxiety. Take 1 tablet by mouth everyday for anxiety. Start by taking 1/2 tablet daily for 6 days, then advance to 1 full tablet thereafter.  Please schedule a follow up visit for anxiety in 6 weeks. Call me sooner if you have trouble with the citalopram medication.  It was a pleasure to see you today!

## 2016-04-10 NOTE — Progress Notes (Signed)
Pre visit review using our clinic review tool, if applicable. No additional management support is needed unless otherwise documented below in the visit note. 

## 2016-04-13 LAB — URINE CULTURE: Colony Count: >=100000

## 2016-04-20 ENCOUNTER — Telehealth: Payer: Self-pay | Admitting: Primary Care

## 2016-04-20 NOTE — Telephone Encounter (Signed)
Patient was seen for a UTI last week and told to call back and let Ashlee Lopez know how she's feeling.  Patient said she has they symptoms worse now then when she was at the office.  She has to frequent urination and burning.  Patient said she finished her medication on Monday. Patient said the medication for depression isn't working,either.

## 2016-04-20 NOTE — Telephone Encounter (Signed)
Spoken to patient. She stated that she still having some pelvic pain and lower back. Patient ask if she can have like a low dosage antibiotics for the re-currenting UTI. Patient stated she would go to urgent care if not better over the weekend or be seen on Monday. Patient stated that the citalopram is not working. It make her feel very nervous and bad. She went and took another Xanax today to help.

## 2016-04-20 NOTE — Telephone Encounter (Signed)
Please notify patient that the antibiotics should have taken care of the infection. I recommend she complete another urine specimen, especially if she's feeling worse. Please remind patient that it takes 6 weeks for the depression/anxiety medication to take full effect. It's too soon to know if the medication is working or not.

## 2016-04-22 NOTE — Telephone Encounter (Signed)
Please call and check on patient Monday 02/05. How are her symptoms? If still present, then needs to be seen again.

## 2016-04-23 ENCOUNTER — Encounter: Payer: Self-pay | Admitting: Primary Care

## 2016-04-23 ENCOUNTER — Ambulatory Visit (INDEPENDENT_AMBULATORY_CARE_PROVIDER_SITE_OTHER): Payer: Medicare Other | Admitting: Primary Care

## 2016-04-23 ENCOUNTER — Encounter (INDEPENDENT_AMBULATORY_CARE_PROVIDER_SITE_OTHER): Payer: Self-pay

## 2016-04-23 VITALS — BP 132/82 | HR 90 | Temp 98.2°F | Ht 65.0 in | Wt 155.8 lb

## 2016-04-23 DIAGNOSIS — R319 Hematuria, unspecified: Secondary | ICD-10-CM

## 2016-04-23 DIAGNOSIS — F411 Generalized anxiety disorder: Secondary | ICD-10-CM

## 2016-04-23 DIAGNOSIS — N390 Urinary tract infection, site not specified: Secondary | ICD-10-CM

## 2016-04-23 LAB — POC URINALSYSI DIPSTICK (AUTOMATED)
Bilirubin, UA: NEGATIVE
Glucose, UA: NEGATIVE
Ketones, UA: NEGATIVE
Nitrite, UA: POSITIVE
Spec Grav, UA: 1.015
UROBILINOGEN UA: NEGATIVE
pH, UA: 6.5

## 2016-04-23 MED ORDER — ALPRAZOLAM 0.5 MG PO TABS: ORAL_TABLET | ORAL | 0 refills | Status: DC

## 2016-04-23 MED ORDER — CIPROFLOXACIN HCL 250 MG PO TABS: 250.0000 mg | ORAL_TABLET | Freq: Two times a day (BID) | ORAL | 0 refills | Status: DC

## 2016-04-23 NOTE — Patient Instructions (Addendum)
You will be contacted regarding your referral to Urology.  Please let us know if you have not heard back within one week.   Use the alprazolam sparingly. Cut the tablet in half and only use this when feeling anxious.   Start Cipro 250 mg tablets for UTI. Take 1 tablet by mouth twice daily for 7 days.  You may take AZO as needed for burning and frequency. Ensure you are staying hydrated with water. Consider starting cranberry tablets once daily.  Follow up in July/August for your annual exam or sooner if needed.  It was a pleasure to see you today!

## 2016-04-23 NOTE — Progress Notes (Signed)
Pre visit review using our clinic review tool, if applicable. No additional management support is needed unless otherwise documented below in the visit note. 

## 2016-04-23 NOTE — Assessment & Plan Note (Addendum)
Stopped taking Citalopram after several days. Long discussion today regarding Xanax use and the potential long term side effects. She will work on reducing her use by breaking tablets in half and only use when needed. Refill provided today. Controlled substance contract obtained today, not enough urine to complete drug screen. Will obtain UDS next visit.

## 2016-04-23 NOTE — Telephone Encounter (Signed)
Patient made appointment on 04/23/2016

## 2016-04-23 NOTE — Progress Notes (Signed)
Subjective:    Patient ID: Ashlee Lopez, female    DOB: 1933-06-18, 83 y.o.   MRN: [id number]  HPI  Ashlee Lopez is an 83 year old female who presents today with a chief complaint of dysuria and for follow up.  1) Dysuria: She was evaluated and treated for urinary tract infection on 04/10/16 with cephalexin course. Her culture was sensitive to the cephalexin. She called into our clinic on 04/20/16 with complaints of urinary frequency and dysuria that was worse than before. She was encouraged to push oral fluids and follow back up in our office for repeat UA. She's had 5 urinary tract infections in 2017.   Overall her dysuria has improved but she continues to experience urinary frequency. She has never seen a Dealer for recurrent UTI's.   2) GAD: Long history of anxiety, with increased anxiety over the past several months. She's been on Xanax for years which was provided by her prior PCP. We've had several discussions about the addictive nature of this medication and that it's not first line treatment for symptoms. She was trialed on Zoloft which caused her to "feel bad" and stopped taking after several days. She was placed on low dose Citalopram last visit for which she stopped taking after several days as it "made her feel awful".   She's taking her Xanax once daily on average. She denies SI/HI.  Review of Systems  Constitutional: Negative for chills, fatigue and fever.  Genitourinary: Positive for dysuria and frequency. Negative for difficulty urinating, pelvic pain and vaginal discharge.  Psychiatric/Behavioral: The patient is nervous/anxious.        Past Medical History:  Diagnosis Date  . Benign essential HTN 03/21/2011  . Breast cancer, stage 1 ([place]) 02/10/2003   Right tubular breast cancer  . Cancer ([place])   . Diverticulosis of colon 03/23/2011  . Family history of breast cancer   . Family history of colon cancer   . Family history of melanoma   . Family history of prostate  cancer   . Fibrocystic disease of breast 03/23/2011  . Graves' disease with exophthalmos 03/23/2011  . Hypertension   . Hypothyroid 03/21/2011  . IBS (irritable bowel syndrome) 03/23/2011  . ITP (idiopathic thrombocytopenic purpura) 03/21/2011  . S/P splenectomy 03/21/2011  . 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 Varicose vein of leg 03/23/2011     Social History   Social History  . Marital status: Widowed    Spouse name: N/A  . Number of children: N/A  . Years of education: N/A   Occupational History  . Not on file.   Social History Main Topics  . Smoking status: Never Smoker  . Smokeless tobacco: Never Used  . Alcohol use No  . Drug use: No  . Sexual activity: No   Other Topics Concern  . Not on file   Social History Narrative   Widow. Lives alone.    3 children, 5 grandchildren.   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   Enjoys reading, watching TV.    Past Surgical History:  Procedure Laterality Date  . ABDOMINAL HYSTERECTOMY  2001  . APPENDECTOMY  1941  . BREAST SURGERY  02/17/2003   Mastectomy-Right  . MASTECTOMY  2004   Dr Margot Chimes  . OOPHORECTOMY     BSO  . SPLENECTOMY  1986    Family History  Problem Relation Age of Onset  . Breast cancer Mother     Age 23  .  Hypertension Father   . Heart disease Father   . Lung cancer Father   . Breast cancer Sister     Age 25  . Melanoma Sister     dx in her 46s  . Dementia Sister   . Heart disease Maternal Aunt   . Prostate cancer Maternal Uncle   . Colon cancer Maternal Grandfather   . Colon cancer Maternal Uncle   . Prostate cancer Maternal Uncle   . Dementia Maternal Aunt   . Breast cancer Cousin     maternal 2nd cousin  . Breast cancer Cousin     maternal first cousin dx in her 104s  . Melanoma Son     dx in his 80s    Allergies  Allergen Reactions  . Sulfamethoxazole-Trimethoprim Hives  . Septra [Bactrim]     Current Outpatient Prescriptions  on File Prior to Visit  Medication Sig Dispense Refill  . amLODipine (NORVASC) 5 MG tablet Take 1 tablet (5 mg total) by mouth daily. 90 tablet 2  . aspirin 81 MG tablet Take 81 mg by mouth daily.      Marland Kitchen CALCIUM-VITAMIN D PO Take by mouth.      . Diphenhydramine-APAP, sleep, (TYLENOL PM EXTRA STRENGTH PO) Take by mouth.      . hydrochlorothiazide (HYDRODIURIL) 25 MG tablet TAKE 1 TABLET BY MOUTH ONCE A DAY 90 tablet 1  . levothyroxine (SYNTHROID, LEVOTHROID) 100 MCG tablet TAKE 1 TABLET BY MOUTH ONCE A DAY 90 tablet 1  . metoprolol (LOPRESSOR) 50 MG tablet TAKE 1 TABLET BY MOUTH TWICE A DAY 180 tablet 1  . Multiple Vitamin (MULTI-VITAMIN PO) Take by mouth.      . ranitidine (ZANTAC) 150 MG tablet TAKE 1 TABLET BY MOUTH ONCE A DAY 90 tablet 1  . rosuvastatin (CRESTOR) 5 MG tablet Take 1 tablet (5 mg total) by mouth every other day. 90 tablet 0  . citalopram (CELEXA) 10 MG tablet Take 1 tablet (10 mg total) by mouth daily. (Patient not taking: Reported on 04/23/2016) 30 tablet 1   No current facility-administered medications on file prior to visit.     BP 132/82   Pulse 90   Temp 98.2 F (36.8 C) (Oral)   Ht 5\' 5"  (1.651 m)   Wt 155 lb 12.8 oz (70.7 kg)   SpO2 97%   BMI 25.93 kg/m    Objective:   Physical Exam  Constitutional: She appears well-nourished.  Cardiovascular: Normal rate and regular rhythm.   Pulmonary/Chest: Effort normal and breath sounds normal.  Abdominal: Soft. Bowel sounds are normal. There is no tenderness. There is no CVA tenderness.  Skin: Skin is warm and dry.  Psychiatric: She has a normal mood and affect.          Assessment & Plan:

## 2016-04-23 NOTE — Assessment & Plan Note (Signed)
5 UTI's in 2017, now with 2 in 2018. UA today with 3+ leuks, positive nitrites, 2+ blood. Culture sent. Rx for Cipro course sent to pharmacy. Referral for Urology placed for further evaluation. Push fluids, follow up PRN.

## 2016-04-25 LAB — URINE CULTURE

## 2016-05-10 DIAGNOSIS — Z1231 Encounter for screening mammogram for malignant neoplasm of breast: Secondary | ICD-10-CM | POA: Diagnosis not present

## 2016-05-10 DIAGNOSIS — Z853 Personal history of malignant neoplasm of breast: Secondary | ICD-10-CM | POA: Diagnosis not present

## 2016-05-11 ENCOUNTER — Encounter: Payer: Self-pay | Admitting: Primary Care

## 2016-05-29 DIAGNOSIS — R8271 Bacteriuria: Secondary | ICD-10-CM | POA: Diagnosis not present

## 2016-05-29 DIAGNOSIS — R350 Frequency of micturition: Secondary | ICD-10-CM | POA: Diagnosis not present

## 2016-05-29 DIAGNOSIS — M545 Low back pain: Secondary | ICD-10-CM | POA: Diagnosis not present

## 2016-05-30 ENCOUNTER — Other Ambulatory Visit: Payer: Self-pay | Admitting: Primary Care

## 2016-06-06 ENCOUNTER — Other Ambulatory Visit: Payer: Self-pay | Admitting: Primary Care

## 2016-06-09 ENCOUNTER — Encounter (HOSPITAL_COMMUNITY): Payer: Self-pay

## 2016-07-03 ENCOUNTER — Other Ambulatory Visit: Payer: Self-pay | Admitting: Primary Care

## 2016-07-23 ENCOUNTER — Other Ambulatory Visit: Payer: Self-pay | Admitting: Primary Care

## 2016-07-23 DIAGNOSIS — F411 Generalized anxiety disorder: Secondary | ICD-10-CM

## 2016-07-23 NOTE — Telephone Encounter (Signed)
Ok to refill? Electronically refill request for ALPRAZolam (XANAX) 0.5 MG tablet. Last prescribed and seen on 04/23/2016.

## 2016-07-24 NOTE — Telephone Encounter (Signed)
Rx called to pharmacy as instructed. 

## 2016-09-05 DIAGNOSIS — R8271 Bacteriuria: Secondary | ICD-10-CM | POA: Diagnosis not present

## 2016-09-05 DIAGNOSIS — N390 Urinary tract infection, site not specified: Secondary | ICD-10-CM | POA: Diagnosis not present

## 2016-10-18 ENCOUNTER — Other Ambulatory Visit: Payer: Self-pay | Admitting: Primary Care

## 2016-10-18 DIAGNOSIS — F411 Generalized anxiety disorder: Secondary | ICD-10-CM

## 2016-10-18 NOTE — Telephone Encounter (Signed)
Ok to refill? Electronically refill request for ALPRAZolam (XANAX) 0.5 MG tablet.  Last prescribed on 07/23/2016. Last seen on 04/23/2016

## 2016-10-18 NOTE — Telephone Encounter (Signed)
Okay to refill, please call in to pharmacy as noted on chart. #90, no refills.

## 2016-10-18 NOTE — Telephone Encounter (Signed)
Medication phoned to pharmacy.  

## 2016-11-20 ENCOUNTER — Other Ambulatory Visit: Payer: Self-pay | Admitting: Primary Care

## 2016-11-20 DIAGNOSIS — Z862 Personal history of diseases of the blood and blood-forming organs and certain disorders involving the immune mechanism: Secondary | ICD-10-CM

## 2016-11-20 DIAGNOSIS — I10 Essential (primary) hypertension: Secondary | ICD-10-CM

## 2016-11-20 DIAGNOSIS — E039 Hypothyroidism, unspecified: Secondary | ICD-10-CM

## 2016-11-21 ENCOUNTER — Encounter (INDEPENDENT_AMBULATORY_CARE_PROVIDER_SITE_OTHER): Payer: Self-pay

## 2016-11-21 ENCOUNTER — Ambulatory Visit (INDEPENDENT_AMBULATORY_CARE_PROVIDER_SITE_OTHER): Payer: Medicare Other

## 2016-11-21 VITALS — BP 160/92 | HR 82 | Temp 98.0°F | Ht 64.0 in | Wt 159.5 lb

## 2016-11-21 DIAGNOSIS — I10 Essential (primary) hypertension: Secondary | ICD-10-CM

## 2016-11-21 DIAGNOSIS — Z862 Personal history of diseases of the blood and blood-forming organs and certain disorders involving the immune mechanism: Secondary | ICD-10-CM | POA: Diagnosis not present

## 2016-11-21 DIAGNOSIS — Z0000 Encounter for general adult medical examination without abnormal findings: Secondary | ICD-10-CM

## 2016-11-21 DIAGNOSIS — E039 Hypothyroidism, unspecified: Secondary | ICD-10-CM | POA: Diagnosis not present

## 2016-11-21 NOTE — Progress Notes (Signed)
I reviewed health advisor's note, was available for consultation, and agree with documentation and plan.  

## 2016-11-21 NOTE — Patient Instructions (Signed)
Ms. Bartley , Thank you for taking time to come for your Medicare Wellness Visit. I appreciate your ongoing commitment to your health goals. Please review the following plan we discussed and let me know if I can assist you in the future.   These are the goals we discussed: Goals    . Increase water intake          Starting 11/21/2016, I will continue to drink at least 6-8 glasses of water daily.        This is a list of the screening recommended for you and due dates:  Health Maintenance  Topic Date Due  . Flu Shot  06/16/2017*  . Pap Smear  09/27/2048*  . Tetanus Vaccine  07/02/2020  . DEXA scan (bone density measurement)  Completed  . Pneumonia vaccines  Completed  *Topic was postponed. The date shown is not the original due date.   Preventive Care for Adults  A healthy lifestyle and preventive care can promote health and wellness. Preventive health guidelines for adults include the following key practices.  . A routine yearly physical is a good way to check with your health care provider about your health and preventive screening. It is a chance to share any concerns and updates on your health and to receive a thorough exam.  . Visit your dentist for a routine exam and preventive care every 6 months. Brush your teeth twice a day and floss once a day. Good oral hygiene prevents tooth decay and gum disease.  . The frequency of eye exams is based on your age, health, family medical history, use  of contact lenses, and other factors. Follow your health care provider's ecommendations for frequency of eye exams.  . Eat a healthy diet. Foods like vegetables, fruits, whole grains, low-fat dairy products, and lean protein foods contain the nutrients you need without too many calories. Decrease your intake of foods high in solid fats, added sugars, and salt. Eat the right amount of calories for you. Get information about a proper diet from your health care provider, if necessary.  . Regular  physical exercise is one of the most important things you can do for your health. Most adults should get at least 150 minutes of moderate-intensity exercise (any activity that increases your heart rate and causes you to sweat) each week. In addition, most adults need muscle-strengthening exercises on 2 or more days a week.  Silver Sneakers may be a benefit available to you. To determine eligibility, you may visit the website: www.silversneakers.com or contact program at (925) 858-8697 Mon-Fri between 8AM-8PM.   . Maintain a healthy weight. The body mass index (BMI) is a screening tool to identify possible weight problems. It provides an estimate of body fat based on height and weight. Your health care provider can find your BMI and can help you achieve or maintain a healthy weight.   For adults 20 years and older: ? A BMI below 18.5 is considered underweight. ? A BMI of 18.5 to 24.9 is normal. ? A BMI of 25 to 29.9 is considered overweight. ? A BMI of 30 and above is considered obese.   . Maintain normal blood lipids and cholesterol levels by exercising and minimizing your intake of saturated fat. Eat a balanced diet with plenty of fruit and vegetables. Blood tests for lipids and cholesterol should begin at age 58 and be repeated every 5 years. If your lipid or cholesterol levels are high, you are over 50, or you are at  high risk for heart disease, you may need your cholesterol levels checked more frequently. Ongoing high lipid and cholesterol levels should be treated with medicines if diet and exercise are not working.  . If you smoke, find out from your health care provider how to quit. If you do not use tobacco, please do not start.  . If you choose to drink alcohol, please do not consume more than 2 drinks per day. One drink is considered to be 12 ounces (355 mL) of beer, 5 ounces (148 mL) of wine, or 1.5 ounces (44 mL) of liquor.  . If you are 31-44 years old, ask your health care provider if  you should take aspirin to prevent strokes.  . Use sunscreen. Apply sunscreen liberally and repeatedly throughout the day. You should seek shade when your shadow is shorter than you. Protect yourself by wearing long sleeves, pants, a wide-brimmed hat, and sunglasses year round, whenever you are outdoors.  . Once a month, do a whole body skin exam, using a mirror to look at the skin on your back. Tell your health care provider of new moles, moles that have irregular borders, moles that are larger than a pencil eraser, or moles that have changed in shape or color.

## 2016-11-21 NOTE — Progress Notes (Signed)
Subjective:   Ashlee Lopez is a 83 y.o. female who presents for Medicare Annual (Subsequent) preventive examination.  Review of Systems:  N/A Cardiac Risk Factors include: advanced age (>38men, >66 women);dyslipidemia;hypertension     Objective:     Vitals: BP (!) 160/92 (BP Location: Right Arm, Patient Position: Sitting, Cuff Size: Normal) Comment: Medications not taken  Pulse 82   Temp 98 F (36.7 C) (Oral)   Ht 5\' 4"  (1.626 m) Comment: no shoes  Wt 159 lb 8 oz (72.3 kg)   SpO2 96%   BMI 27.38 kg/m   Body mass index is 27.38 kg/m.   Tobacco History  Smoking Status  . Never Smoker  Smokeless Tobacco  . Never Used     Counseling given: No   Past Medical History:  Diagnosis Date  . Benign essential HTN 03/21/2011  . Breast cancer, stage 1 ([place]) 02/10/2003   Right tubular breast cancer  . Cancer ([place])   . Diverticulosis of colon 03/23/2011  . Family history of breast cancer   . Family history of colon cancer   . Family history of melanoma   . Family history of prostate cancer   . Fibrocystic disease of breast 03/23/2011  . Graves' disease with exophthalmos 03/23/2011  . Hypertension   . Hypothyroid 03/21/2011  . IBS (irritable bowel syndrome) 03/23/2011  . ITP (idiopathic thrombocytopenic purpura) 03/21/2011  . S/P splenectomy 03/21/2011  . Uterus cancer ([place]) 03/23/1999   Well differentiated AdenoCA of endometrium-superficially confined  . Varicose vein of leg 03/23/2011   Past Surgical History:  Procedure Laterality Date  . ABDOMINAL HYSTERECTOMY  2001  . APPENDECTOMY  1941  . BREAST SURGERY  02/17/2003   Mastectomy-Right  . MASTECTOMY  2004   Dr Margot Chimes  . OOPHORECTOMY     BSO  . SPLENECTOMY  1986   Family History  Problem Relation Age of Onset  . Breast cancer Mother        Age 41  . Hypertension Father   . Heart disease Father   . Lung cancer Father   . Breast cancer Sister        Age 85  . Melanoma Sister        dx in her 68s  . Dementia Sister   .  Heart disease Maternal Aunt   . Prostate cancer Maternal Uncle   . Colon cancer Maternal Grandfather   . Colon cancer Maternal Uncle   . Prostate cancer Maternal Uncle   . Dementia Maternal Aunt   . Breast cancer Cousin        maternal 2nd cousin  . Breast cancer Cousin        maternal first cousin dx in her 52s  . Melanoma Son        dx in his 34s   History  Sexual Activity  . Sexual activity: No    Outpatient Encounter Prescriptions as of 11/21/2016  Medication Sig  . ALPRAZolam (XANAX) 0.5 MG tablet TAKE 1 TABLET BY MOUTH ONCE DAILY AS NEEDED FOR ANXIETY  . amLODipine (NORVASC) 5 MG tablet TAKE 1 TABLET BY MOUTH ONCE A DAY  . aspirin 81 MG tablet Take 81 mg by mouth daily.    Marland Kitchen CALCIUM-VITAMIN D PO Take by mouth.    . CRANBERRY PO Take 3 tablets by mouth daily.  . Diphenhydramine-APAP, sleep, (TYLENOL PM EXTRA STRENGTH PO) Take by mouth.    . hydrochlorothiazide (HYDRODIURIL) 25 MG tablet TAKE 1 TABLET BY MOUTH ONCE DAILY  .  levothyroxine (SYNTHROID, LEVOTHROID) 100 MCG tablet TAKE 1 TABLET BY MOUTH ONCE DAILY  . metoprolol (LOPRESSOR) 50 MG tablet TAKE 1 TABLET BY MOUTH TWICE (2) DAILY  . Multiple Vitamin (MULTI-VITAMIN PO) Take by mouth.    . ranitidine (ZANTAC) 150 MG tablet TAKE 1 TABLET BY MOUTH ONCE DAILY  . rosuvastatin (CRESTOR) 5 MG tablet TAKE 1 TABLET BY MOUTH EVERY OTHER DAY  . [DISCONTINUED] ciprofloxacin (CIPRO) 250 MG tablet Take 1 tablet (250 mg total) by mouth 2 (two) times daily.   No facility-administered encounter medications on file as of 11/21/2016.     Activities of Daily Living In your present state of health, do you have any difficulty performing the following activities: 11/21/2016  Hearing? Y  Vision? N  Difficulty concentrating or making decisions? N  Walking or climbing stairs? Y  Dressing or bathing? N  Doing errands, shopping? N  Preparing Food and eating ? N  Using the Toilet? N  In the past six months, have you accidently leaked urine? Y    Do you have problems with loss of bowel control? Y  Managing your Medications? N  Managing your Finances? N  Housekeeping or managing your Housekeeping? N  Some recent data might be hidden    Patient Care Team: Pleas Koch, NP as PCP - General (Internal Medicine) Annia Belt, MD (Hematology and Oncology) Eula Flax, OD as Referring Physician (Optometry) Christene Slates, MD as Consulting Physician (Dermatology)    Assessment:     Hearing Screening   125Hz  250Hz  500Hz  1000Hz  2000Hz  3000Hz  4000Hz  6000Hz  8000Hz   Right ear:   40 40 40  0    Left ear:   0 40 0  0    Vision Screening Comments: Last vision exam in Oct 2017   Exercise Activities and Dietary recommendations Current Exercise Habits: The patient does not participate in regular exercise at present, Exercise limited by: None identified  Goals    . Increase water intake          Starting 11/21/2016, I will continue to drink at least 6-8 glasses of water daily.       Fall Risk Fall Risk  11/21/2016 09/28/2015  Falls in the past year? Yes Yes  Comment - fell while waking down a step at a restaurant  Number falls in past yr: 2 or more 1  Injury with Fall? Yes Yes  Follow up - Falls evaluation completed   Depression Screen PHQ 2/9 Scores 11/21/2016 09/28/2015  PHQ - 2 Score 2 0  PHQ- 9 Score 4 -     Cognitive Function MMSE - Mini Mental State Exam 11/21/2016 09/28/2015  Orientation to time 5 5  Orientation to Place 5 5  Registration 3 3  Attention/ Calculation 0 0  Recall 3 3  Language- name 2 objects 0 0  Language- repeat 1 1  Language- follow 3 step command 3 3  Language- read & follow direction 0 0  Write a sentence 0 0  Copy design 0 0  Total score 20 20       PLEASE NOTE: A Mini-Cog screen was completed. Maximum score is 20. A value of 0 denotes this part of Folstein MMSE was not completed or the patient failed this part of the Mini-Cog screening.   Mini-Cog Screening Orientation to Time -  Max 5 pts Orientation to Place - Max 5 pts Registration - Max 3 pts Recall - Max 3 pts Language Repeat - Max 1 pts Language  Follow 3 Step Command - Max 3 pts   Immunization History  Administered Date(s) Administered  . Influenza-Unspecified 12/19/2015  . Pneumococcal Conjugate-13 09/28/2015  . Pneumococcal Polysaccharide-23 03/23/2011   Screening Tests Health Maintenance  Topic Date Due  . INFLUENZA VACCINE  06/16/2017 (Originally 10/17/2016)  . PAP SMEAR  09/27/2048 (Originally 07/10/2012)  . TETANUS/TDAP  07/02/2020  . DEXA SCAN  Completed  . PNA vac Low Risk Adult  Completed      Plan:     I have personally reviewed and addressed the Medicare Annual Wellness questionnaire and have noted the following in the patient's chart:  A. Medical and social history B. Use of alcohol, tobacco or illicit drugs  C. Current medications and supplements D. Functional ability and status E.  Nutritional status F.  Physical activity G. Advance directives H. List of other physicians I.  Hospitalizations, surgeries, and ER visits in previous 12 months J.  [place] to include hearing, vision, cognitive, depression L. Referrals and appointments - none  In addition, I have reviewed and discussed with patient certain preventive protocols, quality metrics, and best practice recommendations. A written personalized care plan for preventive services as well as general preventive health recommendations were provided to patient.  See attached scanned questionnaire for additional information.   Signed,   Lindell Noe, MHA, BS, LPN Health Coach

## 2016-11-21 NOTE — Progress Notes (Signed)
Pre visit review using our clinic review tool, if applicable. No additional management support is needed unless otherwise documented below in the visit note. 

## 2016-11-21 NOTE — Progress Notes (Signed)
PCP notes:   Health maintenance:  Flu vaccine - addressed  Abnormal screenings:   Hearing - failed Fall risk - hx of multiple falls with injury Depression score: 4  Patient concerns:   A. Intermittent pain in right ear B. Legs swelling at night C. Left calf soreness  Nurse concerns:  Patient's BP was elevated. PCP notified. Patient instructed to take and record BP readings daily until next appt. Patient verbalized understanding.   Next PCP appt:   12/05/16 @ 0815

## 2016-11-22 LAB — COMPREHENSIVE METABOLIC PANEL
A/G RATIO: 1.6 (ref 1.2–2.2)
ALBUMIN: 4.8 g/dL — AB (ref 3.5–4.7)
ALT: 20 IU/L (ref 0–32)
AST: 33 IU/L (ref 0–40)
Alkaline Phosphatase: 72 IU/L (ref 39–117)
BILIRUBIN TOTAL: 0.6 mg/dL (ref 0.0–1.2)
BUN / CREAT RATIO: 17 (ref 12–28)
BUN: 10 mg/dL (ref 8–27)
CHLORIDE: 91 mmol/L — AB (ref 96–106)
CO2: 26 mmol/L (ref 20–29)
Calcium: 10 mg/dL (ref 8.7–10.3)
Creatinine, Ser: 0.59 mg/dL (ref 0.57–1.00)
GFR calc non Af Amer: 86 mL/min/{1.73_m2} (ref >59)
GFR, EST AFRICAN AMERICAN: 99 mL/min/{1.73_m2} (ref >59)
Globulin, Total: 3 g/dL (ref 1.5–4.5)
Glucose: 108 mg/dL — ABNORMAL HIGH (ref 65–99)
Potassium: 4.4 mmol/L (ref 3.5–5.2)
Sodium: 134 mmol/L (ref 134–144)
TOTAL PROTEIN: 7.8 g/dL (ref 6.0–8.5)

## 2016-11-22 LAB — CBC WITH DIFFERENTIAL/PLATELET
Basophils Absolute: 0.1 10*3/uL (ref 0.0–0.2)
Basos: 1 %
EOS (ABSOLUTE): 0.2 10*3/uL (ref 0.0–0.4)
Eos: 2 %
HEMATOCRIT: 39.2 % (ref 34.0–46.6)
HEMOGLOBIN: 13.8 g/dL (ref 11.1–15.9)
IMMATURE GRANS (ABS): 0 10*3/uL (ref 0.0–0.1)
Immature Granulocytes: 0 %
LYMPHS: 21 %
Lymphocytes Absolute: 1.4 10*3/uL (ref 0.7–3.1)
MCH: 31.7 pg (ref 26.6–33.0)
MCHC: 35.2 g/dL (ref 31.5–35.7)
MCV: 90 fL (ref 79–97)
MONOCYTES: 13 %
Monocytes Absolute: 0.8 10*3/uL (ref 0.1–0.9)
NEUTROS ABS: 4.1 10*3/uL (ref 1.4–7.0)
Neutrophils: 63 %
Platelets: 323 10*3/uL (ref 150–379)
RBC: 4.35 x10E6/uL (ref 3.77–5.28)
RDW: 13.4 % (ref 12.3–15.4)
WBC: 6.5 10*3/uL (ref 3.4–10.8)

## 2016-11-22 LAB — LIPID PANEL
CHOLESTEROL TOTAL: 176 mg/dL (ref 100–199)
Chol/HDL Ratio: 3 ratio (ref 0.0–4.4)
HDL: 58 mg/dL (ref >39)
LDL Calculated: 91 mg/dL (ref 0–99)
TRIGLYCERIDES: 136 mg/dL (ref 0–149)
VLDL CHOLESTEROL CAL: 27 mg/dL (ref 5–40)

## 2016-11-22 LAB — TSH: TSH: 2.53 u[IU]/mL (ref 0.450–4.500)

## 2016-12-05 ENCOUNTER — Ambulatory Visit (INDEPENDENT_AMBULATORY_CARE_PROVIDER_SITE_OTHER): Payer: Medicare Other | Admitting: Primary Care

## 2016-12-05 ENCOUNTER — Encounter: Payer: Self-pay | Admitting: Primary Care

## 2016-12-05 VITALS — BP 148/82 | HR 71 | Temp 98.4°F | Ht 64.0 in | Wt 159.4 lb

## 2016-12-05 DIAGNOSIS — D693 Immune thrombocytopenic purpura: Secondary | ICD-10-CM | POA: Diagnosis not present

## 2016-12-05 DIAGNOSIS — E785 Hyperlipidemia, unspecified: Secondary | ICD-10-CM

## 2016-12-05 DIAGNOSIS — Z23 Encounter for immunization: Secondary | ICD-10-CM | POA: Diagnosis not present

## 2016-12-05 DIAGNOSIS — I10 Essential (primary) hypertension: Secondary | ICD-10-CM | POA: Diagnosis not present

## 2016-12-05 DIAGNOSIS — E039 Hypothyroidism, unspecified: Secondary | ICD-10-CM | POA: Diagnosis not present

## 2016-12-05 DIAGNOSIS — N390 Urinary tract infection, site not specified: Secondary | ICD-10-CM | POA: Diagnosis not present

## 2016-12-05 DIAGNOSIS — F411 Generalized anxiety disorder: Secondary | ICD-10-CM | POA: Diagnosis not present

## 2016-12-05 NOTE — Assessment & Plan Note (Signed)
Mammogram UTD. Breast exam today stable.

## 2016-12-05 NOTE — Patient Instructions (Addendum)
Start exercising. You should be getting 150 minutes of exercise weekly.  Ensure you are consuming 64 ounces of water daily.  It's important to improve your diet by reducing consumption of fast food, fried food, processed snack foods, sugary drinks. Increase consumption of fresh vegetables and fruits, whole grains, water.  Ensure you are drinking 64 ounces of water daily.  Follow up in 6 months for re-evaluation or sooner if needed. It was a pleasure to see you today!

## 2016-12-05 NOTE — Progress Notes (Signed)
Subjective:    Patient ID: Ashlee Lopez, female    DOB: 08-31-1933, 83 y.o.   MRN: [id number]  HPI  Ms. Vandemark is an 83 year old female who presents today for [place] Part 2. She saw our health advisor last week.   Immunizations: -Tetanus: Will check with insurance. -Influenza: Due today. -Pneumonia: Completed Pneumovax in 2013, completed Prevnar 13 in 2017. -Shingles: Never completed.  Colonoscopy: Completed in 2014 Mammogram: Completed in 2018 Dexa: Completed in 2017, osteopenia.   1) Hypothyroidism: Currently managed on levothyroxine 100 mcg. Recent TSH of 2.5. Compliant to her medications.   2) Essential Hypertension: Currently managed on HCTZ 25 mg, metoprolol tartrate 50 mg BID, and amlodipine 5 mg. Her BP in the office today is 148/82. She's been checking her BP at home and is getting readings of 140's-160's/80's-90's, mostly 140's/80's. She denies chest pain, dizziness, visual changes. Intermittent lower extremity edema that progresses during the day, improves at night.   3) Hyperlipidemia: Currently managed on rosuvastatin 5 mg, recent lipid panel stable.   4) GAD: Currently managed on Alprazolam 0.5 mg for which she's taking once daily on average, mostly at 2 pm. She's under a lot of family stress with her sister who has advanced Alzheimer's Disease. She's trying to reduce the amount she's taking.  Review of Systems  Eyes: Negative for visual disturbance.  Respiratory: Negative for shortness of breath.   Cardiovascular: Positive for leg swelling. Negative for chest pain.  Musculoskeletal: Positive for arthralgias.  Neurological: Negative for dizziness.  Psychiatric/Behavioral:       Increased stress with family       Past Medical History:  Diagnosis Date  . Benign essential HTN 03/21/2011  . Breast cancer, stage 1 ([place]) 02/10/2003   Right tubular breast cancer  . Cancer ([place])   . Diverticulosis of colon 03/23/2011  . Family history of breast cancer   . Family history  of colon cancer   . Family history of melanoma   . Family history of prostate cancer   . Fibrocystic disease of breast 03/23/2011  . Graves' disease with exophthalmos 03/23/2011  . Hypertension   . Hypothyroid 03/21/2011  . IBS (irritable bowel syndrome) 03/23/2011  . ITP (idiopathic thrombocytopenic purpura) 03/21/2011  . S/P splenectomy 03/21/2011  . 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 Varicose vein of leg 03/23/2011     Social History   Social History  . Marital status: Widowed    Spouse name: N/A  . Number of children: N/A  . Years of education: N/A   Occupational History  . Not on file.   Social History Main Topics  . Smoking status: Never Smoker  . Smokeless tobacco: Never Used  . Alcohol use No  . Drug use: No  . Sexual activity: No   Other Topics Concern  . Not on file   Social History Narrative   Widow. Lives alone.    3 children, 5 grandchildren.   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   Enjoys reading, watching TV.    Past Surgical History:  Procedure Laterality Date  . ABDOMINAL HYSTERECTOMY  2001  . APPENDECTOMY  1941  . BREAST SURGERY  02/17/2003   Mastectomy-Right  . MASTECTOMY  2004   Dr Margot Chimes  . OOPHORECTOMY     BSO  . SPLENECTOMY  1986    Family History  Problem Relation Age of Onset  . Breast cancer Mother  Age 64  . Hypertension Father   . Heart disease Father   . Lung cancer Father   . Breast cancer Sister        Age 41  . Melanoma Sister        dx in her 66s  . Dementia Sister   . Heart disease Maternal Aunt   . Prostate cancer Maternal Uncle   . Colon cancer Maternal Grandfather   . Colon cancer Maternal Uncle   . Prostate cancer Maternal Uncle   . Dementia Maternal Aunt   . Breast cancer Cousin        maternal 2nd cousin  . Breast cancer Cousin        maternal first cousin dx in her 31s  . Melanoma Son        dx in his 21s    Allergies  Allergen Reactions    . Sulfamethoxazole-Trimethoprim Hives  . Septra [Bactrim]     Current Outpatient Prescriptions on File Prior to Visit  Medication Sig Dispense Refill  . ALPRAZolam (XANAX) 0.5 MG tablet TAKE 1 TABLET BY MOUTH ONCE DAILY AS NEEDED FOR ANXIETY 90 tablet 0  . amLODipine (NORVASC) 5 MG tablet TAKE 1 TABLET BY MOUTH ONCE A DAY 90 tablet 1  . aspirin 81 MG tablet Take 81 mg by mouth daily.      Marland Kitchen CALCIUM-VITAMIN D PO Take by mouth.      . CRANBERRY PO Take 3 tablets by mouth daily.    . Diphenhydramine-APAP, sleep, (TYLENOL PM EXTRA STRENGTH PO) Take by mouth.      . hydrochlorothiazide (HYDRODIURIL) 25 MG tablet TAKE 1 TABLET BY MOUTH ONCE DAILY 90 tablet 1  . levothyroxine (SYNTHROID, LEVOTHROID) 100 MCG tablet TAKE 1 TABLET BY MOUTH ONCE DAILY 90 tablet 1  . metoprolol (LOPRESSOR) 50 MG tablet TAKE 1 TABLET BY MOUTH TWICE (2) DAILY 180 tablet 1  . Multiple Vitamin (MULTI-VITAMIN PO) Take by mouth.      . ranitidine (ZANTAC) 150 MG tablet TAKE 1 TABLET BY MOUTH ONCE DAILY 90 tablet 1  . rosuvastatin (CRESTOR) 5 MG tablet TAKE 1 TABLET BY MOUTH EVERY OTHER DAY 90 tablet 0   No current facility-administered medications on file prior to visit.     BP (!) 148/82   Pulse 71   Temp 98.4 F (36.9 C) (Oral)   Ht 5\' 4"  (1.626 m)   Wt 159 lb 6.4 oz (72.3 kg)   SpO2 97%   BMI 27.36 kg/m    Objective:   Physical Exam  Constitutional: She appears well-nourished.  Neck: Neck supple.  Cardiovascular: Normal rate and regular rhythm.   Pulmonary/Chest: Effort normal and breath sounds normal.    Dense tissue noted to picture below. Absence of right breast.  Skin: Skin is warm and dry.  Psychiatric: She has a normal mood and affect.          Assessment & Plan:

## 2016-12-05 NOTE — Assessment & Plan Note (Signed)
Very much evident, she continues to refuse treatment with SSRI. Is working on reducing alprazolam use by taking once daily. Has been on this for 20+ years, attempt to wean off was unsuccessful. Continue to monitor.

## 2016-12-05 NOTE — Addendum Note (Signed)
Addended by: Jacqualin Combes on: 12/05/2016 03:11 PM   Modules accepted: Orders

## 2016-12-05 NOTE — Assessment & Plan Note (Signed)
Recent TSH unremarkable. Continue levothyroxine 100 mcg.

## 2016-12-05 NOTE — Assessment & Plan Note (Signed)
Following with Urology. 

## 2016-12-05 NOTE — Assessment & Plan Note (Signed)
Recent lipid panel stable, continue Crestor.  

## 2016-12-05 NOTE — Assessment & Plan Note (Signed)
Recent CBC unremarkable, continue to monitor.

## 2016-12-05 NOTE — Assessment & Plan Note (Addendum)
Home readings about 140/80's, historically runs higher in offices. Will have her continue to monitor at home, report readings at or above 150/90. BMP unremarkable. Discussed to elevate legs at rest, purchase compression socks/hose.

## 2016-12-11 ENCOUNTER — Other Ambulatory Visit: Payer: Self-pay | Admitting: Primary Care

## 2017-01-07 DIAGNOSIS — N3000 Acute cystitis without hematuria: Secondary | ICD-10-CM | POA: Diagnosis not present

## 2017-01-07 DIAGNOSIS — R300 Dysuria: Secondary | ICD-10-CM | POA: Diagnosis not present

## 2017-01-09 DIAGNOSIS — H2513 Age-related nuclear cataract, bilateral: Secondary | ICD-10-CM | POA: Diagnosis not present

## 2017-01-16 ENCOUNTER — Other Ambulatory Visit: Payer: Self-pay | Admitting: Primary Care

## 2017-01-16 DIAGNOSIS — F411 Generalized anxiety disorder: Secondary | ICD-10-CM

## 2017-01-16 NOTE — Telephone Encounter (Signed)
Reviewed Ashlee Lopez's note from 11/2016. Ok to phone in Xanax. Consider changing quantity to 60 but will defer this to PCP at next refill.

## 2017-01-16 NOTE — Telephone Encounter (Signed)
Ok to refill? Electronically refill request for ALPRAZolam Duanne Moron) 0.5 MG tablet  Last prescribed on 10/18/2016. Last seen on 12/05/2016

## 2017-01-16 NOTE — Telephone Encounter (Signed)
Called in medication to the pharmacy as instructed. 

## 2017-02-18 ENCOUNTER — Other Ambulatory Visit: Payer: Self-pay | Admitting: Primary Care

## 2017-02-21 ENCOUNTER — Ambulatory Visit: Payer: Self-pay | Admitting: Urology

## 2017-02-22 ENCOUNTER — Ambulatory Visit (INDEPENDENT_AMBULATORY_CARE_PROVIDER_SITE_OTHER): Payer: Medicare Other | Admitting: Urology

## 2017-02-22 ENCOUNTER — Encounter: Payer: Self-pay | Admitting: Urology

## 2017-02-22 VITALS — BP 156/80 | HR 85 | Ht 64.0 in | Wt 156.9 lb

## 2017-02-22 DIAGNOSIS — R300 Dysuria: Secondary | ICD-10-CM

## 2017-02-22 DIAGNOSIS — N390 Urinary tract infection, site not specified: Secondary | ICD-10-CM | POA: Diagnosis not present

## 2017-02-22 LAB — URINALYSIS, COMPLETE
BILIRUBIN UA: NEGATIVE
GLUCOSE, UA: NEGATIVE
Ketones, UA: NEGATIVE
Nitrite, UA: POSITIVE — AB
PH UA: 7.5 (ref 5.0–7.5)
Specific Gravity, UA: 1.015 (ref 1.005–1.030)
Urobilinogen, Ur: 0.2 mg/dL (ref 0.2–1.0)

## 2017-02-22 LAB — MICROSCOPIC EXAMINATION

## 2017-02-22 MED ORDER — CEFUROXIME AXETIL 500 MG PO TABS: 500.0000 mg | ORAL_TABLET | Freq: Two times a day (BID) | ORAL | 0 refills | Status: AC

## 2017-02-22 NOTE — Progress Notes (Signed)
02/22/2017 2:30 PM   Ashlee Lopez 01-11-1934 [id number]  Referring provider: Pleas Koch, NP [place], [place] [address]  Chief Complaint  Patient presents with  . Dysuria    HPI: Ashlee Lopez is a 83 year old female who presents for evaluation of a urinary tract infection.  She has been seen at [facility] for the past year for recurrent UTIs.  Her records were not available for review however she states she had a negative renal ultrasound and was started on cranberry supplements.  She remained asymptomatic however was treated for a UTI in October 2018 and presents today with a 2-day history of urinary frequency, urgency and dysuria.  She took Azo yesterday and her dysuria has resolved.  She denies fever, chills or gross hematuria.   PMH: Past Medical History:  Diagnosis Date  . Benign essential HTN 03/21/2011  . Breast cancer, stage 1 ([place]) 02/10/2003   Right tubular breast cancer  . Cancer ([place])   . Diverticulosis of colon 03/23/2011  . Family history of breast cancer   . Family history of colon cancer   . Family history of melanoma   . Family history of prostate cancer   . Fibrocystic disease of breast 03/23/2011  . Graves' disease with exophthalmos 03/23/2011  . Hypertension   . Hypothyroid 03/21/2011  . IBS (irritable bowel syndrome) 03/23/2011  . ITP (idiopathic thrombocytopenic purpura) 03/21/2011  . S/P splenectomy 03/21/2011  . Uterus cancer ([place]) 03/23/1999   Well differentiated AdenoCA of endometrium-superficially confined  . Varicose vein of leg 03/23/2011    Surgical History: Past Surgical History:  Procedure Laterality Date  . ABDOMINAL HYSTERECTOMY  2001  . APPENDECTOMY  1941  . BREAST SURGERY  02/17/2003   Mastectomy-Right  . MASTECTOMY  2004   Dr Margot Chimes  . OOPHORECTOMY     BSO  . SPLENECTOMY  1986    Home Medications:  Allergies as of 02/22/2017      Reactions   Sulfamethoxazole-trimethoprim Hives   Septra [bactrim]       Medication List       Accurate as of 02/22/17  2:30 PM. Always use your most recent med list.          ALPRAZolam 0.5 MG tablet Commonly known as:  XANAX TAKE 1 TABLET BY MOUTH DAILY AS NEEDED FOR ANXIETY   amLODipine 5 MG tablet Commonly known as:  NORVASC TAKE 1 TABLET BY MOUTH ONCE A DAY   aspirin 81 MG tablet Take 81 mg by mouth daily.   CALCIUM-VITAMIN D PO Take by mouth.   CRANBERRY PO Take 3 tablets by mouth daily.   hydrochlorothiazide 25 MG tablet Commonly known as:  HYDRODIURIL TAKE 1 TABLET BY MOUTH ONCE DAILY   levothyroxine 100 MCG tablet Commonly known as:  SYNTHROID, LEVOTHROID TAKE 1 TABLET BY MOUTH ONCE DAILY   metoprolol tartrate 50 MG tablet Commonly known as:  LOPRESSOR TAKE 1 TABLET BY MOUTH TWICE (2) DAILY   MULTI-VITAMIN PO Take by mouth.   ranitidine 150 MG tablet Commonly known as:  ZANTAC TAKE 1 TABLET BY MOUTH ONCE DAILY   rosuvastatin 5 MG tablet Commonly known as:  CRESTOR TAKE 1 TABLET BY MOUTH EVERY OTHER DAY   TYLENOL PM EXTRA STRENGTH PO Take by mouth.       Allergies:  Allergies  Allergen Reactions  . Sulfamethoxazole-Trimethoprim Hives  . Septra [Bactrim]     Family History: Family History  Problem Relation Age of Onset  . Breast  cancer Mother        Age 33  . Hypertension Father   . Heart disease Father   . Lung cancer Father   . Breast cancer Sister        Age 37  . Melanoma Sister        dx in her 53s  . Dementia Sister   . Heart disease Maternal Aunt   . Prostate cancer Maternal Uncle   . Colon cancer Maternal Grandfather   . Colon cancer Maternal Uncle   . Prostate cancer Maternal Uncle   . Dementia Maternal Aunt   . Breast cancer Cousin        maternal 2nd cousin  . Breast cancer Cousin        maternal first cousin dx in her 51s  . Melanoma Son        dx in his 62s    Social History:  reports that  has never smoked. she has never used smokeless tobacco. She reports that she does not drink alcohol or use  drugs.  ROS: UROLOGY Frequent Urination?: Yes Hard to postpone urination?: Yes Burning/pain with urination?: Yes Get up at night to urinate?: Yes Leakage of urine?: Yes Urine stream starts and stops?: No Trouble starting stream?: No Do you have to strain to urinate?: No Blood in urine?: No Urinary tract infection?: Yes Sexually transmitted disease?: No Injury to kidneys or bladder?: No Painful intercourse?: No Weak stream?: No Currently pregnant?: No Vaginal bleeding?: No Last menstrual period?: n  Gastrointestinal Nausea?: Yes Vomiting?: Yes Indigestion/heartburn?: No Diarrhea?: Yes Constipation?: No  Constitutional Fever: No Night sweats?: No Weight loss?: No Fatigue?: No  Skin Skin rash/lesions?: No Itching?: Yes  Eyes Blurred vision?: No Double vision?: No  Ears/Nose/Throat Sore throat?: No Sinus problems?: Yes  Hematologic/Lymphatic Swollen glands?: No Easy bruising?: Yes  Cardiovascular Leg swelling?: Yes Chest pain?: No  Respiratory Cough?: Yes Shortness of breath?: No  Endocrine Excessive thirst?: No  Musculoskeletal Back pain?: Yes Joint pain?: Yes  Neurological Headaches?: No Dizziness?: No  Psychologic Depression?: Yes Anxiety?: Yes  Physical Exam: BP (!) 156/80   Pulse 85   Ht 5\' 4"  (1.626 m)   Wt 156 lb 14.4 oz (71.2 kg)   BMI 26.93 kg/m   Constitutional:  Alert and oriented, No acute distress. HEENT: [place] AT, moist mucus membranes.  Trachea midline, no masses. Cardiovascular: No clubbing, cyanosis, or edema. Respiratory: Normal respiratory effort, no increased work of breathing. GI: Abdomen is soft, nontender, nondistended, no abdominal masses GU: No CVA tenderness.  Skin: No rashes, bruises or suspicious lesions. Lymph: No cervical or inguinal adenopathy. Neurologic: Grossly intact, no focal deficits, moving all 4 extremities. Psychiatric: Normal mood and affect.  Laboratory Data: Lab Results  Component Value  Date   WBC 6.5 11/21/2016   HGB 13.8 11/21/2016   HCT 39.2 11/21/2016   MCV 90 11/21/2016   PLT 323 11/21/2016    Lab Results  Component Value Date   CREATININE 0.59 11/21/2016    Urinalysis Dipstick: 1+ blood, nitrite positive, 3+ leukocytes Microscopy: 11-30 WBC, 3-10 RBC, many bacteria   Assessment & Plan:   Recurrent UTI with a symptomatic infection.  A urine culture was ordered.  Antibiotic Rx was sent to her pharmacy pending the culture result.  Her [facility] records were requested for review.  1. Dysuria  - Urinalysis, Complete - Urine Culture  2. Recurrent UTI    Abbie Sons, MD  [facility] [address]  [place], [place] [address] [phone]

## 2017-02-27 LAB — URINE CULTURE

## 2017-02-28 ENCOUNTER — Telehealth: Payer: Self-pay

## 2017-02-28 NOTE — Telephone Encounter (Signed)
Spoke with pt in reference to ucx results and abx. Pt voiced understanding. 

## 2017-02-28 NOTE — Telephone Encounter (Signed)
-----   Message from Abbie Sons, MD sent at 02/28/2017  8:52 AM EST ----- Urine cx was positive and sens to prescribed antibiotic

## 2017-03-04 ENCOUNTER — Ambulatory Visit (INDEPENDENT_AMBULATORY_CARE_PROVIDER_SITE_OTHER): Payer: Medicare Other

## 2017-03-04 ENCOUNTER — Telehealth: Payer: Self-pay | Admitting: Urology

## 2017-03-04 ENCOUNTER — Other Ambulatory Visit: Payer: Self-pay | Admitting: Urology

## 2017-03-04 VITALS — BP 177/96 | HR 84 | Ht 64.0 in | Wt 158.0 lb

## 2017-03-04 DIAGNOSIS — R300 Dysuria: Secondary | ICD-10-CM | POA: Diagnosis not present

## 2017-03-04 MED ORDER — AMOXICILLIN 875 MG PO TABS: 875.0000 mg | ORAL_TABLET | Freq: Two times a day (BID) | ORAL | 0 refills | Status: DC

## 2017-03-04 NOTE — Progress Notes (Signed)
Pt presents today with c/o urinary frequency and urgency, hard to postpone urination, and dysuria. Pt has been on abx in the last 30 days for a UTI. A clean catch was obtained for u/a and cx.   Blood pressure (!) 177/96, pulse 84, height 5\' 4"  (1.626 m), weight 158 lb (71.7 kg).

## 2017-03-04 NOTE — Telephone Encounter (Signed)
Patient finished her abx on the 14th and she is now having the same symptoms as before. She is burning with urination and wants to know if she can have another round of abx instead of having to come in again to be checked? Please advise   Sharyn Lull

## 2017-03-05 ENCOUNTER — Ambulatory Visit: Payer: Medicare Other

## 2017-03-05 LAB — MICROSCOPIC EXAMINATION

## 2017-03-05 LAB — URINALYSIS, COMPLETE
Bilirubin, UA: NEGATIVE
KETONES UA: NEGATIVE
Nitrite, UA: POSITIVE — AB
PH UA: 7 (ref 5.0–7.5)
SPEC GRAV UA: 1.01 (ref 1.005–1.030)
Urobilinogen, Ur: 1 mg/dL (ref 0.2–1.0)

## 2017-03-05 NOTE — Telephone Encounter (Signed)
Came in for repeat ua/cx

## 2017-03-07 LAB — CULTURE, URINE COMPREHENSIVE

## 2017-03-08 ENCOUNTER — Telehealth: Payer: Self-pay

## 2017-03-08 ENCOUNTER — Other Ambulatory Visit: Payer: Self-pay | Admitting: Urology

## 2017-03-08 MED ORDER — LEVOFLOXACIN 250 MG PO TABS: 250.0000 mg | ORAL_TABLET | Freq: Every day | ORAL | 0 refills | Status: DC

## 2017-03-08 NOTE — Telephone Encounter (Signed)
Patient notified and verbalized understanding. 

## 2017-03-08 NOTE — Telephone Encounter (Signed)
-----   Message from Abbie Sons, MD sent at 03/08/2017  7:36 AM EST ----- Urine culture positive but resistant to prescribed antibiotic.  Discontinue the amoxicillin.  A different antibiotic was sent to her pharmacy.

## 2017-03-19 HISTORY — PX: SKIN CANCER EXCISION: SHX779

## 2017-03-25 ENCOUNTER — Telehealth: Payer: Self-pay

## 2017-03-25 ENCOUNTER — Other Ambulatory Visit: Payer: Self-pay | Admitting: Primary Care

## 2017-03-25 DIAGNOSIS — E2839 Other primary ovarian failure: Secondary | ICD-10-CM

## 2017-03-25 NOTE — Telephone Encounter (Signed)
Copied from [facility]. Topic: Referral - Request >> Mar 25, 2017  9:26 AM Conception Chancy, NT wrote: Reason for CRM: patient is requesting a referral to [facility] to get her bone density test done.

## 2017-03-25 NOTE — Telephone Encounter (Signed)
Last annual 12/05/16; last Dexa 2017 showed osteopenia.Please advise.

## 2017-03-25 NOTE — Telephone Encounter (Signed)
Please notify patient that I put the orders in for the bone density, have her notify [facility].

## 2017-03-26 DIAGNOSIS — L821 Other seborrheic keratosis: Secondary | ICD-10-CM | POA: Diagnosis not present

## 2017-03-26 DIAGNOSIS — Z85828 Personal history of other malignant neoplasm of skin: Secondary | ICD-10-CM | POA: Diagnosis not present

## 2017-03-26 DIAGNOSIS — D1801 Hemangioma of skin and subcutaneous tissue: Secondary | ICD-10-CM | POA: Diagnosis not present

## 2017-03-26 DIAGNOSIS — L820 Inflamed seborrheic keratosis: Secondary | ICD-10-CM | POA: Diagnosis not present

## 2017-03-26 DIAGNOSIS — L7211 Pilar cyst: Secondary | ICD-10-CM | POA: Diagnosis not present

## 2017-03-26 DIAGNOSIS — D692 Other nonthrombocytopenic purpura: Secondary | ICD-10-CM | POA: Diagnosis not present

## 2017-03-26 NOTE — Telephone Encounter (Signed)
Lm on pts vm and advised referral placed

## 2017-04-01 ENCOUNTER — Other Ambulatory Visit: Payer: Self-pay

## 2017-04-10 ENCOUNTER — Encounter: Payer: Self-pay | Admitting: Primary Care

## 2017-04-10 DIAGNOSIS — M8589 Other specified disorders of bone density and structure, multiple sites: Secondary | ICD-10-CM | POA: Diagnosis not present

## 2017-04-16 ENCOUNTER — Other Ambulatory Visit: Payer: Self-pay | Admitting: Primary Care

## 2017-04-16 DIAGNOSIS — F411 Generalized anxiety disorder: Secondary | ICD-10-CM

## 2017-04-16 NOTE — Telephone Encounter (Signed)
Okay to phone in, #90, no refills

## 2017-04-16 NOTE — Telephone Encounter (Signed)
Called in medication to the pharmacy as instructed. 

## 2017-04-16 NOTE — Telephone Encounter (Signed)
Ok to refill? Electronically refill request for ALPRAZolam Duanne Moron) 0.5 MG tablet  Last prescribed on 01/16/2017. Lasts seen on 11/21/2016

## 2017-04-25 ENCOUNTER — Telehealth: Payer: Self-pay | Admitting: Primary Care

## 2017-04-25 NOTE — Telephone Encounter (Signed)
Pt would like the results of her bone density mailed to her

## 2017-05-13 DIAGNOSIS — Z1231 Encounter for screening mammogram for malignant neoplasm of breast: Secondary | ICD-10-CM | POA: Diagnosis not present

## 2017-06-04 ENCOUNTER — Encounter: Payer: Self-pay | Admitting: Primary Care

## 2017-06-04 ENCOUNTER — Ambulatory Visit (INDEPENDENT_AMBULATORY_CARE_PROVIDER_SITE_OTHER): Payer: Medicare Other | Admitting: Primary Care

## 2017-06-04 VITALS — BP 172/92 | HR 74 | Temp 98.1°F | Wt 150.8 lb

## 2017-06-04 DIAGNOSIS — E039 Hypothyroidism, unspecified: Secondary | ICD-10-CM | POA: Diagnosis not present

## 2017-06-04 DIAGNOSIS — F411 Generalized anxiety disorder: Secondary | ICD-10-CM

## 2017-06-04 DIAGNOSIS — I10 Essential (primary) hypertension: Secondary | ICD-10-CM | POA: Diagnosis not present

## 2017-06-04 DIAGNOSIS — R739 Hyperglycemia, unspecified: Secondary | ICD-10-CM

## 2017-06-04 MED ORDER — AMLODIPINE BESY-BENAZEPRIL HCL 5-20 MG PO CAPS: ORAL_CAPSULE | ORAL | 0 refills | Status: DC

## 2017-06-04 MED ORDER — CITALOPRAM HYDROBROMIDE 10 MG PO TABS: ORAL_TABLET | ORAL | 1 refills | Status: DC

## 2017-06-04 NOTE — Patient Instructions (Signed)
Stop your Amlodipine 5 mg tablets for high blood pressure.  Start amlodipine-benazepril 5-20 mg capsules for high blood pressure. Take 1 capsule once daily.  Continue taking hydrochlorothiazide 25 mg tablets for blood pressure.  Continue taking metoprolol tartrate 50 mg twice daily for blood pressure.  Start citalopram 10 mg tablets once daily for anxiety in 1 week.  Start monitoring your blood pressure daily, around the same time of day, for the next 2 weeks.  Ensure that you have rested for 30 minutes prior to checking your blood pressure. Record your readings and bring them to your next visit.  Stop by the lab prior to leaving today. I will notify you of your results once received.   Schedule a follow up visit in 2 weeks for blood pressure check.  It was a pleasure to see you today!

## 2017-06-04 NOTE — Progress Notes (Signed)
Subjective:    Patient ID: Ashlee Lopez, female    DOB: 05-11-1933, 84 y.o.   MRN: [id number]  HPI  Ashlee Lopez is an 84 year old female who presents today for follow up.   1) Essential Hypertension: Currently managed on Amlodipine 5 mg, HCTZ 25 mg, metoprolol tartrate 50 mg BID. She's not checking her home BP regularly, does have two readings: 04/11/17 with 173/100, February 2019 158/93. She denies headaches, dizziness, chest pain.   BP Readings from Last 3 Encounters:  06/04/17 (!) 172/92  03/04/17 (!) 177/96  02/22/17 (!) 156/80     2) Hypothyroidism: Currently managed on levothyroxine 100 mcg tablets. Last TSH of 2.5 in September 2018.   3) Hyperglycemia: Fasting glucose reading of 108 on labs from September 2018. She is due for A1C check today.  4) GAD: Chronic anxiety for 20+ years. She is managed on alprazolam for which she's taken twice daily, everyday for 20+ years. She was tried on Zoloft about one year ago which caused her to feel "bad" so she stopped taking it after 2-3 days. She continues to struggle with anxiety as she's helping to care for her sister who has dementia. Her alprazolam isn't helping. GAD 7 score of 19 today.   Review of Systems  Eyes: Negative for visual disturbance.  Respiratory: Negative for shortness of breath.   Cardiovascular: Negative for chest pain.  Neurological: Negative for dizziness.  Psychiatric/Behavioral: The patient is nervous/anxious.        See HPI       Past Medical History:  Diagnosis Date  . Benign essential HTN 03/21/2011  . Breast cancer, stage 1 ([place]) 02/10/2003   Right tubular breast cancer  . Cancer ([place])   . Diverticulosis of colon 03/23/2011  . Family history of breast cancer   . Family history of colon cancer   . Family history of melanoma   . Family history of prostate cancer   . Fibrocystic disease of breast 03/23/2011  . Graves' disease with exophthalmos 03/23/2011  . Hypertension   . Hypothyroid 03/21/2011  . IBS  (irritable bowel syndrome) 03/23/2011  . ITP (idiopathic thrombocytopenic purpura) 03/21/2011  . S/P splenectomy 03/21/2011  . Uterus cancer ([place]) 03/23/1999   Well differentiated AdenoCA of endometrium-superficially confined  . Varicose vein of leg 03/23/2011     Social History   Socioeconomic History  . Marital status: Widowed    Spouse name: Not on file  . Number of children: Not on file  . Years of education: Not on file  . Highest education level: Not on file  Social Needs  . Financial resource strain: Not on file  . Food insecurity - worry: Not on file  . Food insecurity - inability: Not on file  . Transportation needs - medical: Not on file  . Transportation needs - non-medical: Not on file  Occupational History  . Not on file  Tobacco Use  . Smoking status: Never Smoker  . Smokeless tobacco: Never Used  Substance and Sexual Activity  . Alcohol use: No  . Drug use: No  . Sexual activity: No    Birth control/protection: Surgical  Other Topics Concern  . Not on file  Social History Narrative   Widow. Lives alone.    3 children, 5 grandchildren.   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   Enjoys reading, watching TV.    Past Surgical History:  Procedure Laterality Date  . ABDOMINAL HYSTERECTOMY  2001  . APPENDECTOMY  1941  .  BREAST SURGERY  02/17/2003   Mastectomy-Right  . MASTECTOMY  2004   Dr Margot Chimes  . OOPHORECTOMY     BSO  . SPLENECTOMY  1986    Family History  Problem Relation Age of Onset  . Breast cancer Mother        Age 63  . Hypertension Father   . Heart disease Father   . Lung cancer Father   . Breast cancer Sister        Age 62  . Melanoma Sister        dx in her 53s  . Dementia Sister   . Heart disease Maternal Aunt   . Prostate cancer Maternal Uncle   . Colon cancer Maternal Grandfather   . Colon cancer Maternal Uncle   . Prostate cancer Maternal Uncle   . Dementia Maternal Aunt   . Breast cancer Cousin        maternal 2nd cousin  . Breast  cancer Cousin        maternal first cousin dx in her 38s  . Melanoma Son        dx in his 65s    Allergies  Allergen Reactions  . Sulfamethoxazole-Trimethoprim Hives  . Septra [Bactrim]     Current Outpatient Medications on File Prior to Visit  Medication Sig Dispense Refill  . ALPRAZolam (XANAX) 0.5 MG tablet TAKE 1 TABLET BY MOUTH DAILY AS NEEDED FOR ANXIETY 90 tablet 0  . amLODipine (NORVASC) 5 MG tablet TAKE 1 TABLET BY MOUTH ONCE A DAY 90 tablet 1  . aspirin 81 MG tablet Take 81 mg by mouth daily.      Marland Kitchen CALCIUM-VITAMIN D PO Take by mouth.      . CRANBERRY PO Take 3 tablets by mouth daily.    . Diphenhydramine-APAP, sleep, (TYLENOL PM EXTRA STRENGTH PO) Take by mouth.      . hydrochlorothiazide (HYDRODIURIL) 25 MG tablet TAKE 1 TABLET BY MOUTH ONCE DAILY 90 tablet 0  . levothyroxine (SYNTHROID, LEVOTHROID) 100 MCG tablet TAKE 1 TABLET BY MOUTH ONCE DAILY 90 tablet 0  . metoprolol tartrate (LOPRESSOR) 50 MG tablet TAKE 1 TABLET BY MOUTH TWICE (2) DAILY 180 tablet 1  . Multiple Vitamin (MULTI-VITAMIN PO) Take by mouth.      . ranitidine (ZANTAC) 150 MG tablet TAKE 1 TABLET BY MOUTH ONCE DAILY 90 tablet 0  . rosuvastatin (CRESTOR) 5 MG tablet TAKE 1 TABLET BY MOUTH EVERY OTHER DAY 90 tablet 0   No current facility-administered medications on file prior to visit.     BP (!) 172/92   Pulse 74   Temp 98.1 F (36.7 C) (Oral)   Wt 150 lb 12 oz (68.4 kg)   SpO2 98%   BMI 25.88 kg/m    Objective:   Physical Exam  Constitutional: She appears well-nourished.  Neck: Neck supple.  Cardiovascular: Normal rate and regular rhythm.  Pulmonary/Chest: Effort normal and breath sounds normal.  Skin: Skin is warm and dry.  Psychiatric: She has a normal mood and affect.          Assessment & Plan:  Hyperglycemia:  Noted on fasting labs from September 2018. Check A1C today.  Pleas Koch, NP

## 2017-06-04 NOTE — Assessment & Plan Note (Signed)
Repeat TSH pending today.

## 2017-06-04 NOTE — Assessment & Plan Note (Signed)
Uncontrolled with GAD 7 score of 19 today.  Long discussion regarding benzo use being inappropriate in both daily anxiety symptoms and also in the elderly. She is agreeable to trying another medication for anxiety.  Rx for citalopram sent to pharmacy. We discussed possible side effects of headache, GI upset, drowsiness, and SI/HI. If thoughts of SI/HI develop, we discussed to present to the emergency immediately. Patient verbalized understanding.   Use alprazolam sparingly PRN.  Follow up in 6 weeks for re-evaluation.

## 2017-06-04 NOTE — Assessment & Plan Note (Signed)
Above goal in the office on several visits, also with two home readings.   Rx for amlodipine-benazepril 5-20 sent to pharmacy today. Hold Amlodipine 5 mg.  Continue metoprolol 50 mg BID and HCTZ 25 mg daily.  Will have her start monitoring BP at home and follow up in 2 weeks with readings and BMP.

## 2017-06-05 LAB — HEMOGLOBIN A1C
Est. average glucose Bld gHb Est-mCnc: 120 mg/dL
Hgb A1c MFr Bld: 5.8 % — ABNORMAL HIGH (ref 4.8–5.6)

## 2017-06-05 LAB — TSH: TSH: 3.47 u[IU]/mL (ref 0.450–4.500)

## 2017-06-12 ENCOUNTER — Ambulatory Visit: Payer: Self-pay | Admitting: *Deleted

## 2017-06-12 NOTE — Telephone Encounter (Signed)
Returned patients  Phone  Call  Pt  Wanted  To  Know  If   It  Was  Ok  To  Take  Her  Celexa  With  Her  Xanax  Plan of  Care  Discussed   With patient  From previous  Visit  And  Pt  Was  Advised  To  Use  The  Xanax  Sparingly  When needed  Pt  Reminded of upcoming  followup appointment

## 2017-06-18 ENCOUNTER — Encounter: Payer: Self-pay | Admitting: Primary Care

## 2017-06-18 ENCOUNTER — Ambulatory Visit (INDEPENDENT_AMBULATORY_CARE_PROVIDER_SITE_OTHER): Payer: Medicare Other | Admitting: Primary Care

## 2017-06-18 DIAGNOSIS — I10 Essential (primary) hypertension: Secondary | ICD-10-CM

## 2017-06-18 DIAGNOSIS — F411 Generalized anxiety disorder: Secondary | ICD-10-CM

## 2017-06-18 MED ORDER — AMLODIPINE BESY-BENAZEPRIL HCL 5-20 MG PO CAPS: ORAL_CAPSULE | ORAL | 2 refills | Status: DC

## 2017-06-18 NOTE — Assessment & Plan Note (Signed)
Improved in the office today, also on home. Continue Lotrel, Lopressor, HCTZ. BMP pending today.

## 2017-06-18 NOTE — Addendum Note (Signed)
Addended by: Ellamae Sia on: 06/18/2017 10:46 AM   Modules accepted: Orders

## 2017-06-18 NOTE — Patient Instructions (Signed)
Stop by the front to ask about a therapy referral for [place] on Thursdays.  Stop by the lab prior to leaving today. I will notify you of your results once received.   Continue taking citalopram once daily for anxiety, use the alprazolam very sparingly.  Please schedule a follow up appointment in 1 month for re-evaluation of anxiety.  It was a pleasure to see you today!

## 2017-06-18 NOTE — Progress Notes (Signed)
Subjective:    Patient ID: Ashlee Lopez, female    DOB: March 12, 1934, 84 y.o.   MRN: [id number]  HPI  Ashlee Lopez is an 84 year old female who presents today for follow up.  1) Essential Hypertension: Currently managed on metoprolol tartrate 50 mg BID, HCTZ 25 mg, and amlodipine-benazepril 5-20 mg. During her last visit amlodipine-benazepril was added given continuous elevated readings despite prior regimen.  BP Readings from Last 3 Encounters:  06/18/17 136/84  06/04/17 (!) 172/92  03/04/17 (!) 177/96   She's been monitoring her BP at home and is getting readings of 120's-130's/80's. She denies chest pain, dizziness, shortness of breath.   2) GAD: Currently managed on citalopram 10 mg for which was initiated during her last visit several weeks ago. She endorsed a tremendous amount of stress as the caregiver of a family member. She had been on alprazolam 0.5 mg daily to twice daily for years without improvement.   Since her last visit she's continuing to use her alprazolam daily. She's not noticed much difference as of yet. She has noticed some nausea on the Citalopram but this is tolerable. She has not connected with a therapist as they do not accept her insurance.   Review of Systems  Respiratory: Negative for shortness of breath.   Cardiovascular: Negative for chest pain.  Gastrointestinal: Positive for nausea.  Neurological: Negative for dizziness and headaches.  Psychiatric/Behavioral:       See HPI       Past Medical History:  Diagnosis Date  . Benign essential HTN 03/21/2011  . Breast cancer, stage 1 ([place]) 02/10/2003   Right tubular breast cancer  . Cancer ([place])   . Diverticulosis of colon 03/23/2011  . Family history of breast cancer   . Family history of colon cancer   . Family history of melanoma   . Family history of prostate cancer   . Fibrocystic disease of breast 03/23/2011  . Graves' disease with exophthalmos 03/23/2011  . Hypertension   . Hypothyroid 03/21/2011  .  IBS (irritable bowel syndrome) 03/23/2011  . ITP (idiopathic thrombocytopenic purpura) 03/21/2011  . S/P splenectomy 03/21/2011  . Uterus cancer ([place]) 03/23/1999   Well differentiated AdenoCA of endometrium-superficially confined  . Varicose vein of leg 03/23/2011     Social History   Socioeconomic History  . Marital status: Widowed    Spouse name: Not on file  . Number of children: Not on file  . Years of education: Not on file  . Highest education level: Not on file  Occupational History  . Not on file  Social Needs  . Financial resource strain: Not on file  . Food insecurity:    Worry: Not on file    Inability: Not on file  . Transportation needs:    Medical: Not on file    Non-medical: Not on file  Tobacco Use  . Smoking status: Never Smoker  . Smokeless tobacco: Never Used  Substance and Sexual Activity  . Alcohol use: No  . Drug use: No  . Sexual activity: Never    Birth control/protection: Surgical  Lifestyle  . Physical activity:    Days per week: Not on file    Minutes per session: Not on file  . Stress: Not on file  Relationships  . Social connections:    Talks on phone: Not on file    Gets together: Not on file    Attends religious service: Not on file    Active member of club  or organization: Not on file    Attends meetings of clubs or organizations: Not on file    Relationship status: Not on file  . Intimate partner violence:    Fear of current or ex partner: Not on file    Emotionally abused: Not on file    Physically abused: Not on file    Forced sexual activity: Not on file  Other Topics Concern  . Not on file  Social History Narrative   Widow. Lives alone.    3 children, 5 grandchildren.   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   Enjoys reading, watching TV.    Past Surgical History:  Procedure Laterality Date  . ABDOMINAL HYSTERECTOMY  2001  . APPENDECTOMY  1941  . BREAST SURGERY  02/17/2003   Mastectomy-Right  . MASTECTOMY  2004   Dr Margot Chimes  .  OOPHORECTOMY     BSO  . SPLENECTOMY  1986    Family History  Problem Relation Age of Onset  . Breast cancer Mother        Age 50  . Hypertension Father   . Heart disease Father   . Lung cancer Father   . Breast cancer Sister        Age 9  . Melanoma Sister        dx in her 28s  . Dementia Sister   . Heart disease Maternal Aunt   . Prostate cancer Maternal Uncle   . Colon cancer Maternal Grandfather   . Colon cancer Maternal Uncle   . Prostate cancer Maternal Uncle   . Dementia Maternal Aunt   . Breast cancer Cousin        maternal 2nd cousin  . Breast cancer Cousin        maternal first cousin dx in her 69s  . Melanoma Son        dx in his 36s    Allergies  Allergen Reactions  . Sulfamethoxazole-Trimethoprim Hives  . Septra [Bactrim]     Current Outpatient Medications on File Prior to Visit  Medication Sig Dispense Refill  . ALPRAZolam (XANAX) 0.5 MG tablet TAKE 1 TABLET BY MOUTH DAILY AS NEEDED FOR ANXIETY 90 tablet 0  . amLODipine (NORVASC) 5 MG tablet TAKE 1 TABLET BY MOUTH ONCE A DAY 90 tablet 1  . amLODipine-benazepril (LOTREL) 5-20 MG capsule Take 1 capsule by mouth once daily for high blood pressure. 30 capsule 0  . aspirin 81 MG tablet Take 81 mg by mouth daily.      Marland Kitchen CALCIUM-VITAMIN D PO Take by mouth.      . citalopram (CELEXA) 10 MG tablet Take 1 tablet by mouth once daily for anxiety. 30 tablet 1  . CRANBERRY PO Take 3 tablets by mouth daily.    . Diphenhydramine-APAP, sleep, (TYLENOL PM EXTRA STRENGTH PO) Take by mouth.      . hydrochlorothiazide (HYDRODIURIL) 25 MG tablet TAKE 1 TABLET BY MOUTH ONCE DAILY 90 tablet 0  . levothyroxine (SYNTHROID, LEVOTHROID) 100 MCG tablet TAKE 1 TABLET BY MOUTH ONCE DAILY 90 tablet 0  . metoprolol tartrate (LOPRESSOR) 50 MG tablet TAKE 1 TABLET BY MOUTH TWICE (2) DAILY 180 tablet 1  . Multiple Vitamin (MULTI-VITAMIN PO) Take by mouth.      . ranitidine (ZANTAC) 150 MG tablet TAKE 1 TABLET BY MOUTH ONCE DAILY 90  tablet 0  . rosuvastatin (CRESTOR) 5 MG tablet TAKE 1 TABLET BY MOUTH EVERY OTHER DAY 90 tablet 0   No current facility-administered medications on  file prior to visit.     BP 136/84   Pulse 80   Temp 97.8 F (36.6 C) (Oral)   Ht 5\' 4"  (1.626 m)   Wt 150 lb 12 oz (68.4 kg)   SpO2 98%   BMI 25.88 kg/m    Objective:   Physical Exam  Constitutional: She appears well-nourished.  Neck: Neck supple.  Cardiovascular: Normal rate and regular rhythm.  Pulmonary/Chest: Effort normal and breath sounds normal.  Skin: Skin is warm and dry.  Psychiatric: She has a normal mood and affect.          Assessment & Plan:

## 2017-06-18 NOTE — Assessment & Plan Note (Signed)
No significant improvement on citalopram 10 mg yet, will allow for more time. Discussed to use alprazolam sparingly as the goal will be to wean off. She will connect with therapy by speaking with the front desk today.  Follow up in 1 month for re-evaluation, consider increasing dose to 20 mg if no improvement.

## 2017-06-19 LAB — BASIC METABOLIC PANEL
BUN/Creatinine Ratio: 13 (ref 12–28)
BUN: 9 mg/dL (ref 8–27)
CALCIUM: 9.8 mg/dL (ref 8.7–10.3)
CO2: 27 mmol/L (ref 20–29)
CREATININE: 0.67 mg/dL (ref 0.57–1.00)
Chloride: 92 mmol/L — ABNORMAL LOW (ref 96–106)
GFR, EST AFRICAN AMERICAN: 95 mL/min/{1.73_m2} (ref >59)
GFR, EST NON AFRICAN AMERICAN: 83 mL/min/{1.73_m2} (ref >59)
Glucose: 95 mg/dL (ref 65–99)
POTASSIUM: 3.7 mmol/L (ref 3.5–5.2)
Sodium: 131 mmol/L — ABNORMAL LOW (ref 134–144)

## 2017-06-24 ENCOUNTER — Other Ambulatory Visit: Payer: Self-pay | Admitting: Primary Care

## 2017-06-25 ENCOUNTER — Ambulatory Visit (INDEPENDENT_AMBULATORY_CARE_PROVIDER_SITE_OTHER): Payer: Medicare Other | Admitting: Psychology

## 2017-06-25 DIAGNOSIS — F418 Other specified anxiety disorders: Secondary | ICD-10-CM

## 2017-06-28 ENCOUNTER — Other Ambulatory Visit: Payer: Self-pay | Admitting: Primary Care

## 2017-07-03 ENCOUNTER — Other Ambulatory Visit: Payer: Self-pay | Admitting: Primary Care

## 2017-07-03 DIAGNOSIS — F411 Generalized anxiety disorder: Secondary | ICD-10-CM

## 2017-07-09 ENCOUNTER — Ambulatory Visit (INDEPENDENT_AMBULATORY_CARE_PROVIDER_SITE_OTHER): Payer: Medicare Other | Admitting: Psychology

## 2017-07-09 DIAGNOSIS — F418 Other specified anxiety disorders: Secondary | ICD-10-CM

## 2017-07-12 ENCOUNTER — Ambulatory Visit (INDEPENDENT_AMBULATORY_CARE_PROVIDER_SITE_OTHER): Payer: Medicare Other | Admitting: Family Medicine

## 2017-07-12 ENCOUNTER — Other Ambulatory Visit: Payer: Self-pay | Admitting: Urology

## 2017-07-12 ENCOUNTER — Encounter: Payer: Self-pay | Admitting: Family Medicine

## 2017-07-12 VITALS — BP 176/76 | HR 79 | Ht 64.0 in | Wt 149.6 lb

## 2017-07-12 DIAGNOSIS — R300 Dysuria: Secondary | ICD-10-CM

## 2017-07-12 LAB — URINALYSIS, COMPLETE
BILIRUBIN UA: NEGATIVE
GLUCOSE, UA: NEGATIVE
Ketones, UA: NEGATIVE
NITRITE UA: POSITIVE — AB
PH UA: 7.5 (ref 5.0–7.5)
Specific Gravity, UA: 1.015 (ref 1.005–1.030)
UUROB: 0.2 mg/dL (ref 0.2–1.0)

## 2017-07-12 LAB — MICROSCOPIC EXAMINATION

## 2017-07-12 MED ORDER — DOXYCYCLINE HYCLATE 100 MG PO CAPS: 100.0000 mg | ORAL_CAPSULE | Freq: Two times a day (BID) | ORAL | 0 refills | Status: DC

## 2017-07-12 NOTE — Progress Notes (Signed)
Patient presents today with urinary frequency and urgency. Her symptoms started 3 days ago. A urine was collected for UA, UCX. She has not had any Urological surgeries or been on ABX in the last 30 days.

## 2017-07-14 LAB — CULTURE, URINE COMPREHENSIVE

## 2017-07-15 ENCOUNTER — Telehealth: Payer: Self-pay

## 2017-07-15 NOTE — Telephone Encounter (Signed)
-----   Message from Abbie Sons, MD sent at 07/15/2017 12:32 PM EDT ----- Urine culture was positive and sensitive to prescribed antibiotic.

## 2017-07-15 NOTE — Telephone Encounter (Signed)
Patient notified on vmail 

## 2017-07-22 ENCOUNTER — Other Ambulatory Visit: Payer: Self-pay | Admitting: Primary Care

## 2017-07-22 DIAGNOSIS — F411 Generalized anxiety disorder: Secondary | ICD-10-CM

## 2017-07-22 NOTE — Telephone Encounter (Signed)
Electronic refill request Last refill 04/16/17 #90 Last office visit 06/18/17

## 2017-07-22 NOTE — Telephone Encounter (Signed)
Will discuss at patient's upcoming visit tomorrow.

## 2017-07-23 ENCOUNTER — Encounter: Payer: Self-pay | Admitting: Primary Care

## 2017-07-23 ENCOUNTER — Ambulatory Visit (INDEPENDENT_AMBULATORY_CARE_PROVIDER_SITE_OTHER): Payer: Medicare Other | Admitting: Primary Care

## 2017-07-23 DIAGNOSIS — F411 Generalized anxiety disorder: Secondary | ICD-10-CM | POA: Diagnosis not present

## 2017-07-23 DIAGNOSIS — I10 Essential (primary) hypertension: Secondary | ICD-10-CM | POA: Diagnosis not present

## 2017-07-23 MED ORDER — CITALOPRAM HYDROBROMIDE 20 MG PO TABS: ORAL_TABLET | ORAL | 0 refills | Status: DC

## 2017-07-23 MED ORDER — ALPRAZOLAM 0.5 MG PO TABS: ORAL_TABLET | ORAL | 0 refills | Status: DC

## 2017-07-23 NOTE — Assessment & Plan Note (Addendum)
Above goal in the office today, improved on recheck. Home readings stable. Suspect elevated readings also secondary to anxiety and stress with her sister.   Will have her continue to monitor BP at home and report readings at or above 150/90. Continue current regimen.

## 2017-07-23 NOTE — Progress Notes (Signed)
Subjective:    Patient ID: Ashlee Lopez, female    DOB: 09-24-1933, 84 y.o.   MRN: [id number]  HPI  [name] is an 84 year old female who presents today for follow up.   1) GAD: She is currently managed on citalopram 10 mg which was initiated in mid March 2019 for uncontrolled anxiety. She has been managed on alprazolam twice daily, scheduled, for the last 20+ years.   Since initiation on citalopram she's noticed some improvement as she's feeling "better". She does continue to experience anxiety in the afternoon. She's been taking her alprazolam once daily and ran out yesterday.   She denies SI/HI, headaches, nausea. She is still under a lot of stress caring for her sister with Alzheimer's Disease. She recently was provided with a caregiver that will be sitting with her sister for 4 hours three days weekly.   She's been meeting with her therapist every other week and has enjoyed her sessions.   2) Essential Hypertension: Currently managed on amlodipine-benazepril 5-20 mg, HCTZ 25 mg, metoprolol tartrate 50 mg BID. She's been checking her blood pressure at home which is running 138/82, 142/81. 147/90.   She's under a lot of stress at home as she is caring for her sister who has moderate Alzheimer's Disease. She denies dizziness, chest pain.    BP Readings from Last 3 Encounters:  07/23/17 (!) 150/78  07/12/17 (!) 176/76  06/18/17 136/84     Review of Systems  Eyes: Negative for visual disturbance.  Respiratory: Negative for shortness of breath.   Cardiovascular: Negative for chest pain.  Neurological: Negative for dizziness and headaches.  Psychiatric/Behavioral: Negative for suicidal ideas. The patient is nervous/anxious.        See HPI      Past Medical History:  Diagnosis Date  . Benign essential HTN 03/21/2011  . Breast cancer, stage 1 ([place]) 02/10/2003   Right tubular breast cancer  . Cancer ([place])   . Diverticulosis of colon 03/23/2011  . Family history of breast cancer    . Family history of colon cancer   . Family history of melanoma   . Family history of prostate cancer   . Fibrocystic disease of breast 03/23/2011  . Graves' disease with exophthalmos 03/23/2011  . Hypertension   . Hypothyroid 03/21/2011  . IBS (irritable bowel syndrome) 03/23/2011  . ITP (idiopathic thrombocytopenic purpura) 03/21/2011  . S/P splenectomy 03/21/2011  . Uterus cancer ([place]) 03/23/1999   Well differentiated AdenoCA of endometrium-superficially confined  . Varicose vein of leg 03/23/2011     Social History   Socioeconomic History  . Marital status: Widowed    Spouse name: Not on file  . Number of children: Not on file  . Years of education: Not on file  . Highest education level: Not on file  Occupational History  . Not on file  Social Needs  . Financial resource strain: Not on file  . Food insecurity:    Worry: Not on file    Inability: Not on file  . Transportation needs:    Medical: Not on file    Non-medical: Not on file  Tobacco Use  . Smoking status: Never Smoker  . Smokeless tobacco: Never Used  Substance and Sexual Activity  . Alcohol use: No  . Drug use: No  . Sexual activity: Never    Birth control/protection: Surgical  Lifestyle  . Physical activity:    Days per week: Not on file    Minutes per session:  Not on file  . Stress: Not on file  Relationships  . Social connections:    Talks on phone: Not on file    Gets together: Not on file    Attends religious service: Not on file    Active member of club or organization: Not on file    Attends meetings of clubs or organizations: Not on file    Relationship status: Not on file  . Intimate partner violence:    Fear of current or ex partner: Not on file    Emotionally abused: Not on file    Physically abused: Not on file    Forced sexual activity: Not on file  Other Topics Concern  . Not on file  Social History Narrative   Widow. Lives alone.    3 children, 5 grandchildren.   Retired. Once worked in  Insurance underwriter.   Enjoys reading, watching TV.    Past Surgical History:  Procedure Laterality Date  . ABDOMINAL HYSTERECTOMY  2001  . APPENDECTOMY  1941  . BREAST SURGERY  02/17/2003   Mastectomy-Right  . MASTECTOMY  2004   Dr Margot Chimes  . OOPHORECTOMY     BSO  . SPLENECTOMY  1986    Family History  Problem Relation Age of Onset  . Breast cancer Mother        Age 15  . Hypertension Father   . Heart disease Father   . Lung cancer Father   . Breast cancer Sister        Age 63  . Melanoma Sister        dx in her 61s  . Dementia Sister   . Heart disease Maternal Aunt   . Prostate cancer Maternal Uncle   . Colon cancer Maternal Grandfather   . Colon cancer Maternal Uncle   . Prostate cancer Maternal Uncle   . Dementia Maternal Aunt   . Breast cancer Cousin        maternal 2nd cousin  . Breast cancer Cousin        maternal first cousin dx in her 23s  . Melanoma Son        dx in his 37s    Allergies  Allergen Reactions  . Sulfamethoxazole-Trimethoprim Hives  . Septra [Bactrim]     Current Outpatient Medications on File Prior to Visit  Medication Sig Dispense Refill  . amLODipine-benazepril (LOTREL) 5-20 MG capsule Take 1 capsule by mouth once daily for high blood pressure. 90 capsule 2  . aspirin 81 MG tablet Take 81 mg by mouth daily.      Marland Kitchen CALCIUM-VITAMIN D PO Take by mouth.      . CRANBERRY PO Take 3 tablets by mouth daily.    . Diphenhydramine-APAP, sleep, (TYLENOL PM EXTRA STRENGTH PO) Take by mouth.      . doxycycline (VIBRAMYCIN) 100 MG capsule Take 1 capsule (100 mg total) by mouth every 12 (twelve) hours. 14 capsule 0  . hydrochlorothiazide (HYDRODIURIL) 25 MG tablet TAKE 1 TABLET BY MOUTH ONCE A DAY 90 tablet 1  . levothyroxine (SYNTHROID, LEVOTHROID) 100 MCG tablet TAKE 1 TABLET BY MOUTH ONCE A DAY 90 tablet 1  . metoprolol tartrate (LOPRESSOR) 50 MG tablet TAKE 1 TABLET BY MOUTH TWICE (2) DAILY 180 tablet 1  . Multiple Vitamin (MULTI-VITAMIN PO) Take by  mouth.      . ranitidine (ZANTAC) 150 MG tablet TAKE 1 TABLET BY MOUTH ONCE A DAY 90 tablet 1  . rosuvastatin (CRESTOR) 5 MG tablet TAKE 1  TABLET BY MOUTH EVERY OTHER DAY 90 tablet 0   No current facility-administered medications on file prior to visit.     BP (!) 146/76   Pulse 69   Temp 98.2 F (36.8 C) (Oral)   Ht 5\' 4"  (1.626 m)   Wt 148 lb 8 oz (67.4 kg)   SpO2 97%   BMI 25.49 kg/m    Objective:   Physical Exam  Constitutional: She appears well-nourished.  Neck: Neck supple.  Cardiovascular: Normal rate and regular rhythm.  Pulmonary/Chest: Effort normal and breath sounds normal.  Skin: Skin is warm and dry.  Psychiatric: She has a normal mood and affect.          Assessment & Plan:

## 2017-07-23 NOTE — Patient Instructions (Signed)
We've increased your Citalopram to 20 mg. Take 1 tablet by mouth once daily for anxiety.  Use the alprazolam only as needed for anxiety. Use sparingly.  Please notify me if your blood pressure runs at or above 150/90 consistently.  It was a pleasure to see you today!

## 2017-07-23 NOTE — Assessment & Plan Note (Signed)
Slight improvement on citalopram 10 mg, no side effects. Will increase to 20 mg for further improvement in symptoms. Discussed to use alprazolam sparingly, try to cut tablets in half. Goal is to use sparingly only.  We will call her for an update in the citalopram dose in 4 weeks. She will notify sooner if she has any problems.  Continue therapy.

## 2017-07-24 ENCOUNTER — Ambulatory Visit (INDEPENDENT_AMBULATORY_CARE_PROVIDER_SITE_OTHER): Payer: Medicare Other | Admitting: Psychology

## 2017-07-24 DIAGNOSIS — F418 Other specified anxiety disorders: Secondary | ICD-10-CM | POA: Diagnosis not present

## 2017-08-12 ENCOUNTER — Other Ambulatory Visit: Payer: Self-pay | Admitting: Primary Care

## 2017-08-13 NOTE — Telephone Encounter (Signed)
Patient scheduled for AWV on 12/11/17.   LR 02/18/17 for #90, 0 refills. Note: patient takes every other day.  Will refill X 1 (48month supply) to carry patient through to her next appointment.

## 2017-08-21 ENCOUNTER — Ambulatory Visit (INDEPENDENT_AMBULATORY_CARE_PROVIDER_SITE_OTHER): Payer: Medicare Other | Admitting: Psychology

## 2017-08-21 DIAGNOSIS — F418 Other specified anxiety disorders: Secondary | ICD-10-CM

## 2017-08-23 ENCOUNTER — Telehealth: Payer: Self-pay | Admitting: Primary Care

## 2017-08-23 DIAGNOSIS — F411 Generalized anxiety disorder: Secondary | ICD-10-CM

## 2017-08-23 NOTE — Telephone Encounter (Addendum)
-----   Message from Pleas Koch, NP sent at 07/23/2017 11:10 AM EDT ----- Regarding: Anxiety Will you please check on patient? How's she doing since we increased her Citalopram to 20 mg? How often is she using the alprazolam?

## 2017-08-23 NOTE — Telephone Encounter (Signed)
Attempted to contact pt; line busy 

## 2017-08-27 MED ORDER — ALPRAZOLAM 0.5 MG PO TABS: ORAL_TABLET | ORAL | 0 refills | Status: DC

## 2017-08-27 MED ORDER — CITALOPRAM HYDROBROMIDE 20 MG PO TABS: ORAL_TABLET | ORAL | 0 refills | Status: DC

## 2017-08-27 NOTE — Telephone Encounter (Signed)
Spoken to patient. She stated that she take 20 mg of the citalopram just one time. She felt that she crawling up the wall so she went back to taking half a tablet of the 20 mg since then. She is also taking the alprazolam everyday, she take 1/2 tablet daily.

## 2017-08-27 NOTE — Telephone Encounter (Signed)
Noted, I updated this in her medication list.

## 2017-09-02 ENCOUNTER — Other Ambulatory Visit: Payer: Self-pay | Admitting: Primary Care

## 2017-09-02 DIAGNOSIS — F411 Generalized anxiety disorder: Secondary | ICD-10-CM

## 2017-09-05 ENCOUNTER — Other Ambulatory Visit: Payer: Self-pay | Admitting: Primary Care

## 2017-09-05 DIAGNOSIS — F411 Generalized anxiety disorder: Secondary | ICD-10-CM

## 2017-09-05 NOTE — Telephone Encounter (Signed)
Ashlee Lopez with [facility] left v/m that pt last got alprazolam # 30 on 07/23/17. The 08/27/17 alprazolam rx was a no print. Please advise.

## 2017-09-10 ENCOUNTER — Other Ambulatory Visit: Payer: Self-pay | Admitting: Primary Care

## 2017-09-10 DIAGNOSIS — F411 Generalized anxiety disorder: Secondary | ICD-10-CM

## 2017-09-10 MED ORDER — ALPRAZOLAM 0.5 MG PO TABS: ORAL_TABLET | ORAL | 0 refills | Status: DC

## 2017-09-10 NOTE — Telephone Encounter (Signed)
Ok to refill? Refill request for ALPRAZolam Ashlee Lopez) 0.5 MG tablet  Last prescribed on 07/23/2017  Last seen on 07/23/2017

## 2017-09-10 NOTE — Telephone Encounter (Signed)
Noted, refill sent to pharmacy. 

## 2017-09-16 ENCOUNTER — Ambulatory Visit: Payer: Medicare Other

## 2017-09-16 ENCOUNTER — Telehealth: Payer: Self-pay | Admitting: Urology

## 2017-09-16 VITALS — BP 195/94 | HR 74 | Resp 16 | Ht 64.0 in | Wt 142.6 lb

## 2017-09-16 DIAGNOSIS — N390 Urinary tract infection, site not specified: Secondary | ICD-10-CM

## 2017-09-16 LAB — MICROSCOPIC EXAMINATION

## 2017-09-16 LAB — URINALYSIS, COMPLETE
Bilirubin, UA: NEGATIVE
GLUCOSE, UA: NEGATIVE
KETONES UA: NEGATIVE
NITRITE UA: POSITIVE — AB
SPEC GRAV UA: 1.015 (ref 1.005–1.030)
Urobilinogen, Ur: 0.2 mg/dL (ref 0.2–1.0)
pH, UA: 8.5 — ABNORMAL HIGH (ref 5.0–7.5)

## 2017-09-16 MED ORDER — NITROFURANTOIN MONOHYD MACRO 100 MG PO CAPS: 100.0000 mg | ORAL_CAPSULE | Freq: Two times a day (BID) | ORAL | 0 refills | Status: AC

## 2017-09-16 NOTE — Telephone Encounter (Signed)
Pt is having UTI symptoms, added to nurse schedule this morning at 11.

## 2017-09-16 NOTE — Progress Notes (Signed)
Pt present in office today for nurse visit. She is complaining of dysuria, frequency, and difficulty postponing. Urine sample positive for infection, pt started on Macrobid 100mg , bid, for 7 days. Urine sent for culture.

## 2017-09-19 LAB — CULTURE, URINE COMPREHENSIVE

## 2017-09-20 ENCOUNTER — Telehealth: Payer: Self-pay

## 2017-09-20 MED ORDER — AMOXICILLIN-POT CLAVULANATE 875-125 MG PO TABS: 1.0000 | ORAL_TABLET | Freq: Two times a day (BID) | ORAL | 0 refills | Status: AC

## 2017-09-20 NOTE — Telephone Encounter (Signed)
-----   Message from Nori Riis, PA-C sent at 09/20/2017  7:45 AM EDT ----- Pleas let Mrs. Albarracin know that her urine culture was positive for infection.  She needs to start Augmentin 875/125, one tablet twice daily for seven days.

## 2017-09-20 NOTE — Telephone Encounter (Signed)
Pt informed, rx sent into pharmacy.

## 2017-09-23 ENCOUNTER — Other Ambulatory Visit: Payer: Self-pay | Admitting: Primary Care

## 2017-10-23 ENCOUNTER — Other Ambulatory Visit: Payer: Self-pay | Admitting: Primary Care

## 2017-10-23 DIAGNOSIS — F411 Generalized anxiety disorder: Secondary | ICD-10-CM

## 2017-10-23 NOTE — Telephone Encounter (Signed)
Name of Medication: ALPRAZolam Duanne Moron) 0.5 MG tablet   Name of Pharmacy: [facility]   Last Fill or Written Date and Quantity: 09/10/2017  #30   Last Office Visit and Type: 07/23/2017 follow up appt  Next Office Visit and Type: AWV on 12/04/2017  Last Controlled Substance Agreement Date: 04/23/2016  Last UDS:

## 2017-10-23 NOTE — Telephone Encounter (Signed)
Is she actually needing a refill?  How often is she taking? Should be taking 1/2 tablet once daily AS NEEDED and should still have some pills remaining. Remind her that this is only to be taken as needed.

## 2017-10-24 NOTE — Telephone Encounter (Signed)
Message left for patient to return my call.  

## 2017-10-29 NOTE — Telephone Encounter (Signed)
Spoken to patient. She stated that she has been taking 1/2 tablet on most days but she have been taking a full 1 tablet when she been feeling really stressed/ anxiety is up. Patient does need a refill and is out of the Rx

## 2017-10-29 NOTE — Telephone Encounter (Signed)
Have been trying to call patient on home number on 10/24/2017 and 10/29/2017

## 2017-10-29 NOTE — Telephone Encounter (Signed)
Noted, refill sent to pharmacy. Goal is to wean off evenutally. Continue with 1/2 tablet daily PRN, will discuss again at upcoming visit in September.

## 2017-12-02 ENCOUNTER — Other Ambulatory Visit: Payer: Self-pay | Admitting: Primary Care

## 2017-12-02 DIAGNOSIS — I10 Essential (primary) hypertension: Secondary | ICD-10-CM

## 2017-12-02 DIAGNOSIS — R7303 Prediabetes: Secondary | ICD-10-CM

## 2017-12-02 DIAGNOSIS — E039 Hypothyroidism, unspecified: Secondary | ICD-10-CM

## 2017-12-02 DIAGNOSIS — E785 Hyperlipidemia, unspecified: Secondary | ICD-10-CM

## 2017-12-04 ENCOUNTER — Ambulatory Visit: Payer: Medicare Other

## 2017-12-04 ENCOUNTER — Ambulatory Visit (INDEPENDENT_AMBULATORY_CARE_PROVIDER_SITE_OTHER): Payer: Medicare Other

## 2017-12-04 VITALS — BP 166/94 | HR 66 | Temp 97.8°F | Ht 64.0 in | Wt 142.5 lb

## 2017-12-04 DIAGNOSIS — E785 Hyperlipidemia, unspecified: Secondary | ICD-10-CM

## 2017-12-04 DIAGNOSIS — R300 Dysuria: Secondary | ICD-10-CM | POA: Diagnosis not present

## 2017-12-04 DIAGNOSIS — I10 Essential (primary) hypertension: Secondary | ICD-10-CM

## 2017-12-04 DIAGNOSIS — R82998 Other abnormal findings in urine: Secondary | ICD-10-CM | POA: Diagnosis not present

## 2017-12-04 DIAGNOSIS — Z0000 Encounter for general adult medical examination without abnormal findings: Secondary | ICD-10-CM

## 2017-12-04 DIAGNOSIS — R8290 Unspecified abnormal findings in urine: Secondary | ICD-10-CM | POA: Diagnosis not present

## 2017-12-04 DIAGNOSIS — R7303 Prediabetes: Secondary | ICD-10-CM | POA: Diagnosis not present

## 2017-12-04 DIAGNOSIS — E039 Hypothyroidism, unspecified: Secondary | ICD-10-CM

## 2017-12-04 DIAGNOSIS — Z23 Encounter for immunization: Secondary | ICD-10-CM | POA: Diagnosis not present

## 2017-12-04 LAB — POC URINALSYSI DIPSTICK (AUTOMATED)
Bilirubin, UA: NEGATIVE
Blood, UA: POSITIVE
GLUCOSE UA: NEGATIVE
Ketones, UA: NEGATIVE
NITRITE UA: POSITIVE
PROTEIN UA: NEGATIVE
Spec Grav, UA: 1.015 (ref 1.010–1.025)
UROBILINOGEN UA: 0.2 U/dL
pH, UA: 7 (ref 5.0–8.0)

## 2017-12-04 NOTE — Progress Notes (Signed)
Subjective:   Ashlee Lopez is a 84 y.o. female who presents for Medicare Annual (Subsequent) preventive examination.  Review of Systems:  N/A Cardiac Risk Factors include: advanced age (>87men, >17 women);dyslipidemia;hypertension     Objective:     Vitals: BP (!) 166/94 (BP Location: Left Arm, Patient Position: Sitting, Cuff Size: Normal)   Pulse 66   Temp 97.8 F (36.6 C) (Oral)   Ht 5\' 4"  (1.626 m) Comment: no shoes  Wt 142 lb 8 oz (64.6 kg)   SpO2 98%   BMI 24.46 kg/m   Body mass index is 24.46 kg/m.  Advanced Directives 12/04/2017 11/21/2016 09/28/2015  Does Patient Have a Medical Advance Directive? Yes Yes Yes  Type of Paramedic of [place];Living will [place];Living will [place];Living will  Does patient want to make changes to medical advance directive? No - Patient declined - No - Patient declined  Copy of [place] in Chart? No - copy requested No - copy requested No - copy requested    Tobacco Social History   Tobacco Use  Smoking Status Never Smoker  Smokeless Tobacco Never Used     Counseling given: No   Clinical Intake:  Pre-visit preparation completed: Yes  Pain Score: 4      Nutritional Status: BMI of 19-24  Normal Nutritional Risks: None Diabetes: No  How often do you need to have someone help you when you read instructions, pamphlets, or other written materials from your doctor or pharmacy?: 1 - Never What is the last grade level you completed in school?: 12th grade + 1 yr college  Interpreter Needed?: No  Comments: pt lives with spouse  Past Medical History:  Diagnosis Date  . Benign essential HTN 03/21/2011  . Breast cancer, stage 1 ([place]) 02/10/2003   Right tubular breast cancer  . Cancer ([place])   . Diverticulosis of colon 03/23/2011  . Family history of breast cancer   . Family history of colon cancer   . Family history of melanoma   . Family  history of prostate cancer   . Fibrocystic disease of breast 03/23/2011  . Graves' disease with exophthalmos 03/23/2011  . Hypertension   . Hypothyroid 03/21/2011  . IBS (irritable bowel syndrome) 03/23/2011  . ITP (idiopathic thrombocytopenic purpura) 03/21/2011  . S/P splenectomy 03/21/2011  . 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 Varicose vein of leg 03/23/2011   Past Surgical History:  Procedure Laterality Date  . ABDOMINAL HYSTERECTOMY  2001  . APPENDECTOMY  1941  . BREAST SURGERY  02/17/2003   Mastectomy-Right  . MASTECTOMY  2004   Dr Margot Chimes  . OOPHORECTOMY     BSO  . SPLENECTOMY  1986   Family History  Problem Relation Age of Onset  . Breast cancer Mother        Age 38  . Hypertension Father   . Heart disease Father   . Lung cancer Father   . Breast cancer Sister        Age 26  . Melanoma Sister        dx in her 96s  . Dementia Sister   . Heart disease Maternal Aunt   . Prostate cancer Maternal Uncle   . Colon cancer Maternal Grandfather   . Colon cancer Maternal Uncle   . Prostate cancer Maternal Uncle   . Dementia Maternal Aunt   . Breast cancer Cousin  maternal 2nd cousin  . Breast cancer Cousin        maternal first cousin dx in her 73s  . Melanoma Son        dx in his 28s   Social History   Socioeconomic History  . Marital status: Widowed    Spouse name: Not on file  . Number of children: Not on file  . Years of education: Not on file  . Highest education level: Not on file  Occupational History  . Not on file  Social Needs  . Financial resource strain: Not on file  . Food insecurity:    Worry: Not on file    Inability: Not on file  . Transportation needs:    Medical: Not on file    Non-medical: Not on file  Tobacco Use  . Smoking status: Never Smoker  . Smokeless tobacco: Never Used  Substance and Sexual Activity  . Alcohol use: No  . Drug use: No  . Sexual activity: Never     Birth control/protection: Surgical  Lifestyle  . Physical activity:    Days per week: Not on file    Minutes per session: Not on file  . Stress: Not on file  Relationships  . Social connections:    Talks on phone: Not on file    Gets together: Not on file    Attends religious service: Not on file    Active member of club or organization: Not on file    Attends meetings of clubs or organizations: Not on file    Relationship status: Not on file  Other Topics Concern  . Not on file  Social History Narrative   Widow. Lives alone.    3 children, 5 grandchildren.   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   Enjoys reading, watching TV.    Outpatient Encounter Medications as of 12/04/2017  Medication Sig  . ALPRAZolam (XANAX) 0.5 MG tablet TAKE 1/2 TABLET (0.25 MG) BY MOUTH ONCE DAILY AS NEEDED FOR ANXIETY. USE SPARINGLY.  Marland Kitchen amLODipine-benazepril (LOTREL) 5-20 MG capsule Take 1 capsule by mouth once daily for high blood pressure.  Marland Kitchen aspirin 81 MG tablet Take 81 mg by mouth daily.    Marland Kitchen CALCIUM-VITAMIN D PO Take by mouth.    . citalopram (CELEXA) 20 MG tablet Take 1/2 tablet by mouth once daily for anxiety.  Marland Kitchen CRANBERRY PO Take 3 tablets by mouth daily.  . Diphenhydramine-APAP, sleep, (TYLENOL PM EXTRA STRENGTH PO) Take by mouth.    . hydrochlorothiazide (HYDRODIURIL) 25 MG tablet TAKE 1 TABLET BY MOUTH ONCE A DAY  . levothyroxine (SYNTHROID, LEVOTHROID) 100 MCG tablet TAKE 1 TABLET BY MOUTH ONCE A DAY  . metoprolol tartrate (LOPRESSOR) 50 MG tablet TAKE 1 TABLET BY MOUTH TWICE A DAY  . Multiple Vitamin (MULTI-VITAMIN PO) Take by mouth.    . ranitidine (ZANTAC) 150 MG tablet TAKE 1 TABLET BY MOUTH ONCE A DAY  . rosuvastatin (CRESTOR) 5 MG tablet TAKE 1 TABLET BY MOUTH EVERY OTHER DAY  . [DISCONTINUED] doxycycline (VIBRAMYCIN) 100 MG capsule Take 1 capsule (100 mg total) by mouth every 12 (twelve) hours.   No facility-administered encounter medications on file as of 12/04/2017.      Activities of Daily Living In your present state of health, do you have any difficulty performing the following activities: 12/04/2017  Hearing? N  Vision? N  Difficulty concentrating or making decisions? N  Walking or climbing stairs? N  Dressing or bathing? N  Doing errands, shopping? N  Preparing Food and eating ? N  Using the Toilet? N  In the past six months, have you accidently leaked urine? N  Do you have problems with loss of bowel control? N  Managing your Medications? N  Managing your Finances? N  Housekeeping or managing your Housekeeping? N  Some recent data might be hidden    Patient Care Team: Pleas Koch, NP as PCP - General (Internal Medicine) Annia Belt, MD (Hematology and Oncology) Eula Flax, OD as Referring Physician (Optometry) Christene Slates, MD as Consulting Physician (Dermatology)    Assessment:   This is a routine wellness examination for Kellyn.   Hearing Screening   125Hz  250Hz  500Hz  1000Hz  2000Hz  3000Hz  4000Hz  6000Hz  8000Hz   Right ear:   40 40 40  0    Left ear:   0 40 40  0    Vision Screening Comments: Vision exam in Oct 2018 with Dr. Eula Flax    Exercise Activities and Dietary recommendations Current Exercise Habits: Home exercise routine, Type of exercise: strength training/weights, Time (Minutes): 20, Frequency (Times/Week): 7, Weekly Exercise (Minutes/Week): 140, Intensity: Mild, Exercise limited by: None identified  Goals    . Increase water intake     Starting 12/04/2017, I will continue to drink at least 6-8 glasses of water daily.        Fall Risk Fall Risk  12/04/2017 11/21/2016 09/28/2015  Falls in the past year? Yes Yes Yes  Comment fell backwards while doing vacuuming; denies injury  - fell while waking down a step at a restaurant  Number falls in past yr: 1 2 or more 1  Injury with Fall? No Yes Yes  Follow up - - Falls evaluation completed   Depression Screen PHQ 2/9 Scores 12/04/2017 11/21/2016  09/28/2015  PHQ - 2 Score 0 2 0  PHQ- 9 Score 0 4 -     Cognitive Function MMSE - Mini Mental State Exam 12/04/2017 11/21/2016 09/28/2015  Orientation to time 5 5 5   Orientation to Place 5 5 5   Registration 3 3 3   Attention/ Calculation 0 0 0  Recall 3 3 3   Language- name 2 objects 0 0 0  Language- repeat 1 1 1   Language- follow 3 step command 3 3 3   Language- read & follow direction 0 0 0  Write a sentence 0 0 0  Copy design 0 0 0  Total score 20 20 20      PLEASE NOTE: A Mini-Cog screen was completed. Maximum score is 20. A value of 0 denotes this part of [place] MMSE was not completed or the patient failed this part of the Mini-Cog screening.   Mini-Cog Screening Orientation to Time - Max 5 pts Orientation to Place - Max 5 pts Registration - Max 3 pts Recall - Max 3 pts Language Repeat - Max 1 pts Language Follow 3 Step Command - Max 3 pts     Immunization History  Administered Date(s) Administered  . Influenza,inj,Quad PF,6+ Mos 12/05/2016, 12/04/2017  . Influenza-Unspecified 12/19/2015  . Pneumococcal Conjugate-13 09/28/2015  . Pneumococcal Polysaccharide-23 03/23/2011   Screening Tests Health Maintenance  Topic Date Due  . PAP SMEAR  09/27/2048 (Originally 07/10/2012)  . TETANUS/TDAP  07/02/2020  . INFLUENZA VACCINE  Completed  . DEXA SCAN  Completed  . PNA vac Low Risk Adult  Completed       Plan:     I have personally reviewed, addressed, and noted the following in the patient's chart:  A. Medical  and social history B. Use of alcohol, tobacco or illicit drugs  C. Current medications and supplements D. Functional ability and status E.  Nutritional status F.  Physical activity G. Advance directives H. List of other physicians I.  Hospitalizations, surgeries, and ER visits in previous 12 months J.  [place] to include hearing, vision, cognitive, depression L. Referrals and appointments - none  In addition, I have reviewed and discussed with  patient certain preventive protocols, quality metrics, and best practice recommendations. A written personalized care plan for preventive services as well as general preventive health recommendations were provided to patient.  See attached scanned questionnaire for additional information.   Signed,   Lindell Noe, MHA, BS, LPN Health Coach

## 2017-12-04 NOTE — Patient Instructions (Signed)
Ms. Butchko , Thank you for taking time to come for your Medicare Wellness Visit. I appreciate your ongoing commitment to your health goals. Please review the following plan we discussed and let me know if I can assist you in the future.   These are the goals we discussed: Goals    . Increase water intake     Starting 12/04/2017, I will continue to drink at least 6-8 glasses of water daily.        This is a list of the screening recommended for you and due dates:  Health Maintenance  Topic Date Due  . Pap Smear  09/27/2048*  . Tetanus Vaccine  07/02/2020  . Flu Shot  Completed  . DEXA scan (bone density measurement)  Completed  . Pneumonia vaccines  Completed  *Topic was postponed. The date shown is not the original due date.   Preventive Care for Adults  A healthy lifestyle and preventive care can promote health and wellness. Preventive health guidelines for adults include the following key practices.  . A routine yearly physical is a good way to check with your health care provider about your health and preventive screening. It is a chance to share any concerns and updates on your health and to receive a thorough exam.  . Visit your dentist for a routine exam and preventive care every 6 months. Brush your teeth twice a day and floss once a day. Good oral hygiene prevents tooth decay and gum disease.  . The frequency of eye exams is based on your age, health, family medical history, use  of contact lenses, and other factors. Follow your health care provider's recommendations for frequency of eye exams.  . Eat a healthy diet. Foods like vegetables, fruits, whole grains, low-fat dairy products, and lean protein foods contain the nutrients you need without too many calories. Decrease your intake of foods high in solid fats, added sugars, and salt. Eat the right amount of calories for you. Get information about a proper diet from your health care provider, if necessary.  . Regular  physical exercise is one of the most important things you can do for your health. Most adults should get at least 150 minutes of moderate-intensity exercise (any activity that increases your heart rate and causes you to sweat) each week. In addition, most adults need muscle-strengthening exercises on 2 or more days a week.  Silver Sneakers may be a benefit available to you. To determine eligibility, you may visit the website: www.silversneakers.com or contact program at 7376618064 Mon-Fri between 8AM-8PM.   . Maintain a healthy weight. The body mass index (BMI) is a screening tool to identify possible weight problems. It provides an estimate of body fat based on height and weight. Your health care provider can find your BMI and can help you achieve or maintain a healthy weight.   For adults 20 years and older: ? A BMI below 18.5 is considered underweight. ? A BMI of 18.5 to 24.9 is normal. ? A BMI of 25 to 29.9 is considered overweight. ? A BMI of 30 and above is considered obese.   . Maintain normal blood lipids and cholesterol levels by exercising and minimizing your intake of saturated fat. Eat a balanced diet with plenty of fruit and vegetables. Blood tests for lipids and cholesterol should begin at age 59 and be repeated every 5 years. If your lipid or cholesterol levels are high, you are over 50, or you are at high risk for heart disease,  you may need your cholesterol levels checked more frequently. Ongoing high lipid and cholesterol levels should be treated with medicines if diet and exercise are not working.  . If you smoke, find out from your health care provider how to quit. If you do not use tobacco, please do not start.  . If you choose to drink alcohol, please do not consume more than 2 drinks per day. One drink is considered to be 12 ounces (355 mL) of beer, 5 ounces (148 mL) of wine, or 1.5 ounces (44 mL) of liquor.  . If you are 55-56 years old, ask your health care provider if  you should take aspirin to prevent strokes.  . Use sunscreen. Apply sunscreen liberally and repeatedly throughout the day. You should seek shade when your shadow is shorter than you. Protect yourself by wearing long sleeves, pants, a wide-brimmed hat, and sunglasses year round, whenever you are outdoors.  . Once a month, do a whole body skin exam, using a mirror to look at the skin on your back. Tell your health care provider of new moles, moles that have irregular borders, moles that are larger than a pencil eraser, or moles that have changed in shape or color.

## 2017-12-05 ENCOUNTER — Encounter: Payer: Self-pay | Admitting: Primary Care

## 2017-12-05 ENCOUNTER — Ambulatory Visit (INDEPENDENT_AMBULATORY_CARE_PROVIDER_SITE_OTHER): Payer: Medicare Other | Admitting: Primary Care

## 2017-12-05 VITALS — BP 148/84 | HR 76 | Temp 98.0°F | Ht 64.0 in | Wt 142.5 lb

## 2017-12-05 DIAGNOSIS — N390 Urinary tract infection, site not specified: Secondary | ICD-10-CM | POA: Diagnosis not present

## 2017-12-05 DIAGNOSIS — N3001 Acute cystitis with hematuria: Secondary | ICD-10-CM

## 2017-12-05 LAB — COMPREHENSIVE METABOLIC PANEL
A/G RATIO: 1.8 (ref 1.2–2.2)
ALBUMIN: 4.3 g/dL (ref 3.5–4.7)
ALK PHOS: 69 IU/L (ref 39–117)
ALT: 18 IU/L (ref 0–32)
AST: 28 IU/L (ref 0–40)
BUN/Creatinine Ratio: 17 (ref 12–28)
BUN: 11 mg/dL (ref 8–27)
Bilirubin Total: 0.7 mg/dL (ref 0.0–1.2)
CHLORIDE: 93 mmol/L — AB (ref 96–106)
CO2: 26 mmol/L (ref 20–29)
Calcium: 9.8 mg/dL (ref 8.7–10.3)
Creatinine, Ser: 0.66 mg/dL (ref 0.57–1.00)
GFR calc Af Amer: 95 mL/min/{1.73_m2} (ref >59)
GFR calc non Af Amer: 83 mL/min/{1.73_m2} (ref >59)
GLUCOSE: 102 mg/dL — AB (ref 65–99)
Globulin, Total: 2.4 g/dL (ref 1.5–4.5)
Potassium: 4.1 mmol/L (ref 3.5–5.2)
Sodium: 134 mmol/L (ref 134–144)
Total Protein: 6.7 g/dL (ref 6.0–8.5)

## 2017-12-05 LAB — LIPID PANEL
CHOL/HDL RATIO: 2.6 ratio (ref 0.0–4.4)
Cholesterol, Total: 160 mg/dL (ref 100–199)
HDL: 61 mg/dL (ref >39)
LDL Calculated: 83 mg/dL (ref 0–99)
TRIGLYCERIDES: 79 mg/dL (ref 0–149)
VLDL CHOLESTEROL CAL: 16 mg/dL (ref 5–40)

## 2017-12-05 LAB — TSH: TSH: 2.01 u[IU]/mL (ref 0.450–4.500)

## 2017-12-05 LAB — HEMOGLOBIN A1C
ESTIMATED AVERAGE GLUCOSE: 114 mg/dL
Hgb A1c MFr Bld: 5.6 % (ref 4.8–5.6)

## 2017-12-05 MED ORDER — CEPHALEXIN 500 MG PO CAPS: 500.0000 mg | ORAL_CAPSULE | Freq: Two times a day (BID) | ORAL | 0 refills | Status: DC

## 2017-12-05 NOTE — Patient Instructions (Signed)
Start Cephalexin antibiotics for the infection. Take 1 capsule by mouth twice daily for 7 days.  Make sure to drink plenty of water.  It was a pleasure to see you today!

## 2017-12-05 NOTE — Assessment & Plan Note (Addendum)
UTI in April 2019, July 2019, and again now.  Prior UTI's E coli positive. Will treat with Cephalexin course BID x 7 days. Recommended she schedule a follow up visit with her Urologist given continued UTI's.   She appears well and is stable for outpatient treatment.

## 2017-12-05 NOTE — Progress Notes (Signed)
Subjective:    Patient ID: Ashlee Lopez, female    DOB: 12/12/1933, 84 y.o.   MRN: [id number]  HPI  Ashlee Lopez is an 84 year old female with a history of recurrent UTI who presents today with a chief complaint of urinary frequency.  She reported her symptoms to our health advisor nurse yesterday during her Medicare Wellness Visit who ordered a urinalysis. She felt like she had a UTI. Her urinalysis showed 3+ leuks, positive nitrites, positive blood. Urine culture is pending.  She is following with Urology. Her last UTI was in July 2019, E coli positive from culture and treated with Macrobid 100 mg BID x 7 days. Prior UTI in April 2019, urine culture was positive for E coli.   She's not taken anything OTC for symptoms except for her daily cranberry tablets. She denies abdominal pain, hematuria, dysuria, fevers, nausea.   Review of Systems  Constitutional: Negative for fever.  Cardiovascular: Negative for palpitations.  Gastrointestinal: Negative for abdominal pain and nausea.  Genitourinary: Positive for frequency. Negative for dysuria and hematuria.       Past Medical History:  Diagnosis Date  . Benign essential HTN 03/21/2011  . Breast cancer, stage 1 ([place]) 02/10/2003   Right tubular breast cancer  . Cancer ([place])   . Diverticulosis of colon 03/23/2011  . Family history of breast cancer   . Family history of colon cancer   . Family history of melanoma   . Family history of prostate cancer   . Fibrocystic disease of breast 03/23/2011  . Graves' disease with exophthalmos 03/23/2011  . Hypertension   . Hypothyroid 03/21/2011  . IBS (irritable bowel syndrome) 03/23/2011  . ITP (idiopathic thrombocytopenic purpura) 03/21/2011  . S/P splenectomy 03/21/2011  . Uterus cancer ([place]) 03/23/1999   Well differentiated AdenoCA of endometrium-superficially confined  . Varicose vein of leg 03/23/2011     Social History   Socioeconomic History  . Marital status: Widowed    Spouse name: Not on file  .  Number of children: Not on file  . Years of education: Not on file  . Highest education level: Not on file  Occupational History  . Not on file  Social Needs  . Financial resource strain: Not on file  . Food insecurity:    Worry: Not on file    Inability: Not on file  . Transportation needs:    Medical: Not on file    Non-medical: Not on file  Tobacco Use  . Smoking status: Never Smoker  . Smokeless tobacco: Never Used  Substance and Sexual Activity  . Alcohol use: No  . Drug use: No  . Sexual activity: Never    Birth control/protection: Surgical  Lifestyle  . Physical activity:    Days per week: Not on file    Minutes per session: Not on file  . Stress: Not on file  Relationships  . Social connections:    Talks on phone: Not on file    Gets together: Not on file    Attends religious service: Not on file    Active member of club or organization: Not on file    Attends meetings of clubs or organizations: Not on file    Relationship status: Not on file  . Intimate partner violence:    Fear of current or ex partner: Not on file    Emotionally abused: Not on file    Physically abused: Not on file    Forced sexual activity: Not on  file  Other Topics Concern  . Not on file  Social History Narrative   Widow. Lives alone.    3 children, 5 grandchildren.   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   Enjoys reading, watching TV.    Past Surgical History:  Procedure Laterality Date  . ABDOMINAL HYSTERECTOMY  2001  . APPENDECTOMY  1941  . BREAST SURGERY  02/17/2003   Mastectomy-Right  . MASTECTOMY  2004   Dr Margot Chimes  . OOPHORECTOMY     BSO  . SKIN CANCER EXCISION  2019   removal of cancer from ear  . SPLENECTOMY  1986    Family History  Problem Relation Age of Onset  . Breast cancer Mother        Age 67  . Hypertension Father   . Heart disease Father   . Lung cancer Father   . Breast cancer Sister        Age 77  . Melanoma Sister        dx in her 35s  . Dementia  Sister   . Heart disease Maternal Aunt   . Prostate cancer Maternal Uncle   . Colon cancer Maternal Grandfather   . Colon cancer Maternal Uncle   . Prostate cancer Maternal Uncle   . Dementia Maternal Aunt   . Breast cancer Cousin        maternal 2nd cousin  . Breast cancer Cousin        maternal first cousin dx in her 79s  . Melanoma Son        dx in his 19s    Allergies  Allergen Reactions  . Sulfamethoxazole-Trimethoprim Hives  . Septra [Bactrim]     Current Outpatient Medications on File Prior to Visit  Medication Sig Dispense Refill  . ALPRAZolam (XANAX) 0.5 MG tablet TAKE 1/2 TABLET (0.25 MG) BY MOUTH ONCE DAILY AS NEEDED FOR ANXIETY. USE SPARINGLY. 30 tablet 0  . amLODipine-benazepril (LOTREL) 5-20 MG capsule Take 1 capsule by mouth once daily for high blood pressure. 90 capsule 2  . aspirin 81 MG tablet Take 81 mg by mouth daily.      Marland Kitchen CALCIUM-VITAMIN D PO Take by mouth.      . citalopram (CELEXA) 20 MG tablet Take 1/2 tablet by mouth once daily for anxiety. 90 tablet 0  . CRANBERRY PO Take 3 tablets by mouth daily.    . Diphenhydramine-APAP, sleep, (TYLENOL PM EXTRA STRENGTH PO) Take by mouth.      . hydrochlorothiazide (HYDRODIURIL) 25 MG tablet TAKE 1 TABLET BY MOUTH ONCE A DAY 90 tablet 1  . levothyroxine (SYNTHROID, LEVOTHROID) 100 MCG tablet TAKE 1 TABLET BY MOUTH ONCE A DAY 90 tablet 1  . metoprolol tartrate (LOPRESSOR) 50 MG tablet TAKE 1 TABLET BY MOUTH TWICE A DAY 180 tablet 1  . Multiple Vitamin (MULTI-VITAMIN PO) Take by mouth.      . ranitidine (ZANTAC) 150 MG tablet TAKE 1 TABLET BY MOUTH ONCE A DAY 90 tablet 1  . rosuvastatin (CRESTOR) 5 MG tablet TAKE 1 TABLET BY MOUTH EVERY OTHER DAY 90 tablet 0   No current facility-administered medications on file prior to visit.     BP (!) 148/84   Pulse 76   Temp 98 F (36.7 C) (Oral)   Ht 5\' 4"  (1.626 m)   Wt 142 lb 8 oz (64.6 kg)   SpO2 97%   BMI 24.46 kg/m    Objective:   Physical Exam    Constitutional: She appears well-nourished.  Neck: Neck supple.  Cardiovascular: Normal rate and regular rhythm.  Respiratory: Effort normal and breath sounds normal.  GI: Soft. Bowel sounds are normal. There is no tenderness. There is no CVA tenderness.  Skin: Skin is warm and dry.           Assessment & Plan:

## 2017-12-06 DIAGNOSIS — L821 Other seborrheic keratosis: Secondary | ICD-10-CM | POA: Diagnosis not present

## 2017-12-06 DIAGNOSIS — Z85828 Personal history of other malignant neoplasm of skin: Secondary | ICD-10-CM | POA: Diagnosis not present

## 2017-12-06 DIAGNOSIS — L820 Inflamed seborrheic keratosis: Secondary | ICD-10-CM | POA: Diagnosis not present

## 2017-12-08 NOTE — Progress Notes (Signed)
PCP notes:   Health maintenance:  Flu vaccine - administered  Abnormal screenings:   Hearing - failed  Hearing Screening   125Hz  250Hz  500Hz  1000Hz  2000Hz  3000Hz  4000Hz  6000Hz  8000Hz   Right ear:   40 40 40  0    Left ear:   0 40 40  0     Fall risk - hx of single fall Fall Risk  12/04/2017 11/21/2016 09/28/2015  Falls in the past year? Yes Yes Yes  Comment fell backwards while doing vacuuming; denies injury  - fell while waking down a step at a restaurant  Number falls in past yr: 1 2 or more 1  Injury with Fall? No Yes Yes  Follow up - - Falls evaluation completed   Patient concerns:   Patient reports burning with urination. PCP consulted. UA and CS ordered.   Nurse concerns:  None  Next PCP appt:   12/11/17 @ 0820

## 2017-12-09 ENCOUNTER — Ambulatory Visit: Payer: Self-pay | Admitting: Primary Care

## 2017-12-09 LAB — URINE CULTURE

## 2017-12-11 ENCOUNTER — Ambulatory Visit: Payer: Medicare Other | Admitting: Primary Care

## 2017-12-11 ENCOUNTER — Ambulatory Visit (INDEPENDENT_AMBULATORY_CARE_PROVIDER_SITE_OTHER): Payer: Medicare Other | Admitting: Primary Care

## 2017-12-11 ENCOUNTER — Encounter: Payer: Self-pay | Admitting: Primary Care

## 2017-12-11 VITALS — BP 144/86 | HR 76 | Temp 98.0°F | Ht 64.0 in | Wt 143.2 lb

## 2017-12-11 DIAGNOSIS — C50912 Malignant neoplasm of unspecified site of left female breast: Secondary | ICD-10-CM

## 2017-12-11 DIAGNOSIS — E039 Hypothyroidism, unspecified: Secondary | ICD-10-CM | POA: Diagnosis not present

## 2017-12-11 DIAGNOSIS — N390 Urinary tract infection, site not specified: Secondary | ICD-10-CM

## 2017-12-11 DIAGNOSIS — F411 Generalized anxiety disorder: Secondary | ICD-10-CM

## 2017-12-11 DIAGNOSIS — I10 Essential (primary) hypertension: Secondary | ICD-10-CM | POA: Diagnosis not present

## 2017-12-11 DIAGNOSIS — R05 Cough: Secondary | ICD-10-CM

## 2017-12-11 DIAGNOSIS — R053 Chronic cough: Secondary | ICD-10-CM

## 2017-12-11 DIAGNOSIS — E785 Hyperlipidemia, unspecified: Secondary | ICD-10-CM

## 2017-12-11 MED ORDER — ALPRAZOLAM 0.5 MG PO TABS: ORAL_TABLET | ORAL | 0 refills | Status: DC

## 2017-12-11 MED ORDER — CITALOPRAM HYDROBROMIDE 20 MG PO TABS: ORAL_TABLET | ORAL | 3 refills | Status: DC

## 2017-12-11 MED ORDER — LOSARTAN POTASSIUM 100 MG PO TABS: ORAL_TABLET | ORAL | 0 refills | Status: DC

## 2017-12-11 NOTE — Assessment & Plan Note (Signed)
Recent lipid panel stable, continue rosuvastatin. 

## 2017-12-11 NOTE — Assessment & Plan Note (Signed)
Repeat mammogram due in February 2020.

## 2017-12-11 NOTE — Patient Instructions (Addendum)
Stop amlodipine-benazepril 5-20 mg for blood pressure. Start losartan 100 mg once daily for blood pressure. Continue metoprolol tartrate 50 mg twice daily and hydrochlorothiazide 25 mg once daily for blood pressure.  Monitor your blood pressure at home and call me if you see readings at or above 150/90, and/or you feel dizzy or weak. Please also call me if your cough persists.   Continue to reduce use of your alprazolam (Xanax). Only use it when needed. Continue taking citalopram everyday.  Call your orthopedist as discussed.  It was a pleasure to see you today!

## 2017-12-11 NOTE — Assessment & Plan Note (Signed)
Recent TSH unremarkable. Continue levothyroxine 100 mcg. She is taking appropriately.

## 2017-12-11 NOTE — Assessment & Plan Note (Signed)
She doesn't plan on seeing Urology anytime soon. Encouraged her to schedule an appointment. Will continue to monitor.

## 2017-12-11 NOTE — Assessment & Plan Note (Addendum)
Suspect this is secondary to ACE. Will stop amlodipine-benazepril. Start losartan. She will update. Consider PPI if symptoms persist.

## 2017-12-11 NOTE — Assessment & Plan Note (Signed)
Overall seems improved with initiation of citalopram 10 mg. Will continue to gradually wean her off of alprazolam as she is now down to 1/2 tablet once daily. Encouraged her to only use PRN. Continue to monitor.

## 2017-12-11 NOTE — Assessment & Plan Note (Signed)
Suspect benazepril to be causing cough. Stop amlodipine-benazepril. Start losartan 100 mg. Continue HCTZ and metoprolol tartrate.  She will monitor blood pressure and report readings in 2 weeks. We will call her if we don't hear back.  BMP unremarkable.

## 2017-12-11 NOTE — Progress Notes (Signed)
I reviewed health advisor's note, was available for consultation, and agree with documentation and plan.  

## 2017-12-11 NOTE — Progress Notes (Signed)
Subjective:    Patient ID: Ashlee Lopez, female    DOB: 1933-10-29, 84 y.o.   MRN: [id number]  HPI  Ashlee Lopez is an 84 year old female who presents today for [place] Part 2. She saw our health advisor last week.   Immunizations: -Influenza: Completed -Pneumonia: Completed last in 2017 -Shingles: Never completed. Declines.   Colonoscopy: Completed in 2014 Dexa: Completed in 2019. She is compliant to calcium and vitamin D. Mammogram: Completed in 2019, February. [facility].    1) GAD: Currently managed on citalopram 20 mg daily and alprazolam 0.5 mg for which she takes daily, and has taken daily for years. We've had numerous discussions in the past with a goal to wean her off of alprazolam given the addictive nature of the medication and risk for falls.   She is taking 1/2 tablet most days of the week, sometimes one full tablet. She is helping to care for her sister who has advanced dementia. She is taking 1/2 tablet of her citalopram daily and believes this has helped with overall anxiety.   2) Recurrent UTI: Recent infection being one week ago, culture positive for Klebsiella pneumoniae, treated with cephalexin. Following with Urology with last visit being in December 2018. She has not notified their office of the recurrent infection and doesn't plan to return.   3) Hypothyroidism: Currently managed on levothyroxine 100 mcg. Recent TSH of 2.010. She's taking her levothyroxine every morning with water, doesn't eat or take other medications for 30 minutes.   4) Essential Hypertension: Currently managed on amlodipine-benazepril 5-20 mg, metoprolol tartrate 50 mg BID, and HCTZ 25 mg. She denies dizziness, chest pain, shortness of breath. She has a history of chronic cough, worse recently and describes as a tickle to her throat.  BP Readings from Last 3 Encounters:  12/11/17 (!) 144/86  12/05/17 (!) 148/84  12/04/17 (!) 166/94   5) Chronic Cough: Present for years, intermittent. Feels like a  tickle in her throat. She does occasionally notice some clear mucous. She denies fevers, chills. She is managed on amlodipine-benazepril 5-20 mg once daily, thinks it became worse after she started this medication.   Review of Systems  Constitutional: Negative for fatigue and fever.  Respiratory: Positive for cough. Negative for shortness of breath.   Cardiovascular: Negative for chest pain.  Genitourinary: Negative for dysuria, flank pain, frequency and vaginal discharge.  Neurological: Negative for dizziness and headaches.  Psychiatric/Behavioral:       See HPI       Past Medical History:  Diagnosis Date  . Benign essential HTN 03/21/2011  . Breast cancer, stage 1 ([place]) 02/10/2003   Right tubular breast cancer  . Cancer ([place])   . Diverticulosis of colon 03/23/2011  . Family history of breast cancer   . Family history of colon cancer   . Family history of melanoma   . Family history of prostate cancer   . Fibrocystic disease of breast 03/23/2011  . Graves' disease with exophthalmos 03/23/2011  . Hypertension   . Hypothyroid 03/21/2011  . IBS (irritable bowel syndrome) 03/23/2011  . ITP (idiopathic thrombocytopenic purpura) 03/21/2011  . S/P splenectomy 03/21/2011  . Uterus cancer ([place]) 03/23/1999   Well differentiated AdenoCA of endometrium-superficially confined  . Varicose vein of leg 03/23/2011     Social History   Socioeconomic History  . Marital status: Widowed    Spouse name: Not on file  . Number of children: Not on file  . Years of education: Not on  file  . Highest education level: Not on file  Occupational History  . Not on file  Social Needs  . Financial resource strain: Not on file  . Food insecurity:    Worry: Not on file    Inability: Not on file  . Transportation needs:    Medical: Not on file    Non-medical: Not on file  Tobacco Use  . Smoking status: Never Smoker  . Smokeless tobacco: Never Used  Substance and Sexual Activity  . Alcohol use: No  . Drug use: No    . Sexual activity: Never    Birth control/protection: Surgical  Lifestyle  . Physical activity:    Days per week: Not on file    Minutes per session: Not on file  . Stress: Not on file  Relationships  . Social connections:    Talks on phone: Not on file    Gets together: Not on file    Attends religious service: Not on file    Active member of club or organization: Not on file    Attends meetings of clubs or organizations: Not on file    Relationship status: Not on file  . Intimate partner violence:    Fear of current or ex partner: Not on file    Emotionally abused: Not on file    Physically abused: Not on file    Forced sexual activity: Not on file  Other Topics Concern  . Not on file  Social History Narrative   Widow. Lives alone.    3 children, 5 grandchildren.   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   Enjoys reading, watching TV.    Past Surgical History:  Procedure Laterality Date  . ABDOMINAL HYSTERECTOMY  2001  . APPENDECTOMY  1941  . BREAST SURGERY  02/17/2003   Mastectomy-Right  . MASTECTOMY  2004   Dr Margot Chimes  . OOPHORECTOMY     BSO  . SKIN CANCER EXCISION  2019   removal of cancer from ear  . SPLENECTOMY  1986    Family History  Problem Relation Age of Onset  . Breast cancer Mother        Age 45  . Hypertension Father   . Heart disease Father   . Lung cancer Father   . Breast cancer Sister        Age 87  . Melanoma Sister        dx in her 44s  . Dementia Sister   . Heart disease Maternal Aunt   . Prostate cancer Maternal Uncle   . Colon cancer Maternal Grandfather   . Colon cancer Maternal Uncle   . Prostate cancer Maternal Uncle   . Dementia Maternal Aunt   . Breast cancer Cousin        maternal 2nd cousin  . Breast cancer Cousin        maternal first cousin dx in her 56s  . Melanoma Son        dx in his 17s    Allergies  Allergen Reactions  . Sulfamethoxazole-Trimethoprim Hives  . Septra [Bactrim]     Current Outpatient  Medications on File Prior to Visit  Medication Sig Dispense Refill  . aspirin 81 MG tablet Take 81 mg by mouth daily.      Marland Kitchen CALCIUM-VITAMIN D PO Take by mouth.      . cephALEXin (KEFLEX) 500 MG capsule Take 1 capsule (500 mg total) by mouth 2 (two) times daily. 14 capsule 0  . CRANBERRY PO  Take 3 tablets by mouth daily.    . Diphenhydramine-APAP, sleep, (TYLENOL PM EXTRA STRENGTH PO) Take by mouth.      . hydrochlorothiazide (HYDRODIURIL) 25 MG tablet TAKE 1 TABLET BY MOUTH ONCE A DAY 90 tablet 1  . levothyroxine (SYNTHROID, LEVOTHROID) 100 MCG tablet TAKE 1 TABLET BY MOUTH ONCE A DAY 90 tablet 1  . metoprolol tartrate (LOPRESSOR) 50 MG tablet TAKE 1 TABLET BY MOUTH TWICE A DAY 180 tablet 1  . Multiple Vitamin (MULTI-VITAMIN PO) Take by mouth.      . ranitidine (ZANTAC) 150 MG tablet TAKE 1 TABLET BY MOUTH ONCE A DAY 90 tablet 1  . rosuvastatin (CRESTOR) 5 MG tablet TAKE 1 TABLET BY MOUTH EVERY OTHER DAY 90 tablet 0   No current facility-administered medications on file prior to visit.     BP (!) 144/86   Pulse 76   Temp 98 F (36.7 C) (Oral)   Ht 5\' 4"  (1.626 m)   Wt 143 lb 4 oz (65 kg)   SpO2 97%   BMI 24.59 kg/m    Objective:   Physical Exam  Constitutional: She is oriented to person, place, and time. She appears well-nourished.  HENT:  Mouth/Throat: No oropharyngeal exudate.  Eyes: Pupils are equal, round, and reactive to light. EOM are normal.  Neck: Neck supple. No thyromegaly present.  Cardiovascular: Normal rate and regular rhythm.  Respiratory: Effort normal and breath sounds normal.  GI: Soft. Bowel sounds are normal. There is no tenderness.  Musculoskeletal: Normal range of motion.  Neurological: She is alert and oriented to person, place, and time.  Skin: Skin is warm and dry.  Psychiatric: She has a normal mood and affect.           Assessment & Plan:

## 2017-12-20 ENCOUNTER — Ambulatory Visit: Payer: Self-pay

## 2017-12-20 NOTE — Telephone Encounter (Signed)
Spoke with Ashlee Lopez.  She denies andy CP, SOB or Stroke symptoms.   She just states she hasn't felt good since starting the Losartan.  She states the tickle was not that bothersome and is agreeable to restarting the amolodopine-benazepril 5-20 mg.  She states she has already taken the losartan today so I advised that she re-start the amlodopine-benazapril tomorrow.  She still has some pills left so she does not need a refill at this time.  Medication list updated.

## 2017-12-20 NOTE — Telephone Encounter (Signed)
Reviewed last OV note . BP had been well controlled for age. Amlodipine/benazepril was stopped given tickle in throat.. changed to losartan. Call pt: Verify AGAIN  no chest pain, SOB or s/s of stroke ( slurred speech, facial droop specific new  Weakness/numbness, confusion etc), .  PEC note # 6 does not specify NO to CP, SOB etc. If yes.. Go to ER.  If she does not like the SE of losartan and tickle in throat was not too bothersome... Return to previous regimen as follows -benazepril 5-20 mg daily, metoprolol tartrate 50 mg BID, and HCTZ 25 mg  If she does not want to restart previous regimen, we will have to try a different med.

## 2017-12-20 NOTE — Addendum Note (Signed)
Addended by: Carter Kitten on: 12/20/2017 11:25 AM   Modules accepted: Orders

## 2017-12-20 NOTE — Telephone Encounter (Signed)
Patient called in with c/o "elevated BP." She says "I was in the office on 12/11/17 and Anda Kraft stopped my amlodipine and started me on losartan. Every since I started taking it, I just haven't felt quite right. I check my BP once a week and today around 0930 it was 184/103. I'm feeling nervous about the BP, I don't think that medicine is working. I took all my medicines a little before 0900." I asked has she missed any doses, she denies. I asked about other symptoms, she says "my head feels funny, like pressure, no dizziness, no blurred vision." According to protocol, see PCP within 24 hours, Anda Kraft is not in the office today. The patient says "I don't want to come to the office unless it's necessary. Just have someone call me back and let me know what to do." I advised I will send this to Dr. Diona Browner for her recommendation and someone will call back with that, care advice given, patient verbalized understanding.  Reason for Disposition . Systolic BP  >= 336 OR Diastolic >= 122  Answer Assessment - Initial Assessment Questions 1. BLOOD PRESSURE: "What is the blood pressure?" "Did you take at least two measurements 5 minutes apart?"     184/103 about 10 minutes ago 2. ONSET: "When did you take your blood pressure?"     0930 3. HOW: "How did you obtain the blood pressure?" (e.g., visiting nurse, automatic home BP monitor)    Automatic home BP monitor 4. HISTORY: "Do you have a history of high blood pressure?"     Yes 5. MEDICATIONS: "Are you taking any medications for blood pressure?" "Have you missed any doses recently?"     Yes, haven't missed doses 6. OTHER SYMPTOMS: "Do you have any symptoms?" (e.g., headache, chest pain, blurred vision, difficulty breathing, weakness)     Head feels funny-pressure 7. PREGNANCY: "Is there any chance you are pregnant?" "When was your last menstrual period?"     No  Protocols used: HIGH BLOOD PRESSURE-A-AH

## 2017-12-21 NOTE — Telephone Encounter (Signed)
Noted and agree with the plan.  Vallarie Mare, will you please check on patient next week. Thanks.

## 2017-12-23 NOTE — Telephone Encounter (Signed)
Spoken to patient and she stated that she feels better than last week. She have not check her BP. Also patient thinks that her UTI return so made an appt on 12/24/2017

## 2017-12-24 ENCOUNTER — Ambulatory Visit (INDEPENDENT_AMBULATORY_CARE_PROVIDER_SITE_OTHER): Payer: Medicare Other | Admitting: Primary Care

## 2017-12-24 ENCOUNTER — Encounter: Payer: Self-pay | Admitting: Primary Care

## 2017-12-24 VITALS — BP 158/92 | HR 72 | Temp 98.0°F | Ht 64.0 in | Wt 140.5 lb

## 2017-12-24 DIAGNOSIS — R350 Frequency of micturition: Secondary | ICD-10-CM

## 2017-12-24 DIAGNOSIS — E039 Hypothyroidism, unspecified: Secondary | ICD-10-CM | POA: Diagnosis not present

## 2017-12-24 DIAGNOSIS — N390 Urinary tract infection, site not specified: Secondary | ICD-10-CM | POA: Diagnosis not present

## 2017-12-24 DIAGNOSIS — K219 Gastro-esophageal reflux disease without esophagitis: Secondary | ICD-10-CM | POA: Diagnosis not present

## 2017-12-24 DIAGNOSIS — I10 Essential (primary) hypertension: Secondary | ICD-10-CM | POA: Diagnosis not present

## 2017-12-24 LAB — POC URINALSYSI DIPSTICK (AUTOMATED)
GLUCOSE UA: NEGATIVE
Ketones, UA: NEGATIVE
NITRITE UA: POSITIVE
Protein, UA: POSITIVE — AB
RBC UA: NEGATIVE
Spec Grav, UA: 1.01 (ref 1.010–1.025)
UROBILINOGEN UA: 0.2 U/dL
pH, UA: 8 (ref 5.0–8.0)

## 2017-12-24 MED ORDER — HYDROCHLOROTHIAZIDE 25 MG PO TABS: 25.0000 mg | ORAL_TABLET | Freq: Every day | ORAL | 3 refills | Status: DC

## 2017-12-24 MED ORDER — CIPROFLOXACIN HCL 250 MG PO TABS: 250.0000 mg | ORAL_TABLET | Freq: Two times a day (BID) | ORAL | 0 refills | Status: DC

## 2017-12-24 MED ORDER — LEVOTHYROXINE SODIUM 100 MCG PO TABS: ORAL_TABLET | ORAL | 3 refills | Status: DC

## 2017-12-24 MED ORDER — RANITIDINE HCL 150 MG PO TABS: ORAL_TABLET | ORAL | 1 refills | Status: DC

## 2017-12-24 MED ORDER — AMLODIPINE BESY-BENAZEPRIL HCL 10-20 MG PO CAPS: ORAL_CAPSULE | ORAL | 0 refills | Status: DC

## 2017-12-24 NOTE — Assessment & Plan Note (Signed)
UA today with 3+ leuks, positive nitrites, negative blood culture pending. Rx for Cipro course sent to pharmacy. Strongly advised she schedule a follow up visit with her Urologist, she will call to schedule.

## 2017-12-24 NOTE — Assessment & Plan Note (Signed)
Above goal in the office today, also borderline on prior visits. Increase amlodipine to 10 mg, continue benazepril at 20 mg. Continue other medications as prescribed.  She will phone [place] with BP readings in 2 weeks.

## 2017-12-24 NOTE — Patient Instructions (Addendum)
We've increased the dose of your amlodipine-benazepril to 10 mg-20 mg from 5 mg-20 mg. I sent a new prescription to your pharmacy, take 1 capsule by mouth once daily.  I sent refills for your other medications to your pharmacy.   Continue to monitor your blood pressure at home and call me in 2 weeks with readings.   Start ciprofloxacin antibiotics for the infection. Take 1 tablet by mouth twice daily for 5 days.  Please schedule an appointment with the Urologist as discussed.  It was a pleasure to see you today!

## 2017-12-24 NOTE — Progress Notes (Signed)
Subjective:    Patient ID: Ashlee Lopez, female    DOB: Mar 08, 1934, 84 y.o.   MRN: [id number]  HPI  Ashlee Lopez is an 84 year old female with a history of recurrent UTI, uterine cancer, breast cancer who presents today with a chief complaint of urinary frequency. She's also noticed an increase in her blood pressure since her amlodipine-benazepril was switched to losartan.  She has a history of recurrent UTI with her last UTI being on September 18th, 2019, culture sensitive to Cephalexin which was the prescribed treatment. Her recent symptoms began three days ago dysuria and frequency. She's been taking AZO with resolve in her dysuria, but continues to experience frequency. She denies vaginal dryness/irritation.   During her last visit she reported a cough/tickle to the back of her throat that was irritative. Given reports of this her amlodipine-benazepril was switched to losartan 100 mg. She phoned in our office several days later with reports of elevated blood pressure readings, she also reported that the tickle to her throat wasn't bothersome so her regimen was resumed.   She has not checked her BP over the last 2 days. She endorses that the tickle never resolved.   BP Readings from Last 3 Encounters:  12/24/17 (!) 158/92  12/11/17 (!) 144/86  12/05/17 (!) 148/84     Review of Systems  Respiratory: Negative for cough and shortness of breath.   Cardiovascular: Negative for chest pain.  Genitourinary: Positive for dysuria and frequency. Negative for vaginal discharge.  Neurological: Negative for dizziness.       Past Medical History:  Diagnosis Date  . Benign essential HTN 03/21/2011  . Breast cancer, stage 1 ([place]) 02/10/2003   Right tubular breast cancer  . Cancer ([place])   . Diverticulosis of colon 03/23/2011  . Family history of breast cancer   . Family history of colon cancer   . Family history of melanoma   . Family history of prostate cancer   . Fibrocystic disease of breast  03/23/2011  . Graves' disease with exophthalmos 03/23/2011  . Hypertension   . Hypothyroid 03/21/2011  . IBS (irritable bowel syndrome) 03/23/2011  . ITP (idiopathic thrombocytopenic purpura) 03/21/2011  . S/P splenectomy 03/21/2011  . Uterus cancer ([place]) 03/23/1999   Well differentiated AdenoCA of endometrium-superficially confined  . Varicose vein of leg 03/23/2011     Social History   Socioeconomic History  . Marital status: Widowed    Spouse name: Not on file  . Number of children: Not on file  . Years of education: Not on file  . Highest education level: Not on file  Occupational History  . Not on file  Social Needs  . Financial resource strain: Not on file  . Food insecurity:    Worry: Not on file    Inability: Not on file  . Transportation needs:    Medical: Not on file    Non-medical: Not on file  Tobacco Use  . Smoking status: Never Smoker  . Smokeless tobacco: Never Used  Substance and Sexual Activity  . Alcohol use: No  . Drug use: No  . Sexual activity: Never    Birth control/protection: Surgical  Lifestyle  . Physical activity:    Days per week: Not on file    Minutes per session: Not on file  . Stress: Not on file  Relationships  . Social connections:    Talks on phone: Not on file    Gets together: Not on file  Attends religious service: Not on file    Active member of club or organization: Not on file    Attends meetings of clubs or organizations: Not on file    Relationship status: Not on file  . Intimate partner violence:    Fear of current or ex partner: Not on file    Emotionally abused: Not on file    Physically abused: Not on file    Forced sexual activity: Not on file  Other Topics Concern  . Not on file  Social History Narrative   Widow. Lives alone.    3 children, 5 grandchildren.   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   Enjoys reading, watching TV.    Past Surgical History:  Procedure Laterality Date  . ABDOMINAL HYSTERECTOMY  2001  .  APPENDECTOMY  1941  . BREAST SURGERY  02/17/2003   Mastectomy-Right  . MASTECTOMY  2004   Dr Margot Chimes  . OOPHORECTOMY     BSO  . SKIN CANCER EXCISION  2019   removal of cancer from ear  . SPLENECTOMY  1986    Family History  Problem Relation Age of Onset  . Breast cancer Mother        Age 75  . Hypertension Father   . Heart disease Father   . Lung cancer Father   . Breast cancer Sister        Age 29  . Melanoma Sister        dx in her 66s  . Dementia Sister   . Heart disease Maternal Aunt   . Prostate cancer Maternal Uncle   . Colon cancer Maternal Grandfather   . Colon cancer Maternal Uncle   . Prostate cancer Maternal Uncle   . Dementia Maternal Aunt   . Breast cancer Cousin        maternal 2nd cousin  . Breast cancer Cousin        maternal first cousin dx in her 31s  . Melanoma Son        dx in his 14s    Allergies  Allergen Reactions  . Sulfamethoxazole-Trimethoprim Hives  . Septra [Bactrim]     Current Outpatient Medications on File Prior to Visit  Medication Sig Dispense Refill  . ALPRAZolam (XANAX) 0.5 MG tablet TAKE 1/2 TABLET (0.25 MG) BY MOUTH ONCE DAILY AS NEEDED FOR ANXIETY. USE SPARINGLY. 30 tablet 0  . aspirin 81 MG tablet Take 81 mg by mouth daily.      Marland Kitchen CALCIUM-VITAMIN D PO Take by mouth.      . citalopram (CELEXA) 20 MG tablet Take 1/2 tablet by mouth once daily for anxiety. 45 tablet 3  . CRANBERRY PO Take 3 tablets by mouth daily.    . Diphenhydramine-APAP, sleep, (TYLENOL PM EXTRA STRENGTH PO) Take by mouth.      . metoprolol tartrate (LOPRESSOR) 50 MG tablet TAKE 1 TABLET BY MOUTH TWICE A DAY 180 tablet 1  . Multiple Vitamin (MULTI-VITAMIN PO) Take by mouth.      . rosuvastatin (CRESTOR) 5 MG tablet TAKE 1 TABLET BY MOUTH EVERY OTHER DAY 90 tablet 0   No current facility-administered medications on file prior to visit.     BP (!) 158/92   Pulse 72   Temp 98 F (36.7 C) (Oral)   Ht 5\' 4"  (1.626 m)   Wt 140 lb 8 oz (63.7 kg)   SpO2  97%   BMI 24.12 kg/m    Objective:   Physical Exam  Constitutional: She  is oriented to person, place, and time. She appears well-nourished.  Neck: Neck supple.  Cardiovascular: Normal rate and regular rhythm.  Respiratory: Effort normal and breath sounds normal.  GI: There is no CVA tenderness.  Neurological: She is alert and oriented to person, place, and time.  Skin: Skin is warm and dry.           Assessment & Plan:

## 2017-12-26 LAB — URINE CULTURE
MICRO NUMBER:: 91208624
SPECIMEN QUALITY:: ADEQUATE

## 2018-01-03 ENCOUNTER — Encounter: Payer: Self-pay | Admitting: *Deleted

## 2018-01-06 ENCOUNTER — Encounter: Payer: Self-pay | Admitting: Family Medicine

## 2018-01-06 ENCOUNTER — Ambulatory Visit: Payer: Medicare Other | Admitting: Family Medicine

## 2018-01-06 VITALS — BP 155/81 | HR 78 | Ht 64.0 in | Wt 143.0 lb

## 2018-01-06 DIAGNOSIS — N390 Urinary tract infection, site not specified: Secondary | ICD-10-CM

## 2018-01-06 MED ORDER — CEFUROXIME AXETIL 250 MG PO TABS: 250.0000 mg | ORAL_TABLET | Freq: Two times a day (BID) | ORAL | 0 refills | Status: DC

## 2018-01-06 NOTE — Progress Notes (Signed)
Patient presents today with urinary frequency and lower abdominal pain. She has been on ABX for UTI 2 times in the last month. She states she has not had any Urological surgeries in the last 30 days. A urine was collected for UA, UCX. Stoioff reviewed the urine result and sent Ceftin 250 BID for 7 days.

## 2018-01-07 LAB — URINALYSIS, COMPLETE
BILIRUBIN UA: NEGATIVE
Glucose, UA: NEGATIVE
Ketones, UA: NEGATIVE
Nitrite, UA: POSITIVE — AB
PH UA: 7 (ref 5.0–7.5)
PROTEIN UA: NEGATIVE
SPEC GRAV UA: 1.015 (ref 1.005–1.030)
Urobilinogen, Ur: 0.2 mg/dL (ref 0.2–1.0)

## 2018-01-07 LAB — MICROSCOPIC EXAMINATION: RBC, UA: NONE SEEN /hpf (ref 0–2)

## 2018-01-08 ENCOUNTER — Telehealth: Payer: Self-pay

## 2018-01-08 NOTE — Telephone Encounter (Signed)
Spoken and notified patient of Kate Clark's comments. Patient verbalized understanding.  

## 2018-01-08 NOTE — Telephone Encounter (Signed)
Copied from [place] [id number]. Topic: General - Other >> Jan 08, 2018  9:12 AM Janace Aris A wrote: Reason for CRM: pt called in she says her PCP wanted her to write down her BP readings. These readings start on Oct 10th;  147/83,127/25 127/21,126/74,176/73,112/69,116/66,116/68,117/70,114/66,109/66,101/56,101/63. 155/81 after seeing the urologist.   Also pt is saying she never received a call for her urine culture, and also she would like her blood report from her wellness visit.

## 2018-01-08 NOTE — Telephone Encounter (Signed)
Please thank her for the blood pressure readings, they look good! Please send her a copy of her labs from her wellness visit.  Her urine culture is still pending. No result as of yet.

## 2018-01-09 DIAGNOSIS — H2513 Age-related nuclear cataract, bilateral: Secondary | ICD-10-CM | POA: Diagnosis not present

## 2018-01-09 LAB — CULTURE, URINE COMPREHENSIVE

## 2018-01-10 ENCOUNTER — Telehealth: Payer: Self-pay | Admitting: Family Medicine

## 2018-01-10 NOTE — Telephone Encounter (Signed)
Patient notified and voiced understanding.

## 2018-01-10 NOTE — Telephone Encounter (Signed)
-----   Message from Nori Riis, PA-C sent at 01/10/2018  7:34 AM EDT ----- Please let Mrs. Buccellato know that her urine culture was positive.  The Ceftin prescribed for her is the appropriate antibiotic.

## 2018-01-17 ENCOUNTER — Other Ambulatory Visit: Payer: Medicare Other

## 2018-01-17 ENCOUNTER — Other Ambulatory Visit: Payer: Self-pay | Admitting: Family Medicine

## 2018-01-17 DIAGNOSIS — N390 Urinary tract infection, site not specified: Secondary | ICD-10-CM

## 2018-01-17 LAB — MICROSCOPIC EXAMINATION
Bacteria, UA: NONE SEEN
RBC, UA: NONE SEEN /hpf (ref 0–2)

## 2018-01-17 LAB — URINALYSIS, COMPLETE
BILIRUBIN UA: NEGATIVE
Glucose, UA: NEGATIVE
Ketones, UA: NEGATIVE
NITRITE UA: NEGATIVE
PH UA: 7 (ref 5.0–7.5)
PROTEIN UA: NEGATIVE
Specific Gravity, UA: 1.015 (ref 1.005–1.030)
Urobilinogen, Ur: 0.2 mg/dL (ref 0.2–1.0)

## 2018-01-20 LAB — CULTURE, URINE COMPREHENSIVE

## 2018-01-23 ENCOUNTER — Other Ambulatory Visit: Payer: Self-pay | Admitting: Family Medicine

## 2018-01-23 DIAGNOSIS — N390 Urinary tract infection, site not specified: Secondary | ICD-10-CM

## 2018-01-23 DIAGNOSIS — F411 Generalized anxiety disorder: Secondary | ICD-10-CM

## 2018-01-23 MED ORDER — CEFUROXIME AXETIL 500 MG PO TABS: 500.0000 mg | ORAL_TABLET | Freq: Two times a day (BID) | ORAL | 0 refills | Status: DC

## 2018-01-27 ENCOUNTER — Telehealth: Payer: Self-pay

## 2018-01-27 NOTE — Telephone Encounter (Signed)
Pt called and she is mixed up about her BP med. On med list has amlodipine-benazepril 10-20 mg taking one tab daily. HCTZ 25 mg taking one tab daily. Metoprolol tartrate 50 mg  Taking one tab bid. Pt tries to put away her old medicine but pt found cozaar and wants to make sure pt is not supposed to take cozaar. Per nurse triage note on 12/20/17. Pt voiced understanding and said her BP was doing well on the present BP regimen. Pt will put the cozaar away and continue taking amlodipine-benazepril 10 - 20 mg, HCTZ 25 mg and Metoprolol tartrate 50 mg as instructed. Pt voiced understanding and will cb if needed.

## 2018-01-29 ENCOUNTER — Telehealth: Payer: Self-pay | Admitting: Urology

## 2018-01-29 NOTE — Telephone Encounter (Signed)
Patient's son, Hulda Reddix [phone]) called the office today about his mom.  She has experienced recurrent UTIs and is requesting a daily low dose antibiotic to help prevent UTIs.    She uses the [facility].  Please contact the patient and let her know.

## 2018-01-29 NOTE — Telephone Encounter (Signed)
Please advise on daily low dose antibiotics

## 2018-01-30 NOTE — Telephone Encounter (Signed)
I reviewed her last few cultures and unfortunately the bacteria was resistant to the antibiotics we used for low-dose suppression.

## 2018-01-31 NOTE — Telephone Encounter (Signed)
Called pt's son Elta Guadeloupe) no answer. LM for pt informing him of the information below. Advised pt to call back for questions or concerns.

## 2018-02-06 ENCOUNTER — Other Ambulatory Visit: Payer: Self-pay | Admitting: Primary Care

## 2018-02-06 DIAGNOSIS — F411 Generalized anxiety disorder: Secondary | ICD-10-CM

## 2018-02-06 NOTE — Telephone Encounter (Signed)
Name of Medication: ALPRAZolam Duanne Moron) 0.5 MG tablet   Name of Pharmacy: [facility]   Last Fill or Written Date and Quantity: 12/11/2017  #30   Last Office Visit and Type: 12/24/2017 acute appt  Next Office Visit and Type:   Last Controlled Substance Agreement Date: 04/23/2016  Last UDS:

## 2018-02-06 NOTE — Telephone Encounter (Signed)
Noted, no suspicious activity noted on PMP aware website.

## 2018-02-07 ENCOUNTER — Other Ambulatory Visit: Payer: Self-pay | Admitting: Primary Care

## 2018-02-10 ENCOUNTER — Telehealth: Payer: Self-pay | Admitting: Primary Care

## 2018-02-10 NOTE — Telephone Encounter (Signed)
Place form/paperwork in Ashlee Lopez's inbox for review and complete if necessary.  

## 2018-02-10 NOTE — Telephone Encounter (Signed)
What's the reason for the handicap placard?

## 2018-02-10 NOTE — Telephone Encounter (Signed)
Pt dropped off form to be filled out for parking placard. States she lost hers. Placed form in [place] tower.

## 2018-02-11 ENCOUNTER — Encounter: Payer: Self-pay | Admitting: *Deleted

## 2018-02-11 NOTE — Telephone Encounter (Signed)
Spoken to patient. She stated for her back pain, spinal stenosis.

## 2018-02-11 NOTE — Telephone Encounter (Signed)
Spoken to patient and she was notified that forms are ready for pick up. Left in the front office.

## 2018-02-11 NOTE — Telephone Encounter (Signed)
Noted. Form completed and placed in Chan's inbox.

## 2018-02-28 ENCOUNTER — Ambulatory Visit (INDEPENDENT_AMBULATORY_CARE_PROVIDER_SITE_OTHER): Payer: Medicare Other | Admitting: Urology

## 2018-02-28 ENCOUNTER — Other Ambulatory Visit: Payer: Self-pay | Admitting: Urology

## 2018-02-28 ENCOUNTER — Encounter: Payer: Self-pay | Admitting: Urology

## 2018-02-28 VITALS — BP 159/84 | HR 71 | Ht 64.0 in | Wt 143.5 lb

## 2018-02-28 DIAGNOSIS — N390 Urinary tract infection, site not specified: Secondary | ICD-10-CM | POA: Diagnosis not present

## 2018-02-28 LAB — URINALYSIS, COMPLETE
Bilirubin, UA: NEGATIVE
Glucose, UA: NEGATIVE
Ketones, UA: NEGATIVE
NITRITE UA: POSITIVE — AB
PH UA: 7 (ref 5.0–7.5)
Protein, UA: NEGATIVE
Specific Gravity, UA: 1.015 (ref 1.005–1.030)
Urobilinogen, Ur: 0.2 mg/dL (ref 0.2–1.0)

## 2018-02-28 LAB — MICROSCOPIC EXAMINATION: EPITHELIAL CELLS (NON RENAL): NONE SEEN /HPF (ref 0–10)

## 2018-02-28 MED ORDER — CEPHALEXIN 500 MG PO CAPS: 500.0000 mg | ORAL_CAPSULE | Freq: Two times a day (BID) | ORAL | 0 refills | Status: DC

## 2018-02-28 MED ORDER — ESTROGENS, CONJUGATED 0.625 MG/GM VA CREA: TOPICAL_CREAM | VAGINAL | 12 refills | Status: DC

## 2018-02-28 NOTE — Progress Notes (Signed)
02/28/2018 12:19 PM   Ashlee Lopez [birth year] [id number]  Referring provider: Pleas Koch, NP [place],  [address]  Chief Complaint  Patient presents with  . Recurrent UTI    HPI: Ashlee Lopez is a 84 yo F who presents today for the evaluation and management of rUTIs, frequency, and burning. Her last visit with [place] was on 02/22/2018.   She c/o of burning and frequency when urinating. She denies of gross hematuria and has suprapubic and flank pain. She drinks about 3-4 16oz bottles a day, one cup of coffee (cream and sugar) in the morning and denies drinking sodas, alcohol, smoke, and energy drinks. She does drink caffeine free tea and milk just in cereal. She reports of losing weight and has nocturia 2-3x. During the day she goes to bathroom every 30 min, she says every 2 hours is typical. She reports of emptying her bladder well. Recently for the past 3 days she has gone through 2-3 pads as opposed to 1 pad which is typical for her. She reports of urinary leakage before bathroom visit and sometimes while she coughs, laughs and sneezes.   She has a SHx of hysterectomy, splenectomy, and appendectomy. She is not sexually active, does not soak in tub, and has IBS and constipation.   She reports of taking cranberry tablets for the past year and a half. She does not take probiotics and has not used vaginal estrogen cream.   Her CATH UA shows 11-30 WBC, many bacteria, otherwise unremarkable.    PMH: Past Medical History:  Diagnosis Date  . Benign essential HTN 03/21/2011  . Breast cancer, stage 1 ([place]) 02/10/2003   Right tubular breast cancer  . Cancer ([place])   . Diverticulosis of colon 03/23/2011  . Family history of breast cancer   . Family history of colon cancer   . Family history of melanoma   . Family history of prostate cancer   . Fibrocystic disease of breast 03/23/2011  . Graves' disease with exophthalmos 03/23/2011  . Hypertension   . Hypothyroid  03/21/2011  . IBS (irritable bowel syndrome) 03/23/2011  . ITP (idiopathic thrombocytopenic purpura) 03/21/2011  . S/P splenectomy 03/21/2011  . 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 Varicose vein of leg 03/23/2011    Surgical History: Past Surgical History:  Procedure Laterality Date  . ABDOMINAL HYSTERECTOMY  2001  . APPENDECTOMY  1941  . BREAST SURGERY  02/17/2003   Mastectomy-Right  . MASTECTOMY  2004   Dr Margot Chimes  . OOPHORECTOMY     BSO  . SKIN CANCER EXCISION  2019   removal of cancer from ear  . SPLENECTOMY  1986    Home Medications:  Allergies as of 02/28/2018      Reactions   Sulfamethoxazole-trimethoprim Hives   Septra [bactrim]       Medication List       Accurate as of February 28, 2018 12:19 PM. Always use your most recent med list.        ALPRAZolam 0.5 MG tablet Commonly known as:  XANAX TAKE 1/2 TABLET (0.25MG) BY MOUTH ONCE DAILY AS NEEDED FOR ANXIETY. USE SPARINGLY   amLODipine-benazepril 10-20 MG capsule Commonly known as:  LOTREL Take 1 tablet by mouth once daily for blood pressure.   aspirin 81 MG tablet Take 81 mg by mouth daily.   CALCIUM-VITAMIN D PO Take by mouth.   cephALEXin 500 MG capsule Commonly known  as:  KEFLEX Take 1 capsule (500 mg total) by mouth 2 (two) times daily.   citalopram 20 MG tablet Commonly known as:  CELEXA Take 1/2 tablet by mouth once daily for anxiety.   conjugated estrogens vaginal cream Commonly known as:  PREMARIN Apply 0.65m (pea-sized amount)  just inside the vaginal introitus with a finger-tip on  Monday, Wednesday and Friday nights.   CRANBERRY PO Take 3 tablets by mouth daily.   hydrochlorothiazide 25 MG tablet Commonly known as:  HYDRODIURIL Take 1 tablet (25 mg total) by mouth daily. For blood pressure.   levothyroxine 100 MCG tablet Commonly known as:  SYNTHROID, LEVOTHROID Take 1 tablet by mouth every morning with water only. No  food or medications for 30 minutes.   metoprolol tartrate 50 MG tablet Commonly known as:  LOPRESSOR TAKE 1 TABLET BY MOUTH TWICE A DAY   MULTI-VITAMIN PO Take by mouth.   ranitidine 150 MG tablet Commonly known as:  ZANTAC Take 1 tablet by mouth once or twice daily for heartburn.   rosuvastatin 5 MG tablet Commonly known as:  CRESTOR TAKE 1 TABLET BY MOUTH EVERY OTHER DAY   TYLENOL PM EXTRA STRENGTH PO Take by mouth.       Allergies:  Allergies  Allergen Reactions  . Sulfamethoxazole-Trimethoprim Hives  . Septra [Bactrim]     Family History: Family History  Problem Relation Age of Onset  . Breast cancer Mother        Age 82 . Hypertension Father   . Heart disease Father   . Lung cancer Father   . Breast cancer Sister        Age 82 . Melanoma Sister        dx in her 663s . Dementia Sister   . Heart disease Maternal Aunt   . Prostate cancer Maternal Uncle   . Colon cancer Maternal Grandfather   . Colon cancer Maternal Uncle   . Prostate cancer Maternal Uncle   . Dementia Maternal Aunt   . Breast cancer Cousin        maternal 2nd cousin  . Breast cancer Cousin        maternal first cousin dx in her 539s . Melanoma Son        dx in his 468s   Social History:  reports that she has never smoked. She has never used smokeless tobacco. She reports that she does not drink alcohol or use drugs.  ROS: UROLOGY Frequent Urination?: Yes Hard to postpone urination?: Yes Burning/pain with urination?: Yes Get up at night to urinate?: Yes Leakage of urine?: No Urine stream starts and stops?: No Trouble starting stream?: No Do you have to strain to urinate?: No Blood in urine?: No Urinary tract infection?: Yes Sexually transmitted disease?: No Injury to kidneys or bladder?: No Painful intercourse?: No Weak stream?: No Currently pregnant?: No Vaginal bleeding?: No Last menstrual period?: n  Gastrointestinal Nausea?: No Vomiting?:  No Indigestion/heartburn?: No Diarrhea?: No Constipation?: No  Constitutional Fever: No Night sweats?: No Weight loss?: No Fatigue?: No  Skin Skin rash/lesions?: No Itching?: No  Eyes Blurred vision?: No Double vision?: No  Ears/Nose/Throat Sore throat?: No Sinus problems?: No  Hematologic/Lymphatic Swollen glands?: No Easy bruising?: Yes  Cardiovascular Leg swelling?: Yes Chest pain?: No  Respiratory Cough?: Yes Shortness of breath?: No  Endocrine Excessive thirst?: No  Musculoskeletal Back pain?: Yes Joint pain?: Yes  Neurological Headaches?: No Dizziness?: No  Psychologic Depression?: No Anxiety?: Yes  Physical Exam: BP (!) 159/84 (BP Location: Left Arm, Patient Position: Sitting, Cuff Size: Normal)   Pulse 71   Ht 5' 4" (1.626 m)   Wt 143 lb 8 oz (65.1 kg)   BMI 24.63 kg/m   Constitutional:  Well nourished. Alert and oriented, No acute distress. HEENT: [place] AT, moist mucus membranes.  Trachea midline, no masses. Cardiovascular: No clubbing, cyanosis, or edema. Respiratory: Normal respiratory effort, no increased work of breathing.  GU: No CVA tenderness.  No bladder fullness or masses.  atrophic external genitalia, sparse pubic hair distribution, no lesions.  Normal urethral meatus, no lesions, no prolapse, no discharge.   No urethral masses, tenderness and/or tenderness. No bladder fullness, tenderness or masses. pale vagina mucosa, poor estrogen effect, no discharge, no lesions, poor pelvic support, grade cystocele and no coruncle rectocele noted.  No cervical motion tenderness.  Anus and perineum are without rashes or lesions. Uterus, cervix and apex is surgically absent. Skin: No rashes, bruises or suspicious lesions. Neurologic: Grossly intact, no focal deficits, moving all 4 extremities. Psychiatric: Normal mood and affect.   Urinalysis I have reviewed UA. CATH UA shows 11-30 WBC, and many bacteria.  I have reviewed the labs.  Assessment &  Plan:   1. rUTI  - criteria for recurrent UTI has been met with 2 or more infections in 6 months or 3 or greater infections in one year   - patient is instructed to increase their water intake until the urine is pale yellow or clear (10 to 12 cups daily)   - patient is instructed to take probiotics (yogurt, oral pills or vaginal suppositories), take cranberry pills or drink the juice and Vitamin C 1,000 mg daily to acidify the urine   - avoid soaking in tubs and wipe front to back after urinating                                     -CATH UA positive for WBC and bacteria - suspicious for infection - will send for culture - will start Keflex and adjust if necessary once cultures are final   -Return for pending urine culture   - RUS in January to rule out nidus for infection   2. Vaginal Atrophy  I explained to the patient that when women go through menopause and her estrogen levels are severely diminished, the normal vaginal flora will change.  This is due to an increase of the vaginal canal's pH. Because of this, the vaginal canal may be colonized by bacteria from the rectum instead of the protective lactobacillus.  This, accompanied by the loss of the mucus barrier with vaginal atrophy, is a cause of recurrent urinary tract infections. In some studies, the use of vaginal estrogen cream has been demonstrated to reduce  recurrent urinary tract infections to one a year.  Patient was given a sample of vaginal estrogen cream (Premarin vaginal cream) and instructed to apply 0.38m (pea-sized amount)  just inside the vaginal introitus with a finger-tip on Monday, Wednesday and Friday nights.  I explained to the patient that vaginally administered estrogen, which causes only a slight increase in the blood estrogen levels, have fewer contraindications and adverse systemic effects that oral HT. I have also given prescription for Premarin cream.  If she finds the medication cost prohibitive, she is instructed to  call the office.  We can then call in a compounded vaginal estrogen cream  for the patient that may be more affordable.   She will follow up in three months for an exam.     Return for pending Urine culture.  Zara Council, PA-C  [facility] [address] [place], [place] [address] [phone]  I, Lucas Mallow, am acting as a Education administrator for [employer],  I have reviewed the above documentation for accuracy and completeness, and I agree with the above.    Zara Council, PA-C

## 2018-02-28 NOTE — Patient Instructions (Addendum)
I have given you prescription for Premarin.  Please take these to your pharmacy and see which one your insurance covers.  If both are too expensive, please call the office at [phone] for an alternative.  You are given a sample of vaginal estrogen cream Premarin and instructed to apply 0.5mg  (pea-sized amount)  just inside the vaginal introitus with a finger-tip on Monday, Wednesday and Friday nights,     I

## 2018-02-28 NOTE — Progress Notes (Signed)
In and Out Catheterization  Patient is present today for a I & O catheterization due to recurrent UTI. Patient was cleaned and prepped in a sterile fashion with betadine and Lidocaine 2% jelly was instilled into the urethra.  A 14FR cath was inserted no complications were noted , 139ml of urine return was noted, urine was orange in color. A clean urine sample was collected for UA, UCX. Bladder was drained  And catheter was removed with out difficulty.    Preformed by: Elberta Leatherwood, CMA

## 2018-03-03 ENCOUNTER — Telehealth: Payer: Self-pay

## 2018-03-03 LAB — CULTURE, URINE COMPREHENSIVE

## 2018-03-03 NOTE — Telephone Encounter (Signed)
Left detailed message per DPR  

## 2018-03-03 NOTE — Telephone Encounter (Signed)
-----   Message from Nori Riis, PA-C sent at 03/03/2018  7:39 AM EST ----- Please let Mrs. Pincus know that her urine culture was positive for infection.  She needs to continue the Keflex as it should take care of the infection.  She needs to take all the antibiotic, even if she is feeling better.   We will see her sometime in January after she has her RUS.

## 2018-03-17 ENCOUNTER — Other Ambulatory Visit: Payer: Self-pay | Admitting: Primary Care

## 2018-03-17 DIAGNOSIS — I10 Essential (primary) hypertension: Secondary | ICD-10-CM

## 2018-03-24 ENCOUNTER — Ambulatory Visit (INDEPENDENT_AMBULATORY_CARE_PROVIDER_SITE_OTHER): Payer: Medicare Other | Admitting: Family Medicine

## 2018-03-24 VITALS — BP 151/67 | HR 74 | Ht 64.0 in

## 2018-03-24 DIAGNOSIS — N390 Urinary tract infection, site not specified: Secondary | ICD-10-CM

## 2018-03-24 DIAGNOSIS — R300 Dysuria: Secondary | ICD-10-CM | POA: Diagnosis not present

## 2018-03-24 LAB — URINALYSIS, COMPLETE
BILIRUBIN UA: NEGATIVE
Glucose, UA: NEGATIVE
KETONES UA: NEGATIVE
Nitrite, UA: POSITIVE — AB
PROTEIN UA: NEGATIVE
Specific Gravity, UA: 1.015 (ref 1.005–1.030)
Urobilinogen, Ur: 0.2 mg/dL (ref 0.2–1.0)
pH, UA: 7.5 (ref 5.0–7.5)

## 2018-03-24 LAB — MICROSCOPIC EXAMINATION

## 2018-03-24 MED ORDER — NITROFURANTOIN MONOHYD MACRO 100 MG PO CAPS: 100.0000 mg | ORAL_CAPSULE | Freq: Two times a day (BID) | ORAL | 0 refills | Status: DC

## 2018-03-24 NOTE — Progress Notes (Signed)
Patient presents today with Urinary frequency and dysuria. Her symptoms began yesterday. She has not had any Urological surgeries in the last 30 days. A urine was collected for UA, UCX. Keflex 500mg  BID was the last ABX she was on for 7 days. This medication was given on 02/28/2018. Dr. Diamantina Providence reviewed the UA and Macrobid was sent to pharmacy.

## 2018-03-25 ENCOUNTER — Other Ambulatory Visit: Payer: Self-pay | Admitting: Primary Care

## 2018-03-25 DIAGNOSIS — F411 Generalized anxiety disorder: Secondary | ICD-10-CM

## 2018-03-25 MED ORDER — ALPRAZOLAM 0.25 MG PO TABS: ORAL_TABLET | ORAL | 0 refills | Status: DC

## 2018-03-25 NOTE — Telephone Encounter (Signed)
Name of [name]) 0.5 MG tablet  Name of [place] or Written Date and Quantity: 02/06/2018 #30  Last Office Visit and Type:12/24/2017 acute appt  Next Office Visit and Type:

## 2018-03-25 NOTE — Telephone Encounter (Signed)
Please notify patient that I would like for her to continue to work on weaning off of her Xanax.  I recommend we reduce her dose to 0.25 mg and have her take this only as needed for breakthrough anxiety.  I will send a new prescription to her pharmacy now.

## 2018-03-26 LAB — CULTURE, URINE COMPREHENSIVE

## 2018-03-26 NOTE — Telephone Encounter (Signed)
Message left for patient to return my call.  

## 2018-03-28 ENCOUNTER — Telehealth: Payer: Self-pay | Admitting: *Deleted

## 2018-03-28 ENCOUNTER — Ambulatory Visit
Admission: RE | Admit: 2018-03-28 | Discharge: 2018-03-28 | Disposition: A | Discharge status: Home / Self Care | Payer: Medicare Other | Source: Ambulatory Visit | Attending: Urology | Admitting: Urology

## 2018-03-28 DIAGNOSIS — N281 Cyst of kidney, acquired: Secondary | ICD-10-CM | POA: Diagnosis not present

## 2018-03-28 DIAGNOSIS — N390 Urinary tract infection, site not specified: Secondary | ICD-10-CM | POA: Insufficient documentation

## 2018-03-28 NOTE — Telephone Encounter (Signed)
Patient called stating that she has been on Alprazolam 0.5 mg and when it was refilled she was given 0.25 mg and wants to know why it was changed? Patient stated that if she is not at home when you call back it is okay to leave the message on her answering machine.  Pharmacy [facility]

## 2018-03-28 NOTE — Telephone Encounter (Signed)
Please notify patient that as discussed during numerous office visits our goal is to wean her down on her alprazolam use.  I recommend she try the lower dose with a goal of eventually weaning off.  We will wean her off slowly as always discussed.  This medication when taking for long periods of time can cause memory problems, risk for falls/feeling off balance, addiction.

## 2018-03-28 NOTE — Telephone Encounter (Signed)
Spoken and notified patient of Kate Clark's comments. Patient verbalized understanding.  

## 2018-03-31 ENCOUNTER — Telehealth: Payer: Self-pay

## 2018-03-31 NOTE — Telephone Encounter (Signed)
Informed patient of results and recommendations.  Follow up appointment made.

## 2018-03-31 NOTE — Telephone Encounter (Signed)
-----   Message from Nori Riis, PA-C sent at 03/31/2018  8:04 AM EST ----- Please let [name] know that her kidney ultrasound was normal and there was nothing seen to be contributing to her infections.  She needs to continue the vaginal estrogen, applying it three nights weekly (M,W,F), increase water intake (work up to 10 cups a day) and take or drink cranberry juice.  I would like to see her in February for a recheck.

## 2018-04-01 DIAGNOSIS — D2261 Melanocytic nevi of right upper limb, including shoulder: Secondary | ICD-10-CM | POA: Diagnosis not present

## 2018-04-01 DIAGNOSIS — D225 Melanocytic nevi of trunk: Secondary | ICD-10-CM | POA: Diagnosis not present

## 2018-04-01 DIAGNOSIS — B351 Tinea unguium: Secondary | ICD-10-CM | POA: Diagnosis not present

## 2018-04-01 DIAGNOSIS — L814 Other melanin hyperpigmentation: Secondary | ICD-10-CM | POA: Diagnosis not present

## 2018-04-01 DIAGNOSIS — L2089 Other atopic dermatitis: Secondary | ICD-10-CM | POA: Diagnosis not present

## 2018-04-01 DIAGNOSIS — B353 Tinea pedis: Secondary | ICD-10-CM | POA: Diagnosis not present

## 2018-04-01 DIAGNOSIS — L7211 Pilar cyst: Secondary | ICD-10-CM | POA: Diagnosis not present

## 2018-04-01 DIAGNOSIS — Z85828 Personal history of other malignant neoplasm of skin: Secondary | ICD-10-CM | POA: Diagnosis not present

## 2018-04-01 DIAGNOSIS — D2271 Melanocytic nevi of right lower limb, including hip: Secondary | ICD-10-CM | POA: Diagnosis not present

## 2018-04-01 DIAGNOSIS — L821 Other seborrheic keratosis: Secondary | ICD-10-CM | POA: Diagnosis not present

## 2018-04-23 ENCOUNTER — Other Ambulatory Visit: Payer: Self-pay | Admitting: Primary Care

## 2018-04-23 DIAGNOSIS — F411 Generalized anxiety disorder: Secondary | ICD-10-CM

## 2018-04-23 NOTE — Telephone Encounter (Signed)
Please notify patient that we will continue to gradually wean her off of her alprazolam. How much is she taking (1/2 pill or 1 pill)  and how often?

## 2018-04-23 NOTE — Telephone Encounter (Signed)
Name of [place] Duanne Moron) 0.5 MG tablet  Name of [place] or Written Date and Quantity: 03/25/2018 #15  Last Office Visit and Type:12/24/2017 acute appt

## 2018-04-25 NOTE — Telephone Encounter (Signed)
Message left for patient to return my call.  

## 2018-04-29 DIAGNOSIS — M545 Low back pain: Secondary | ICD-10-CM | POA: Diagnosis not present

## 2018-04-29 NOTE — Telephone Encounter (Signed)
Message left for patient to return my call.  

## 2018-04-29 NOTE — Telephone Encounter (Signed)
Tried to call patient but could not leave leave message since busy signal.

## 2018-05-09 ENCOUNTER — Ambulatory Visit: Payer: Self-pay | Admitting: Urology

## 2018-05-09 ENCOUNTER — Ambulatory Visit (INDEPENDENT_AMBULATORY_CARE_PROVIDER_SITE_OTHER): Payer: Medicare Other | Admitting: Urology

## 2018-05-09 ENCOUNTER — Encounter: Payer: Self-pay | Admitting: Urology

## 2018-05-09 VITALS — BP 122/69 | HR 80 | Ht 64.0 in

## 2018-05-09 DIAGNOSIS — R3915 Urgency of urination: Secondary | ICD-10-CM

## 2018-05-09 DIAGNOSIS — N3946 Mixed incontinence: Secondary | ICD-10-CM

## 2018-05-09 DIAGNOSIS — R8271 Bacteriuria: Secondary | ICD-10-CM | POA: Diagnosis not present

## 2018-05-09 LAB — MICROSCOPIC EXAMINATION: Epithelial Cells (non renal): >10 /hpf — ABNORMAL HIGH (ref 0–10)

## 2018-05-09 LAB — URINALYSIS, COMPLETE
Bilirubin, UA: NEGATIVE
Glucose, UA: NEGATIVE
KETONES UA: NEGATIVE
Nitrite, UA: NEGATIVE
Protein, UA: NEGATIVE
SPEC GRAV UA: 1.02 (ref 1.005–1.030)
UUROB: 0.2 mg/dL (ref 0.2–1.0)
pH, UA: 7.5 (ref 5.0–7.5)

## 2018-05-09 NOTE — Progress Notes (Signed)
05/09/2018 11:55 AM   Ashlee Lopez 03/23/1933 [id number]  Referring provider: Pleas Koch, NP [place], [place] [address]  No chief complaint on file.   HPI:  Ashlee Lopez is a patient followed for OAB symptoms, mixed incontinence and recurrent UTI.  She has a history of dysuria and mixed incontinence, but also asymptomatic bacteriuria. Low dose abx suppression was discussed but not started. She followed up with PA McGowan and had a normal pelvic exam exhibiting atrophic vaginitis.  PA McGowan put her on probiotics, vitamin C and cranberry supplements as well as transvaginal estrogen.  Renal ultrasound was normal March 28, 2018. PVR has been normal.   Today, she has frequency and urgency. She wears a few ppd due to UUI. She has occasional urine odor and cloudiness. No dysuria. UA today was sent, but again she is not having dysuria or bladder pain.  No fever.  PMH: Past Medical History:  Diagnosis Date  . Benign essential HTN 03/21/2011  . Breast cancer, stage 1 ([place]) 02/10/2003   Right tubular breast cancer  . Cancer ([place])   . Diverticulosis of colon 03/23/2011  . Family history of breast cancer   . Family history of colon cancer   . Family history of melanoma   . Family history of prostate cancer   . Fibrocystic disease of breast 03/23/2011  . Graves' disease with exophthalmos 03/23/2011  . Hypertension   . Hypothyroid 03/21/2011  . IBS (irritable bowel syndrome) 03/23/2011  . ITP (idiopathic thrombocytopenic purpura) 03/21/2011  . S/P splenectomy 03/21/2011  . Uterus cancer ([place]) 03/23/1999   Well differentiated AdenoCA of endometrium-superficially confined  . Varicose vein of leg 03/23/2011    Surgical History: Past Surgical History:  Procedure Laterality Date  . ABDOMINAL HYSTERECTOMY  2001  . APPENDECTOMY  1941  . BREAST SURGERY  02/17/2003   Mastectomy-Right  . MASTECTOMY  2004   Dr Margot Chimes  . OOPHORECTOMY     BSO  . SKIN CANCER EXCISION  2019   removal of  cancer from ear  . SPLENECTOMY  1986    Home Medications:  Allergies as of 05/09/2018      Reactions   Sulfamethoxazole-trimethoprim Hives   Septra [bactrim]       Medication List       Accurate as of May 09, 2018 11:55 AM. Always use your most recent med list.        ALPRAZolam 0.25 MG tablet Commonly known as:  XANAX Take 1/2 to 1 tablet by mouth daily as needed for anxiety.  Use sparingly.   amLODipine-benazepril 10-20 MG capsule Commonly known as:  LOTREL TAKE 1 CAPSULE BY MOUTH ONCE DAILY FOR BLOOD PRESSURE   aspirin 81 MG tablet Take 81 mg by mouth daily.   CALCIUM-VITAMIN D PO Take by mouth.   cephALEXin 500 MG capsule Commonly known as:  KEFLEX Take 1 capsule (500 mg total) by mouth 2 (two) times daily.   citalopram 20 MG tablet Commonly known as:  CELEXA Take 1/2 tablet by mouth once daily for anxiety.   conjugated estrogens vaginal cream Commonly known as:  PREMARIN Apply 0.5mg  (pea-sized amount)  just inside the vaginal introitus with a finger-tip on  Monday, Wednesday and Friday nights.   CRANBERRY PO Take 3 tablets by mouth daily.   hydrochlorothiazide 25 MG tablet Commonly known as:  HYDRODIURIL Take 1 tablet (25 mg total) by mouth daily. For blood pressure.   levothyroxine 100 MCG tablet Commonly known  as:  SYNTHROID, LEVOTHROID Take 1 tablet by mouth every morning with water only. No food or medications for 30 minutes.   metoprolol tartrate 50 MG tablet Commonly known as:  LOPRESSOR TAKE 1 TABLET BY MOUTH TWICE A DAY   MULTI-VITAMIN PO Take by mouth.   nitrofurantoin (macrocrystal-monohydrate) 100 MG capsule Commonly known as:  MACROBID Take 1 capsule (100 mg total) by mouth every 12 (twelve) hours.   ranitidine 150 MG tablet Commonly known as:  ZANTAC Take 1 tablet by mouth once or twice daily for heartburn.   rosuvastatin 5 MG tablet Commonly known as:  CRESTOR TAKE 1 TABLET BY MOUTH EVERY OTHER DAY   TYLENOL PM EXTRA  STRENGTH PO Take by mouth.       Allergies:  Allergies  Allergen Reactions  . Sulfamethoxazole-Trimethoprim Hives  . Septra [Bactrim]     Family History: Family History  Problem Relation Age of Onset  . Breast cancer Mother        Age 62  . Hypertension Father   . Heart disease Father   . Lung cancer Father   . Breast cancer Sister        Age 73  . Melanoma Sister        dx in her 51s  . Dementia Sister   . Heart disease Maternal Aunt   . Prostate cancer Maternal Uncle   . Colon cancer Maternal Grandfather   . Colon cancer Maternal Uncle   . Prostate cancer Maternal Uncle   . Dementia Maternal Aunt   . Breast cancer Cousin        maternal 2nd cousin  . Breast cancer Cousin        maternal first cousin dx in her 61s  . Melanoma Son        dx in his 75s    Social History:  reports that she has never smoked. She has never used smokeless tobacco. She reports that she does not drink alcohol or use drugs.  ROS:                                        Physical Exam: There were no vitals taken for this visit.  Constitutional:  Alert and oriented, No acute distress. HEENT: [place] AT, moist mucus membranes.  Trachea midline, no masses. Cardiovascular: No clubbing, cyanosis, or edema. Respiratory: Normal respiratory effort, no increased work of breathing. GI: Abdomen is soft, nontender, nondistended, no abdominal masses GU: No CVA tenderness Skin: No rashes, bruises or suspicious lesions. Neurologic: Grossly intact, no focal deficits, moving all 4 extremities. Psychiatric: Normal mood and affect.  Laboratory Data: Lab Results  Component Value Date   WBC 6.5 11/21/2016   HGB 13.8 11/21/2016   HCT 39.2 11/21/2016   MCV 90 11/21/2016   PLT 323 11/21/2016    Lab Results  Component Value Date   CREATININE 0.66 12/04/2017    No results found for: PSA  No results found for: TESTOSTERONE  Lab Results  Component Value Date   HGBA1C 5.6  12/04/2017    Urinalysis    Component Value Date/Time   APPEARANCEUR Cloudy (A) 03/24/2018 1053   GLUCOSEU Negative 03/24/2018 1053   BILIRUBINUR Negative 03/24/2018 1053   PROTEINUR Negative 03/24/2018 1053   UROBILINOGEN 0.2 12/24/2017 0830   NITRITE Positive (A) 03/24/2018 1053   LEUKOCYTESUR 1+ (A) 03/24/2018 1053    Lab Results  Component  Value Date   LABMICR See below: 03/24/2018   WBCUA 11-30 (A) 03/24/2018   RBCUA 0-2 03/24/2018   LABEPIT 0-10 03/24/2018   MUCUS Present (A) 03/24/2018   BACTERIA Many (A) 03/24/2018    Pertinent Imaging: Renal US  No results found for this or any previous visit. No results found for this or any previous visit. No results found for this or any previous visit. No results found for this or any previous visit. Results for orders placed during the hospital encounter of 03/28/18  US RENAL   Narrative CLINICAL DATA:  Recurrent UTI  EXAM: RENAL / URINARY TRACT ULTRASOUND COMPLETE  COMPARISON:  None  FINDINGS: Right Kidney:  Renal measurements: 9.7 x 3.5 x 5.0 cm = volume: 87.4 mL. Normal cortical thickness. Increased cortical echogenicity. No mass, hydronephrosis or shadowing calcification.  Left Kidney:  Renal measurements: 10.4 x 5.6 x 4.6 cm = volume: 132.4 mL. Normal cortical thickness. Increased cortical echogenicity. Simple appearing cyst at inferior pole 2.9 x 3.2 x 3.1 cm. Tiny cyst at medial aspect of mid upper pole 1.5 x 0.9 x 1.0 cm. No hydronephrosis or shadowing calcification.  Bladder:  Appears normal for degree of bladder distention. BILATERAL ureteral jets seen.  IMPRESSION: Medical renal disease changes of both kidneys.  Two LEFT renal cysts.  Otherwise negative exam.   Electronically Signed   By: Lavonia Dana M.D.   On: 03/28/2018 15:06    No results found for this or any previous visit. No results found for this or any previous visit. No results found for this or any previous  visit.  Assessment & Plan:    1) mixed incontinence, urgency - we discussed the nature r/b of pelvic floor PT, anticholinergics or Myrbetriq. All questions answered.  I gave her Myrbetriq 25 mg samples.  2) bacteriuria - continue TV estrogen and supplements. Imaging of kidneys/bladder normal. She could consider d-mannose.  I discussed with her bacteria in the urine, urine odor and cloudiness are not considered to urinary tract infection and on the contrary bacteria in the urine can be protective against a UTI as mentioned in the most recent ID guidelines.   No follow-ups on file.  Festus Aloe, MD  [facility] [address] [place], [place] [address] [phone]

## 2018-05-14 ENCOUNTER — Telehealth: Payer: Self-pay | Admitting: Urology

## 2018-05-14 NOTE — Telephone Encounter (Signed)
Patient was notified that urine was not sent for culture

## 2018-05-14 NOTE — Telephone Encounter (Signed)
Patient called the office this morning and left a voice mail message requesting information about her UA that was done in the office on 05/09/18 with Dr. Junious Silk.  Patient can be reached at [phone] or [phone].

## 2018-05-19 DIAGNOSIS — Z853 Personal history of malignant neoplasm of breast: Secondary | ICD-10-CM | POA: Diagnosis not present

## 2018-05-19 DIAGNOSIS — Z1231 Encounter for screening mammogram for malignant neoplasm of breast: Secondary | ICD-10-CM | POA: Diagnosis not present

## 2018-05-19 LAB — HM MAMMOGRAPHY

## 2018-05-22 ENCOUNTER — Encounter: Payer: Self-pay | Admitting: Primary Care

## 2018-06-13 ENCOUNTER — Telehealth: Payer: Self-pay | Admitting: Urology

## 2018-06-13 MED ORDER — MIRABEGRON ER 25 MG PO TB24: 25.0000 mg | ORAL_TABLET | Freq: Every day | ORAL | 11 refills | Status: DC

## 2018-06-13 NOTE — Telephone Encounter (Signed)
Patient advised of Rx

## 2018-06-13 NOTE — Addendum Note (Signed)
Addended by: Feliberto Gottron on: 06/13/2018 10:44 AM   Modules accepted: Orders

## 2018-06-13 NOTE — Telephone Encounter (Signed)
Pt needs an Rx for Myrbetriq, she is out of samples and does not have an appt til May. Please advise.

## 2018-06-13 NOTE — Telephone Encounter (Signed)
Myrbetriq 25mg sent to pharmacy.  ?

## 2018-06-18 ENCOUNTER — Ambulatory Visit: Payer: Medicare Other | Admitting: Urology

## 2018-06-23 ENCOUNTER — Telehealth: Payer: Self-pay | Admitting: *Deleted

## 2018-06-23 DIAGNOSIS — K219 Gastro-esophageal reflux disease without esophagitis: Secondary | ICD-10-CM

## 2018-06-23 MED ORDER — FAMOTIDINE 20 MG PO TABS: 20.0000 mg | ORAL_TABLET | Freq: Two times a day (BID) | ORAL | 0 refills | Status: DC

## 2018-06-23 NOTE — Telephone Encounter (Signed)
Patient called stating that she has been taking Zantac for years and just found out that it has been taken off the market. Patient wants you to sent a script in for something else because it will be cheaper than her buying it over the counter. [place]

## 2018-06-23 NOTE — Telephone Encounter (Signed)
Please notify patient that I sent in famotidine (Pepcid) in place of Zantac. She can take it 1-2 times daily as needed for heartburn.

## 2018-06-24 NOTE — Telephone Encounter (Signed)
Spoken and notified patient of Kate Clark's comments. Patient verbalized understanding.  

## 2018-07-08 ENCOUNTER — Other Ambulatory Visit: Payer: Self-pay | Admitting: Urology

## 2018-07-08 NOTE — Telephone Encounter (Signed)
Spoke to patient. She will come pick up samples after lunch

## 2018-07-08 NOTE — Telephone Encounter (Signed)
Pt wants to know if we have anymore Myrbetriq samples we can give her. She doesn't have an appt until 07/23/2018. Please advise.

## 2018-07-23 ENCOUNTER — Ambulatory Visit: Payer: Medicare Other | Admitting: Urology

## 2018-08-04 ENCOUNTER — Other Ambulatory Visit: Payer: Self-pay | Admitting: Urology

## 2018-08-04 NOTE — Telephone Encounter (Signed)
Pt called and wants to know if we can we give her some samples of Myrbetriq 25 mg. Please advise

## 2018-08-04 NOTE — Telephone Encounter (Signed)
Ok to give samples or should we send in a script?

## 2018-08-05 NOTE — Telephone Encounter (Signed)
We can provide Ashlee Lopez with samples of Myrbetriq 25 mg daily until she sees Dr.Eskridge.

## 2018-08-05 NOTE — Telephone Encounter (Signed)
Patient notified samples left up front

## 2018-08-18 ENCOUNTER — Telehealth: Payer: Self-pay

## 2018-08-18 DIAGNOSIS — K219 Gastro-esophageal reflux disease without esophagitis: Secondary | ICD-10-CM

## 2018-08-18 NOTE — Telephone Encounter (Signed)
Landon from [facility] called to let us know famotidine is on backorder and needs a replacement. Many providers have changes to omeprazole or pantoprazole.

## 2018-08-18 NOTE — Telephone Encounter (Signed)
Please notify patient of this information. Is she still experiencing heartburn? Does she feel like she needs an alternative to Pepcid?

## 2018-08-19 NOTE — Telephone Encounter (Signed)
Message left for patient to return my call.  

## 2018-08-21 MED ORDER — OMEPRAZOLE 20 MG PO CPDR: 20.0000 mg | DELAYED_RELEASE_CAPSULE | Freq: Every day | ORAL | 0 refills | Status: DC

## 2018-08-21 NOTE — Telephone Encounter (Signed)
Spoken to patient this morning. She does have some heartburn. She has been taking a lot of Tums. She stated that she would like an alternative for now until Pepcid is available again.

## 2018-08-21 NOTE — Telephone Encounter (Signed)
Please notify patient that I'm send in a prescription for omeprazole 20 mg. Take 1 capsule daily for heartburn.

## 2018-08-22 NOTE — Telephone Encounter (Signed)
Spoken and notified patient of Kate Clark's comments. Patient verbalized understanding.  

## 2018-09-03 ENCOUNTER — Encounter: Payer: Self-pay | Admitting: Urology

## 2018-09-03 ENCOUNTER — Other Ambulatory Visit: Payer: Self-pay

## 2018-09-03 ENCOUNTER — Ambulatory Visit (INDEPENDENT_AMBULATORY_CARE_PROVIDER_SITE_OTHER): Payer: Medicare Other | Admitting: Urology

## 2018-09-03 VITALS — BP 163/81 | HR 76 | Ht 64.0 in | Wt 144.3 lb

## 2018-09-03 DIAGNOSIS — N390 Urinary tract infection, site not specified: Secondary | ICD-10-CM | POA: Diagnosis not present

## 2018-09-03 DIAGNOSIS — R350 Frequency of micturition: Secondary | ICD-10-CM | POA: Diagnosis not present

## 2018-09-03 DIAGNOSIS — R300 Dysuria: Secondary | ICD-10-CM

## 2018-09-03 DIAGNOSIS — N3946 Mixed incontinence: Secondary | ICD-10-CM | POA: Diagnosis not present

## 2018-09-03 LAB — URINALYSIS, COMPLETE
Bilirubin, UA: NEGATIVE
Glucose, UA: NEGATIVE
Ketones, UA: NEGATIVE
Nitrite, UA: NEGATIVE
Protein,UA: NEGATIVE
Specific Gravity, UA: 1.02 (ref 1.005–1.030)
Urobilinogen, Ur: 0.2 mg/dL (ref 0.2–1.0)
pH, UA: 7.5 (ref 5.0–7.5)

## 2018-09-03 LAB — MICROSCOPIC EXAMINATION
Epithelial Cells (non renal): >10 /hpf — AB (ref 0–10)
RBC, Urine: NONE SEEN /hpf (ref 0–2)

## 2018-09-03 NOTE — Progress Notes (Signed)
09/03/2018 10:40 AM   Ashlee Lopez [birth year] [id number]  Referring provider: Pleas Koch, NP [place],  [place] [address]  Chief Complaint  Patient presents with  . Urinary Incontinence    HPI:  Ashlee Lopez is a patient followed for OAB symptoms, mixed incontinence and recurrent UTI.  She has a history of dysuria and mixed incontinence, but also asymptomatic bacteriuria. Low dose abx suppression was discussed but not started. She followed up with PA McGowan and had a normal pelvic exam exhibiting atrophic vaginitis.  PA McGowan put her on probiotics, vitamin C and cranberry supplements as well as transvaginal estrogen.  Renal ultrasound was normal March 28, 2018. PVR has been normal.   She continued to have frequency and urgency. She wears a few ppd due to UUI. She has occasional urine odor and cloudiness. No dysuria. She started Myrbetriq 25 mg. She has nocturia. She has LE edema and hasn't been wearing pressure stocking. No gross hematuria.    PMH: Past Medical History:  Diagnosis Date  . Benign essential HTN 03/21/2011  . Breast cancer, stage 1 ([facility] [place]) 02/10/2003   Right tubular breast cancer  . Cancer ([facility])   . Diverticulosis of colon 03/23/2011  . Family history of breast cancer   . Family history of colon cancer   . Family history of melanoma   . Family history of prostate cancer   . Fibrocystic disease of breast 03/23/2011  . Graves' disease with exophthalmos 03/23/2011  . Hypertension   . Hypothyroid 03/21/2011  . IBS (irritable bowel syndrome) 03/23/2011  . ITP (idiopathic thrombocytopenic purpura) 03/21/2011  . S/P splenectomy 03/21/2011  . 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 Varicose vein of leg 03/23/2011    Surgical History: Past Surgical History:  Procedure Laterality Date  . ABDOMINAL HYSTERECTOMY  2001  . APPENDECTOMY  1941  . BREAST SURGERY  02/17/2003   Mastectomy-Right  .  MASTECTOMY  2004   Dr Margot Chimes  . OOPHORECTOMY     BSO  . SKIN CANCER EXCISION  2019   removal of cancer from ear  . SPLENECTOMY  1986    Home Medications:  Allergies as of 09/03/2018      Reactions   Sulfamethoxazole-trimethoprim Hives   Septra [bactrim]       Medication List       Accurate as of September 03, 2018 10:40 AM. If you have any questions, ask your nurse or doctor.        amLODipine-benazepril 10-20 MG capsule Commonly known as: LOTREL TAKE 1 CAPSULE BY MOUTH ONCE DAILY FOR BLOOD PRESSURE   aspirin 81 MG tablet Take 81 mg by mouth daily.   CALCIUM-VITAMIN D PO Take by mouth.   cephALEXin 500 MG capsule Commonly known as: Keflex Take 1 capsule (500 mg total) by mouth 2 (two) times daily.   citalopram 20 MG tablet Commonly known as: CELEXA Take 1/2 tablet by mouth once daily for anxiety.   conjugated estrogens vaginal cream Commonly known as: Premarin Apply 0.5mg  (pea-sized amount)  just inside the vaginal introitus with a finger-tip on  Monday, Wednesday and Friday nights.   CRANBERRY PO Take 3 tablets by mouth daily.   famotidine 20 MG tablet Commonly known as: Pepcid Take 1 tablet (20 mg total) by mouth 2 (two) times daily. For heartburn.   hydrochlorothiazide 25 MG tablet Commonly known as: HYDRODIURIL Take 1 tablet (25 mg total) by mouth daily. For  blood pressure.   ketoconazole 2 % cream Commonly known as: NIZORAL   levothyroxine 100 MCG tablet Commonly known as: SYNTHROID Take 1 tablet by mouth every morning with water only. No food or medications for 30 minutes.   metoprolol tartrate 50 MG tablet Commonly known as: LOPRESSOR TAKE 1 TABLET BY MOUTH TWICE A DAY   mirabegron ER 25 MG Tb24 tablet Commonly known as: MYRBETRIQ Take 1 tablet (25 mg total) by mouth daily.   MULTI-VITAMIN PO Take by mouth.   nitrofurantoin (macrocrystal-monohydrate) 100 MG capsule Commonly known as: MACROBID Take 1 capsule (100 mg total) by mouth every 12  (twelve) hours.   omeprazole 20 MG capsule Commonly known as: PRILOSEC Take 1 capsule (20 mg total) by mouth daily. For heartburn.   predniSONE 10 MG tablet Commonly known as: DELTASONE   rosuvastatin 5 MG tablet Commonly known as: CRESTOR TAKE 1 TABLET BY MOUTH EVERY OTHER DAY   TYLENOL PM EXTRA STRENGTH PO Take by mouth.       Allergies:  Allergies  Allergen Reactions  . Sulfamethoxazole-Trimethoprim Hives  . Septra [Bactrim]     Family History: Family History  Problem Relation Age of Onset  . Breast cancer Mother        Age 8  . Hypertension Father   . Heart disease Father   . Lung cancer Father   . Breast cancer Sister        Age 62  . Melanoma Sister        dx in her 52s  . Dementia Sister   . Heart disease Maternal Aunt   . Prostate cancer Maternal Uncle   . Colon cancer Maternal Grandfather   . Colon cancer Maternal Uncle   . Prostate cancer Maternal Uncle   . Dementia Maternal Aunt   . Breast cancer Cousin        maternal 2nd cousin  . Breast cancer Cousin        maternal first cousin dx in her 71s  . Melanoma Son        dx in his 66s    Social History:  reports that she has never smoked. She has never used smokeless tobacco. She reports that she does not drink alcohol or use drugs.  ROS:                                        Physical Exam: There were no vitals taken for this visit.  Constitutional:  Alert and oriented, No acute distress. HEENT: [place] AT, moist mucus membranes.  Trachea midline, no masses. Cardiovascular: No clubbing, cyanosis, or edema. Respiratory: Normal respiratory effort, no increased work of breathing. GI: Abdomen is soft, nontender, nondistended, no abdominal masses GU: No CVA tenderness Skin: No rashes, bruises or suspicious lesions. Neurologic: Grossly intact, no focal deficits, moving all 4 extremities. Psychiatric: Normal mood and affect.  Laboratory Data: Lab Results  Component Value  Date   WBC 6.5 11/21/2016   HGB 13.8 11/21/2016   HCT 39.2 11/21/2016   MCV 90 11/21/2016   PLT 323 11/21/2016    Lab Results  Component Value Date   CREATININE 0.66 12/04/2017    No results found for: PSA  No results found for: TESTOSTERONE  Lab Results  Component Value Date   HGBA1C 5.6 12/04/2017    Urinalysis    Component Value Date/Time   APPEARANCEUR Cloudy (A) 05/09/2018 1236  GLUCOSEU Negative 05/09/2018 1236   BILIRUBINUR Negative 05/09/2018 1236   PROTEINUR Negative 05/09/2018 1236   UROBILINOGEN 0.2 12/24/2017 0830   NITRITE Negative 05/09/2018 1236   LEUKOCYTESUR 2+ (A) 05/09/2018 1236    Lab Results  Component Value Date   LABMICR See below: 05/09/2018   WBCUA >30 (H) 05/09/2018   RBCUA 0-2 05/09/2018   LABEPIT >10 (H) 05/09/2018   MUCUS Present (A) 05/09/2018   BACTERIA Many (A) 05/09/2018    Pertinent Imaging: n/a No results found for this or any previous visit. No results found for this or any previous visit. No results found for this or any previous visit. No results found for this or any previous visit. Results for orders placed during the hospital encounter of 03/28/18  US RENAL   Narrative CLINICAL DATA:  Recurrent UTI  EXAM: RENAL / URINARY TRACT ULTRASOUND COMPLETE  COMPARISON:  None  FINDINGS: Right Kidney:  Renal measurements: 9.7 x 3.5 x 5.0 cm = volume: 87.4 mL. Normal cortical thickness. Increased cortical echogenicity. No mass, hydronephrosis or shadowing calcification.  Left Kidney:  Renal measurements: 10.4 x 5.6 x 4.6 cm = volume: 132.4 mL. Normal cortical thickness. Increased cortical echogenicity. Simple appearing cyst at inferior pole 2.9 x 3.2 x 3.1 cm. Tiny cyst at medial aspect of mid upper pole 1.5 x 0.9 x 1.0 cm. No hydronephrosis or shadowing calcification.  Bladder:  Appears normal for degree of bladder distention. BILATERAL ureteral jets seen.  IMPRESSION: Medical renal disease changes of both  kidneys.  Two LEFT renal cysts.  Otherwise negative exam.   Electronically Signed   By: Lavonia Dana M.D.   On: 03/28/2018 15:06    No results found for this or any previous visit. No results found for this or any previous visit. No results found for this or any previous visit.  Assessment & Plan:    Frequency, nocturia - wear TED hose, elevate legs. Timed voiding. Continue Myrbetriq. We also discussed generic OAB meds or PTNS.   Dysuria - continue TV estrogen - doing well.    No follow-ups on file.  Festus Aloe, MD  [facility] [address] [place], [place] [address] [phone]

## 2018-09-10 ENCOUNTER — Other Ambulatory Visit: Payer: Self-pay | Admitting: Primary Care

## 2018-09-10 DIAGNOSIS — I10 Essential (primary) hypertension: Secondary | ICD-10-CM

## 2018-10-21 ENCOUNTER — Telehealth: Payer: Self-pay | Admitting: Primary Care

## 2018-10-21 NOTE — Telephone Encounter (Signed)
Patient lost her social securtity card and she needs to get a replacement.  Patient needs a letter from [facility] stating patient's name Ashlee Lopez and address- [address] [place], [place] [address] and telephone number [phone] and date of last visit. The letter needs to be dated the date it's written. Patient is requesting letter be mailed to her.

## 2018-10-22 NOTE — Telephone Encounter (Signed)
Mailed as requested.

## 2018-10-22 NOTE — Telephone Encounter (Signed)
Noted. I cannot confirm that she lives at this address but I will put her listed address and phone number in her letter. Letter printed and placed in Chan's inbox.

## 2018-11-10 ENCOUNTER — Other Ambulatory Visit: Payer: Self-pay | Admitting: Primary Care

## 2018-11-10 DIAGNOSIS — K219 Gastro-esophageal reflux disease without esophagitis: Secondary | ICD-10-CM

## 2018-11-19 ENCOUNTER — Telehealth: Payer: Self-pay | Admitting: Urology

## 2018-11-19 ENCOUNTER — Other Ambulatory Visit: Payer: Self-pay

## 2018-11-19 ENCOUNTER — Encounter: Payer: Self-pay | Admitting: Physician Assistant

## 2018-11-19 ENCOUNTER — Ambulatory Visit (INDEPENDENT_AMBULATORY_CARE_PROVIDER_SITE_OTHER): Payer: Medicare Other | Admitting: Physician Assistant

## 2018-11-19 VITALS — BP 169/83 | HR 82 | Wt 145.9 lb

## 2018-11-19 DIAGNOSIS — R350 Frequency of micturition: Secondary | ICD-10-CM | POA: Diagnosis not present

## 2018-11-19 DIAGNOSIS — N3281 Overactive bladder: Secondary | ICD-10-CM | POA: Diagnosis not present

## 2018-11-19 DIAGNOSIS — N390 Urinary tract infection, site not specified: Secondary | ICD-10-CM

## 2018-11-19 LAB — URINALYSIS, COMPLETE
Bilirubin, UA: NEGATIVE
Glucose, UA: NEGATIVE
Ketones, UA: NEGATIVE
Nitrite, UA: POSITIVE — AB
Protein,UA: NEGATIVE
Specific Gravity, UA: 1.015 (ref 1.005–1.030)
Urobilinogen, Ur: 0.2 mg/dL (ref 0.2–1.0)
pH, UA: 7 (ref 5.0–7.5)

## 2018-11-19 LAB — MICROSCOPIC EXAMINATION
RBC, Urine: NONE SEEN /hpf (ref 0–2)
WBC, UA: >30 /hpf — AB (ref 0–5)

## 2018-11-19 MED ORDER — NITROFURANTOIN MONOHYD MACRO 100 MG PO CAPS: 100.0000 mg | ORAL_CAPSULE | Freq: Two times a day (BID) | ORAL | 0 refills | Status: AC

## 2018-11-19 NOTE — Progress Notes (Signed)
11/19/2018 8:08 AM   Ashlee Lopez September 19, 1933 [id number]  CC: Frequency, dark urine  HPI: Ashlee Lopez is a 85 y.o. female who presents today for evaluation of possible UTI. She is an established [facility] patient who last saw Dr. Junious Silk on 09/03/2018 for follow-up of OAB with mixed incontinence and recurrent UTI.  She reports a 3-week history of urinary frequency and dark urine. She denies dysuria, urgency, gross hematuria, and flank pain.  She does have a history of recurrent UTI, most recently with positive urine culture in January 2020. She is not on daily antibiotic prophylaxis.  She is on daily cranberry supplements and Premarin cream for recurrent UTI prevention.  In-office UA today positive for trace-lysed blood, nitrites, and 2+ leukocyte esterase; urine microscopy with >30 WBCs/HPF and many bacteria.  PMH: Past Medical History:  Diagnosis Date  . Benign essential HTN 03/21/2011  . Breast cancer, stage 1 ([facility]) 02/10/2003   Right tubular breast cancer  . Cancer ([place])   . Diverticulosis of colon 03/23/2011  . Family history of breast cancer   . Family history of colon cancer   . Family history of melanoma   . Family history of prostate cancer   . Fibrocystic disease of breast 03/23/2011  . Graves' disease with exophthalmos 03/23/2011  . Hypertension   . Hypothyroid 03/21/2011  . IBS (irritable bowel syndrome) 03/23/2011  . ITP (idiopathic thrombocytopenic purpura) 03/21/2011  . S/P splenectomy 03/21/2011  . Uterus cancer ([facility]) 03/23/1999   Well differentiated AdenoCA of endometrium-superficially confined  . Varicose vein of leg 03/23/2011    Surgical History: Past Surgical History:  Procedure Laterality Date  . ABDOMINAL HYSTERECTOMY  2001  . APPENDECTOMY  1941  . BREAST SURGERY  02/17/2003   Mastectomy-Right  . MASTECTOMY  2004   Dr Margot Chimes  . OOPHORECTOMY     BSO  . SKIN CANCER EXCISION  2019   removal of cancer from ear  . SPLENECTOMY  1986    Home Medications:  Allergies  as of 11/19/2018      Reactions   Sulfamethoxazole-trimethoprim Hives   Septra [bactrim]       Medication List       Accurate as of November 19, 2018 11:59 PM. If you have any questions, ask your nurse or doctor.        amLODipine-benazepril 10-20 MG capsule Commonly known as: LOTREL TAKE 1 CAPSULE BY MOUTH ONCE DAILY FOR BLOOD PRESSURE   aspirin 81 MG tablet Take 81 mg by mouth daily.   CALCIUM-VITAMIN D PO Take by mouth.   citalopram 20 MG tablet Commonly known as: CELEXA Take 1/2 tablet by mouth once daily for anxiety.   conjugated estrogens vaginal cream Commonly known as: Premarin Apply 0.5mg  (pea-sized amount)  just inside the vaginal introitus with a finger-tip on  Monday, Wednesday and Friday nights.   CRANBERRY PO Take 3 tablets by mouth daily.   famotidine 20 MG tablet Commonly known as: Pepcid Take 1 tablet (20 mg total) by mouth 2 (two) times daily. For heartburn.   hydrochlorothiazide 25 MG tablet Commonly known as: HYDRODIURIL Take 1 tablet (25 mg total) by mouth daily. For blood pressure.   ketoconazole 2 % cream Commonly known as: NIZORAL   levothyroxine 100 MCG tablet Commonly known as: SYNTHROID Take 1 tablet by mouth every morning with water only. No food or medications for 30 minutes.   metoprolol tartrate 50 MG tablet Commonly known as: LOPRESSOR TAKE 1 TABLET BY MOUTH TWICE (2)  DAILY   mirabegron ER 25 MG Tb24 tablet Commonly known as: MYRBETRIQ Take 1 tablet (25 mg total) by mouth daily.   MULTI-VITAMIN PO Take by mouth.   nitrofurantoin (macrocrystal-monohydrate) 100 MG capsule Commonly known as: MACROBID Take 1 capsule (100 mg total) by mouth every 12 (twelve) hours for 5 days.   omeprazole 20 MG capsule Commonly known as: PRILOSEC TAKE 1 CAPSULE BY MOUTH ONCE DAILY FOR HEARTBURN.   predniSONE 10 MG tablet Commonly known as: DELTASONE   rosuvastatin 5 MG tablet Commonly known as: CRESTOR TAKE 1 TABLET BY MOUTH EVERY OTHER  DAY   TYLENOL PM EXTRA STRENGTH PO Take by mouth.       Allergies:  Allergies  Allergen Reactions  . Sulfamethoxazole-Trimethoprim Hives  . Septra [Bactrim]     Family History: Family History  Problem Relation Age of Onset  . Breast cancer Mother        Age 52  . Hypertension Father   . Heart disease Father   . Lung cancer Father   . Breast cancer Sister        Age 56  . Melanoma Sister        dx in her 48s  . Dementia Sister   . Heart disease Maternal Aunt   . Prostate cancer Maternal Uncle   . Colon cancer Maternal Grandfather   . Colon cancer Maternal Uncle   . Prostate cancer Maternal Uncle   . Dementia Maternal Aunt   . Breast cancer Cousin        maternal 2nd cousin  . Breast cancer Cousin        maternal first cousin dx in her 44s  . Melanoma Son        dx in his 79s    Social History:   reports that she has never smoked. She has never used smokeless tobacco. She reports that she does not drink alcohol or use drugs.  ROS: UROLOGY Frequent Urination?: Yes Hard to postpone urination?: No Burning/pain with urination?: No Get up at night to urinate?: Yes Leakage of urine?: Yes Urine stream starts and stops?: No Trouble starting stream?: No Do you have to strain to urinate?: No Blood in urine?: No Urinary tract infection?: Yes Sexually transmitted disease?: No Injury to kidneys or bladder?: No Painful intercourse?: No Weak stream?: No Currently pregnant?: No Vaginal bleeding?: No Last menstrual period?: n  Gastrointestinal Nausea?: No Vomiting?: No Indigestion/heartburn?: No Diarrhea?: Yes Constipation?: No  Constitutional Fever: No Night sweats?: No Weight loss?: No Fatigue?: No  Skin Skin rash/lesions?: No Itching?: Yes  Eyes Blurred vision?: Yes Double vision?: No  Ears/Nose/Throat Sore throat?: No Sinus problems?: Yes  Hematologic/Lymphatic Swollen glands?: No Easy bruising?: Yes  Cardiovascular Leg swelling?: Yes  Chest pain?: No  Respiratory Cough?: Yes Shortness of breath?: No  Endocrine Excessive thirst?: No  Musculoskeletal Back pain?: Yes Joint pain?: Yes  Neurological Headaches?: No Dizziness?: No  Psychologic Depression?: No Anxiety?: Yes  Physical Exam: BP (!) 169/83   Pulse 82   Wt 145 lb 14.4 oz (66.2 kg)   BMI 25.04 kg/m   Constitutional:  Alert and oriented, no acute distress, nontoxic appearing HEENT: [place], AT Cardiovascular: No clubbing, cyanosis, or edema Respiratory: Normal respiratory effort, no increased work of breathing Skin: No rashes, bruises or suspicious lesions Neurologic: Grossly intact, no focal deficits, moving all 4 extremities Psychiatric: Normal mood and affect  Laboratory Data: Results for orders placed or performed in visit on 11/19/18  Microscopic Examination  URINE  Result Value Ref Range   WBC, UA >30 (A) 0 - 5 /hpf   RBC None seen 0 - 2 /hpf   Epithelial Cells (non renal) 0-10 0 - 10 /hpf   Bacteria, UA Many (A) None seen/Few  Urinalysis, Complete  Result Value Ref Range   Specific Gravity, UA 1.015 1.005 - 1.030   pH, UA 7.0 5.0 - 7.5   Color, UA Yellow Yellow   Appearance Ur Cloudy (A) Clear   Leukocytes,UA 2+ (A) Negative   Protein,UA Negative Negative/Trace   Glucose, UA Negative Negative   Ketones, UA Negative Negative   RBC, UA Trace (A) Negative   Bilirubin, UA Negative Negative   Urobilinogen, Ur 0.2 0.2 - 1.0 mg/dL   Nitrite, UA Positive (A) Negative   Microscopic Examination See below:    Assessment & Plan:   1. Frequency of micturition Patient with symptoms and UA suggestive of acute UTI.  Will send for culture. -Urinalysis, Complete -Urine culture -Nitrofurantoin 100mg  BID x5 days  2.  Recurrent UTI Most recent positive urine culture from January 2020.  She does not report any other infections in the interim.  She seems well managed on a regimen of Premarin cream and cranberry supplementation.  She does report  taking cranberry supplements at lunchtime.  I counseled her that taking the supplements with food may cancel out the beneficial effects.  I advised her to start taking these supplements on an empty stomach to increase acidification of the urine.  She expressed an understanding of this plan. -Take cranberry supplement at night on an empty stomach  3. OAB Patient has run out of Myrbetriq 25mg . Samples provided today.  Debroah Loop, PA-C  [facility] [address] [place], [place] [address] [phone]

## 2018-11-19 NOTE — Patient Instructions (Signed)
1. Continue using Premarin cream three times weekly. 2. Start taking your cranberry supplements 1-2 times daily on an empty stomach, e.g. late at night before bed.

## 2018-11-21 LAB — CULTURE, URINE COMPREHENSIVE

## 2018-12-04 ENCOUNTER — Other Ambulatory Visit: Payer: Self-pay | Admitting: Primary Care

## 2018-12-04 ENCOUNTER — Other Ambulatory Visit: Payer: Self-pay

## 2018-12-04 ENCOUNTER — Encounter: Payer: Self-pay | Admitting: Physician Assistant

## 2018-12-04 ENCOUNTER — Ambulatory Visit (INDEPENDENT_AMBULATORY_CARE_PROVIDER_SITE_OTHER): Payer: Medicare Other | Admitting: Physician Assistant

## 2018-12-04 VITALS — BP 175/84 | HR 77 | Ht 64.0 in | Wt 146.4 lb

## 2018-12-04 DIAGNOSIS — R351 Nocturia: Secondary | ICD-10-CM

## 2018-12-04 DIAGNOSIS — N3000 Acute cystitis without hematuria: Secondary | ICD-10-CM | POA: Diagnosis not present

## 2018-12-04 DIAGNOSIS — R350 Frequency of micturition: Secondary | ICD-10-CM

## 2018-12-04 DIAGNOSIS — E039 Hypothyroidism, unspecified: Secondary | ICD-10-CM

## 2018-12-04 DIAGNOSIS — E785 Hyperlipidemia, unspecified: Secondary | ICD-10-CM

## 2018-12-04 DIAGNOSIS — I10 Essential (primary) hypertension: Secondary | ICD-10-CM

## 2018-12-04 LAB — URINALYSIS, COMPLETE
Bilirubin, UA: NEGATIVE
Glucose, UA: NEGATIVE
Ketones, UA: NEGATIVE
Nitrite, UA: NEGATIVE
Protein,UA: NEGATIVE
Specific Gravity, UA: 1.02 (ref 1.005–1.030)
Urobilinogen, Ur: 0.2 mg/dL (ref 0.2–1.0)
pH, UA: 7 (ref 5.0–7.5)

## 2018-12-04 LAB — MICROSCOPIC EXAMINATION: WBC, UA: >30 /hpf — AB (ref 0–5)

## 2018-12-04 MED ORDER — CIPROFLOXACIN HCL 500 MG PO TABS: 500.0000 mg | ORAL_TABLET | Freq: Two times a day (BID) | ORAL | 0 refills | Status: AC

## 2018-12-04 NOTE — Progress Notes (Signed)
12/04/2018 8:06 AM   [name] 1933/04/16 [id number]  CC: Frequency, urinary odor  HPI: Ashlee Lopez is a 85 y.o. female who presents today for evaluation of possible UTI. She is an established [facility] patient who last saw me on 11/19/2018 for UTI. Urine culture with K pneumoniae, treated with nitrofurantoin 100mg  BID x5 days.  She states her symptoms improved slightly with antibiotics but is now reporting frequency and urinary odor. She is concerned that she was inadequately treated for her last infection because she normally receives antibiotics for more than 5 days and at a larger dose than 100mg  BID.   She states that she is "an anxious person," particularly right now due to her sister being moved to a new location in her nursing home over the weekend and her being unable to visit. Unable to urinate today despite drinking at least 7 cups of water since arrival in the clinic, successfully catheterized for UA specimen (estimated volume 164mL).  She describes worsened urinary frequency at night. She does not know if she has sleep apnea, but she states she does not sleep well.  She does have a history of recurrent UTI. She is not on daily antibiotic prophylaxis. She is on daily cranberry supplements and Premarin cream.  In-office UA today positive for trace-intact blood and 1+ leukocyte esterase; urine microscopy with >30 WBCs/HPF and many bacteria.   PMH: Past Medical History:  Diagnosis Date  . Benign essential HTN 03/21/2011  . Breast cancer, stage 1 ([place]) 02/10/2003   Right tubular breast cancer  . Cancer ([place])   . Diverticulosis of colon 03/23/2011  . Family history of breast cancer   . Family history of colon cancer   . Family history of melanoma   . Family history of prostate cancer   . Fibrocystic disease of breast 03/23/2011  . Graves' disease with exophthalmos 03/23/2011  . Hypertension   . Hypothyroid 03/21/2011  . IBS (irritable bowel syndrome) 03/23/2011  . ITP (idiopathic  thrombocytopenic purpura) 03/21/2011  . S/P splenectomy 03/21/2011  . Uterus cancer ([place]) 03/23/1999   Well differentiated AdenoCA of endometrium-superficially confined  . Varicose vein of leg 03/23/2011    Surgical History: Past Surgical History:  Procedure Laterality Date  . ABDOMINAL HYSTERECTOMY  2001  . APPENDECTOMY  1941  . BREAST SURGERY  02/17/2003   Mastectomy-Right  . MASTECTOMY  2004   Dr Margot Chimes  . OOPHORECTOMY     BSO  . SKIN CANCER EXCISION  2019   removal of cancer from ear  . SPLENECTOMY  1986    Home Medications:  Allergies as of 12/04/2018      Reactions   Sulfamethoxazole-trimethoprim Hives   Septra [bactrim]       Medication List       Accurate as of December 04, 2018 11:59 PM. If you have any questions, ask your nurse or doctor.        amLODipine-benazepril 10-20 MG capsule Commonly known as: LOTREL TAKE 1 CAPSULE BY MOUTH ONCE DAILY FOR BLOOD PRESSURE   aspirin 81 MG tablet Take 81 mg by mouth daily.   CALCIUM-VITAMIN D PO Take by mouth.   ciprofloxacin 500 MG tablet Commonly known as: Cipro Take 1 tablet (500 mg total) by mouth 2 (two) times daily for 7 days. Started by: Debroah Loop, PA-C   citalopram 20 MG tablet Commonly known as: CELEXA Take 1/2 tablet by mouth once daily for anxiety.   conjugated estrogens vaginal cream Commonly known as: Premarin  Apply 0.5mg  (pea-sized amount)  just inside the vaginal introitus with a finger-tip on  Monday, Wednesday and Friday nights.   CRANBERRY PO Take 3 tablets by mouth daily.   famotidine 20 MG tablet Commonly known as: Pepcid Take 1 tablet (20 mg total) by mouth 2 (two) times daily. For heartburn.   hydrochlorothiazide 25 MG tablet Commonly known as: HYDRODIURIL Take 1 tablet (25 mg total) by mouth daily. For blood pressure.   ketoconazole 2 % cream Commonly known as: NIZORAL   levothyroxine 100 MCG tablet Commonly known as: SYNTHROID Take 1 tablet by mouth every morning  with water only. No food or medications for 30 minutes.   metoprolol tartrate 50 MG tablet Commonly known as: LOPRESSOR TAKE 1 TABLET BY MOUTH TWICE (2) DAILY   mirabegron ER 25 MG Tb24 tablet Commonly known as: MYRBETRIQ Take 1 tablet (25 mg total) by mouth daily.   MULTI-VITAMIN PO Take by mouth.   omeprazole 20 MG capsule Commonly known as: PRILOSEC TAKE 1 CAPSULE BY MOUTH ONCE DAILY FOR HEARTBURN.   predniSONE 10 MG tablet Commonly known as: DELTASONE   rosuvastatin 5 MG tablet Commonly known as: CRESTOR TAKE 1 TABLET BY MOUTH EVERY OTHER DAY   TYLENOL PM EXTRA STRENGTH PO Take by mouth.       Allergies:  Allergies  Allergen Reactions  . Sulfamethoxazole-Trimethoprim Hives  . Septra [Bactrim]     Family History: Family History  Problem Relation Age of Onset  . Breast cancer Mother        Age 44  . Hypertension Father   . Heart disease Father   . Lung cancer Father   . Breast cancer Sister        Age 33  . Melanoma Sister        dx in her 66s  . Dementia Sister   . Heart disease Maternal Aunt   . Prostate cancer Maternal Uncle   . Colon cancer Maternal Grandfather   . Colon cancer Maternal Uncle   . Prostate cancer Maternal Uncle   . Dementia Maternal Aunt   . Breast cancer Cousin        maternal 2nd cousin  . Breast cancer Cousin        maternal first cousin dx in her 21s  . Melanoma Son        dx in his 26s    Social History:   reports that she has never smoked. She has never used smokeless tobacco. She reports that she does not drink alcohol or use drugs.  ROS: UROLOGY Frequent Urination?: Yes Hard to postpone urination?: Yes Burning/pain with urination?: No Get up at night to urinate?: Yes Leakage of urine?: No Urine stream starts and stops?: No Trouble starting stream?: No Do you have to strain to urinate?: No Blood in urine?: No Urinary tract infection?: Yes Sexually transmitted disease?: No Injury to kidneys or bladder?: No  Painful intercourse?: No Weak stream?: No Currently pregnant?: No Vaginal bleeding?: No Last menstrual period?: n  Gastrointestinal Nausea?: Yes Vomiting?: No Indigestion/heartburn?: No Diarrhea?: No Constipation?: No  Constitutional Fever: No Night sweats?: No Weight loss?: No Fatigue?: No  Skin Skin rash/lesions?: No Itching?: Yes  Eyes Blurred vision?: No Double vision?: No  Ears/Nose/Throat Sore throat?: No Sinus problems?: Yes  Hematologic/Lymphatic Swollen glands?: No Easy bruising?: Yes  Cardiovascular Leg swelling?: Yes Chest pain?: No  Respiratory Cough?: Yes Shortness of breath?: No  Endocrine Excessive thirst?: No  Musculoskeletal Back pain?: Yes Joint pain?: No  Neurological Headaches?: No Dizziness?: No  Psychologic Depression?: No Anxiety?: Yes  Physical Exam: BP (!) 175/84 (BP Location: Left Arm, Patient Position: Sitting, Cuff Size: Normal)   Pulse 77   Ht 5\' 4"  (1.626 m)   Wt 146 lb 6.4 oz (66.4 kg)   BMI 25.13 kg/m   Constitutional:  Alert and oriented, no acute distress, nontoxic appearing HEENT: [place], AT Cardiovascular: No clubbing, cyanosis. Slight BLE edema. Respiratory: Normal respiratory effort, no increased work of breathing Skin: No rashes, bruises or suspicious lesions Neurologic: Grossly intact, no focal deficits, moving all 4 extremities Psychiatric: Anxious  Laboratory Data: Results for orders placed or performed in visit on 12/04/18  Microscopic Examination   URINE  Result Value Ref Range   WBC, UA >30 (A) 0 - 5 /hpf   RBC 0-2 0 - 2 /hpf   Epithelial Cells (non renal) 0-10 0 - 10 /hpf   Bacteria, UA Many (A) None seen/Few  Urinalysis, Complete  Result Value Ref Range   Specific Gravity, UA 1.020 1.005 - 1.030   pH, UA 7.0 5.0 - 7.5   Color, UA Yellow Yellow   Appearance Ur Cloudy (A) Clear   Leukocytes,UA 1+ (A) Negative   Protein,UA Negative Negative/Trace   Glucose, UA Negative Negative    Ketones, UA Negative Negative   RBC, UA Trace (A) Negative   Bilirubin, UA Negative Negative   Urobilinogen, Ur 0.2 0.2 - 1.0 mg/dL   Nitrite, UA Negative Negative   Microscopic Examination See below:    Assessment & Plan:   1. Urinary frequency Patient reports frequency, consistent with prior UTI. UA concerning for infection with a cath sample today. Will send for culture. Not concerned for retention given low cath output. - Urinalysis, Complete - CULTURE, URINE COMPREHENSIVE  2. Acute cystitis without hematuria Will treat today. Counseled patient to stay well hydrated. - ciprofloxacin (CIPRO) 500 MG tablet; Take 1 tablet (500 mg total) by mouth 2 (two) times daily for 7 days.  Dispense: 14 tablet; Refill: 0  3. Nocturia Patient has BLE edema and reports poor sleep, stating her kids have reported that she snores. I explained the role of lower extremity edema and sleep apnea and nocturia.  I counseled her to elevate her legs throughout the day and consider wearing compression stockings to reduce accumulation of fluid in her lower extremities.  She expressed understanding of this plan.  I also counseled her to discuss possible sleep study for evaluation of sleep apnea with her primary care provider.  She states she has an appointment next week.  Will defer to PCP for evaluation of this.  Debroah Loop, PA-C  [facility] [address] [place], [place] [address] [phone]

## 2018-12-07 LAB — CULTURE, URINE COMPREHENSIVE

## 2018-12-10 ENCOUNTER — Ambulatory Visit: Payer: Self-pay

## 2018-12-10 ENCOUNTER — Other Ambulatory Visit (INDEPENDENT_AMBULATORY_CARE_PROVIDER_SITE_OTHER): Payer: Medicare Other

## 2018-12-10 DIAGNOSIS — E039 Hypothyroidism, unspecified: Secondary | ICD-10-CM | POA: Diagnosis not present

## 2018-12-10 DIAGNOSIS — I10 Essential (primary) hypertension: Secondary | ICD-10-CM

## 2018-12-10 DIAGNOSIS — E785 Hyperlipidemia, unspecified: Secondary | ICD-10-CM

## 2018-12-11 ENCOUNTER — Other Ambulatory Visit: Payer: Self-pay | Admitting: Primary Care

## 2018-12-11 LAB — TSH: TSH: 4.46 u[IU]/mL (ref 0.450–4.500)

## 2018-12-11 LAB — COMPREHENSIVE METABOLIC PANEL
ALT: 18 IU/L (ref 0–32)
AST: 29 IU/L (ref 0–40)
Albumin/Globulin Ratio: 1.6 (ref 1.2–2.2)
Albumin: 4.2 g/dL (ref 3.6–4.6)
Alkaline Phosphatase: 69 IU/L (ref 39–117)
BUN/Creatinine Ratio: 20 (ref 12–28)
BUN: 15 mg/dL (ref 8–27)
Bilirubin Total: 0.5 mg/dL (ref 0.0–1.2)
CO2: 25 mmol/L (ref 20–29)
Calcium: 9.4 mg/dL (ref 8.7–10.3)
Chloride: 96 mmol/L (ref 96–106)
Creatinine, Ser: 0.74 mg/dL (ref 0.57–1.00)
GFR calc Af Amer: 87 mL/min/{1.73_m2} (ref >59)
GFR calc non Af Amer: 75 mL/min/{1.73_m2} (ref >59)
Globulin, Total: 2.7 g/dL (ref 1.5–4.5)
Glucose: 108 mg/dL — ABNORMAL HIGH (ref 65–99)
Potassium: 3.6 mmol/L (ref 3.5–5.2)
Sodium: 134 mmol/L (ref 134–144)
Total Protein: 6.9 g/dL (ref 6.0–8.5)

## 2018-12-11 LAB — LIPID PANEL
Chol/HDL Ratio: 2.5 ratio (ref 0.0–4.4)
Cholesterol, Total: 157 mg/dL (ref 100–199)
HDL: 64 mg/dL (ref >39)
LDL Chol Calc (NIH): 78 mg/dL (ref 0–99)
Triglycerides: 80 mg/dL (ref 0–149)
VLDL Cholesterol Cal: 15 mg/dL (ref 5–40)

## 2018-12-11 LAB — CBC
Hematocrit: 37.2 % (ref 34.0–46.6)
Hemoglobin: 12.5 g/dL (ref 11.1–15.9)
MCH: 31.9 pg (ref 26.6–33.0)
MCHC: 33.6 g/dL (ref 31.5–35.7)
MCV: 95 fL (ref 79–97)
Platelets: 285 10*3/uL (ref 150–450)
RBC: 3.92 x10E6/uL (ref 3.77–5.28)
RDW: 12.9 % (ref 11.7–15.4)
WBC: 5.3 10*3/uL (ref 3.4–10.8)

## 2018-12-15 ENCOUNTER — Other Ambulatory Visit: Payer: Self-pay | Admitting: Primary Care

## 2018-12-15 DIAGNOSIS — E039 Hypothyroidism, unspecified: Secondary | ICD-10-CM

## 2018-12-15 DIAGNOSIS — I10 Essential (primary) hypertension: Secondary | ICD-10-CM

## 2018-12-17 ENCOUNTER — Encounter: Payer: Self-pay | Admitting: *Deleted

## 2018-12-17 ENCOUNTER — Ambulatory Visit (INDEPENDENT_AMBULATORY_CARE_PROVIDER_SITE_OTHER): Payer: Medicare Other | Admitting: Primary Care

## 2018-12-17 ENCOUNTER — Other Ambulatory Visit: Payer: Self-pay

## 2018-12-17 ENCOUNTER — Telehealth: Payer: Self-pay | Admitting: *Deleted

## 2018-12-17 ENCOUNTER — Encounter: Payer: Self-pay | Admitting: Primary Care

## 2018-12-17 VITALS — BP 158/80 | HR 76 | Temp 97.7°F | Ht 64.0 in | Wt 144.5 lb

## 2018-12-17 DIAGNOSIS — F411 Generalized anxiety disorder: Secondary | ICD-10-CM | POA: Diagnosis not present

## 2018-12-17 DIAGNOSIS — Z0000 Encounter for general adult medical examination without abnormal findings: Secondary | ICD-10-CM

## 2018-12-17 DIAGNOSIS — R739 Hyperglycemia, unspecified: Secondary | ICD-10-CM | POA: Diagnosis not present

## 2018-12-17 DIAGNOSIS — E785 Hyperlipidemia, unspecified: Secondary | ICD-10-CM | POA: Diagnosis not present

## 2018-12-17 DIAGNOSIS — D693 Immune thrombocytopenic purpura: Secondary | ICD-10-CM | POA: Diagnosis not present

## 2018-12-17 DIAGNOSIS — E039 Hypothyroidism, unspecified: Secondary | ICD-10-CM

## 2018-12-17 DIAGNOSIS — Z23 Encounter for immunization: Secondary | ICD-10-CM

## 2018-12-17 DIAGNOSIS — K589 Irritable bowel syndrome without diarrhea: Secondary | ICD-10-CM

## 2018-12-17 DIAGNOSIS — Z853 Personal history of malignant neoplasm of breast: Secondary | ICD-10-CM | POA: Diagnosis not present

## 2018-12-17 DIAGNOSIS — N390 Urinary tract infection, site not specified: Secondary | ICD-10-CM | POA: Diagnosis not present

## 2018-12-17 DIAGNOSIS — R05 Cough: Secondary | ICD-10-CM | POA: Diagnosis not present

## 2018-12-17 DIAGNOSIS — I10 Essential (primary) hypertension: Secondary | ICD-10-CM | POA: Diagnosis not present

## 2018-12-17 DIAGNOSIS — R053 Chronic cough: Secondary | ICD-10-CM

## 2018-12-17 LAB — POCT GLYCOSYLATED HEMOGLOBIN (HGB A1C): Hemoglobin A1C: 5.7 % — AB (ref 4.0–5.6)

## 2018-12-17 MED ORDER — AMLODIPINE BESYLATE 10 MG PO TABS: 10.0000 mg | ORAL_TABLET | Freq: Every day | ORAL | 3 refills | Status: DC

## 2018-12-17 MED ORDER — ZOSTER VAC RECOMB ADJUVANTED 50 MCG/0.5ML IM SUSR: 0.5000 mL | Freq: Once | INTRAMUSCULAR | 1 refills | Status: AC

## 2018-12-17 MED ORDER — LOSARTAN POTASSIUM 50 MG PO TABS: 50.0000 mg | ORAL_TABLET | Freq: Every day | ORAL | 0 refills | Status: DC

## 2018-12-17 NOTE — Assessment & Plan Note (Signed)
Compliant to premarin cream. Little improvement with Myrbetriq. Following with Urology.

## 2018-12-17 NOTE — Patient Instructions (Signed)
Stop taking amlodipine-benazepril for blood pressure.  Start taking amlodipine 10 mg once daily for blood pressure. Start taking losartan 50 mg once daily for blood pressure.  Start exercising. You should be getting 150 minutes of exercise weekly.  It's important to improve your diet by reducing consumption of fast food, fried food, processed snack foods, sugary drinks. Increase consumption of fresh vegetables and fruits, whole grains, water.  Ensure you are drinking 64 ounces of water daily.  Schedule a follow up visit with me for 2 weeks for blood pressure check.  It was a pleasure to see you today!

## 2018-12-17 NOTE — Assessment & Plan Note (Signed)
Recent LDL at goal. Continue Crestor.

## 2018-12-17 NOTE — Assessment & Plan Note (Signed)
Intermittent, stable overall. Continue to monitor.

## 2018-12-17 NOTE — Assessment & Plan Note (Signed)
Immunizations UTD, Rx for Shingrix printed. Mammogram UTD, bone density scan UTD. No need for colonoscopy due to age. Encouraged a healthy diet, regular exercise. All recommendations provided at end of visit.   I have personally reviewed and have noted: 1. The patient's medical and social history 2. Their use of alcohol, tobacco or illicit drugs 3. Their current medications and supplements 4. The patient's functional ability including ADL's, fall risks, home  safety risks and hearing or visual impairment. 5. Diet and physical activities 6. Evidence for depression or mood disorder

## 2018-12-17 NOTE — Assessment & Plan Note (Signed)
Recent CBC unremarkable. Exam today unremarkable.

## 2018-12-17 NOTE — Assessment & Plan Note (Signed)
Chronic, no longer taking citalopram as she doesn't wish to take. Overall doing well off medications, continue to monitor.

## 2018-12-17 NOTE — Assessment & Plan Note (Signed)
Continue cough. Suspect ACE induced and for some reason we didn't discontinue the benazepril as mentioned during her last visit.  Stop amlodipine-benazepril.  Start amlodipine 10 mg. Start losartan 50 mg.  We will plan to see her back in the office in 2 weeks for BP check.

## 2018-12-17 NOTE — Assessment & Plan Note (Signed)
Recent mammogram unremarkable. S/P right mastectomy. Breast exam today unremarkable.

## 2018-12-17 NOTE — Progress Notes (Signed)
Patient ID: Ashlee Lopez, female   DOB: Jul 09, 1933, 85 y.o.   MRN: [id number]  Ashlee Lopez is a 85 year old female who presents today for MWV and follow up of chronic medical conditions.  HPI:  Past Medical History:  Diagnosis Date  . Benign essential HTN 03/21/2011  . Breast cancer, stage 1 ([place]) 02/10/2003   Right tubular breast cancer  . Cancer ([place])   . Diverticulosis of colon 03/23/2011  . Family history of breast cancer   . Family history of colon cancer   . Family history of melanoma   . Family history of prostate cancer   . Fibrocystic disease of breast 03/23/2011  . Graves' disease with exophthalmos 03/23/2011  . Hypertension   . Hypothyroid 03/21/2011  . IBS (irritable bowel syndrome) 03/23/2011  . ITP (idiopathic thrombocytopenic purpura) 03/21/2011  . S/P splenectomy 03/21/2011  . Uterus cancer ([place]) 03/23/1999   Well differentiated AdenoCA of endometrium-superficially confined  . Varicose vein of leg 03/23/2011    Current Outpatient Medications  Medication Sig Dispense Refill  . amLODipine-benazepril (LOTREL) 10-20 MG capsule TAKE 1 CAPSULE BY MOUTH ONCE DAILY FOR BLOOD PRESSURE 90 capsule 1  . aspirin 81 MG tablet Take 81 mg by mouth daily.      Marland Kitchen CALCIUM-VITAMIN D PO Take by mouth.      . citalopram (CELEXA) 20 MG tablet Take 1/2 tablet by mouth once daily for anxiety. 45 tablet 3  . conjugated estrogens (PREMARIN) vaginal cream Apply 0.5mg  (pea-sized amount)  just inside the vaginal introitus with a finger-tip on  Monday, Wednesday and Friday nights. 30 g 12  . CRANBERRY PO Take 3 tablets by mouth daily.    . Diphenhydramine-APAP, sleep, (TYLENOL PM EXTRA STRENGTH PO) Take by mouth.      . hydrochlorothiazide (HYDRODIURIL) 25 MG tablet TAKE 1 TABLET BY MOUTH ONCE DAILY FOR BLOOD PRESSURE 90 tablet 1  . levothyroxine (SYNTHROID) 100 MCG tablet TAKE 1 TAB BY MOUTH ONCE DAILY. TAKE ON AN EMPTY STOMACH WITH A GLASSOF WATER ATLEAST 30-60 MINUTES BEFORE BREAKFAST 90 tablet 1  . metoprolol  tartrate (LOPRESSOR) 50 MG tablet TAKE 1 TABLET BY MOUTH TWICE A DAY 180 tablet 1  . mirabegron ER (MYRBETRIQ) 25 MG TB24 tablet Take 1 tablet (25 mg total) by mouth daily. 30 tablet 11  . Multiple Vitamin (MULTI-VITAMIN PO) Take by mouth.      Marland Kitchen omeprazole (PRILOSEC) 20 MG capsule TAKE 1 CAPSULE BY MOUTH ONCE DAILY FOR HEARTBURN. 90 capsule 0  . rosuvastatin (CRESTOR) 5 MG tablet TAKE 1 TABLET BY MOUTH EVERY OTHER DAY 90 tablet 1   No current facility-administered medications for this visit.     Allergies  Allergen Reactions  . Sulfamethoxazole-Trimethoprim Hives  . Septra [Bactrim]     Family History  Problem Relation Age of Onset  . Breast cancer Mother        Age 69  . Hypertension Father   . Heart disease Father   . Lung cancer Father   . Breast cancer Sister        Age 44  . Melanoma Sister        dx in her 64s  . Dementia Sister   . Heart disease Maternal Aunt   . Prostate cancer Maternal Uncle   . Colon cancer Maternal Grandfather   . Colon cancer Maternal Uncle   . Prostate cancer Maternal Uncle   . Dementia Maternal Aunt   . Breast cancer Cousin  maternal 2nd cousin  . Breast cancer Cousin        maternal first cousin dx in her 21s  . Melanoma Son        dx in his 73s    Social History   Socioeconomic History  . Marital status: Widowed    Spouse name: Not on file  . Number of children: Not on file  . Years of education: Not on file  . Highest education level: Not on file  Occupational History  . Not on file  Social Needs  . Financial resource strain: Not on file  . Food insecurity    Worry: Not on file    Inability: Not on file  . Transportation needs    Medical: Not on file    Non-medical: Not on file  Tobacco Use  . Smoking status: Never Smoker  . Smokeless tobacco: Never Used  Substance and Sexual Activity  . Alcohol use: No  . Drug use: No  . Sexual activity: Never    Birth control/protection: Surgical  Lifestyle  . Physical  activity    Days per week: Not on file    Minutes per session: Not on file  . Stress: Not on file  Relationships  . Social Herbalist on phone: Not on file    Gets together: Not on file    Attends religious service: Not on file    Active member of club or organization: Not on file    Attends meetings of clubs or organizations: Not on file    Relationship status: Not on file  . Intimate partner violence    Fear of current or ex partner: Not on file    Emotionally abused: Not on file    Physically abused: Not on file    Forced sexual activity: Not on file  Other Topics Concern  . Not on file  Social History Narrative   Widow. Lives alone.    3 children, 5 grandchildren.   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   Enjoys reading, watching TV.    Hospitiliaztions: None  Health Maintenance:    Flu: Due today  Pneumovax: Completed in 2013  Prevnar: Completed in 2017  Zostavax: Never completed, Rx printed for Shingrix.  Bone Density: Completed in 2019, taking calcium and vitamin D.  Colonoscopy: Completed in 2014, no longer needed given age.  Eye Doctor: Completed in 2019, due in October 2020  Dental Exam: No recent exam  Mammogram: Completed in March 2020   She is checking her blood pressure at home which is running 110's-130's/70's.   BP Readings from Last 3 Encounters:  12/17/18 (!) 158/80  12/04/18 (!) 175/84  11/19/18 (!) 169/83       Providers: Alma Friendly, PCP; Dr. Junious Silk, Urology; Dr. Lorenz Coaster, Orthopedics.    I have personally reviewed and have noted: 1. The patient's medical and social history 2. Their use of alcohol, tobacco or illicit drugs 3. Their current medications and supplements 4. The patient's functional ability including ADL's, fall risks, home  safety risks and hearing or visual impairment. 5. Diet and physical activities 6. Evidence for depression or mood disorder  Subjective:   Review of Systems:   Constitutional: Denies  fever, malaise, fatigue, headache or abrupt weight changes. Chronic dry/tickle cough.  HEENT: Denies eye pain, eye redness, ear pain, ringing in the ears, wax buildup, runny nose, nasal congestion, bloody nose, or sore throat. Respiratory: Denies difficulty breathing, shortness of breath, cough or sputum production.  Cardiovascular: Denies chest pain, chest tightness, palpitations or swelling in the hands or feet.  Gastrointestinal: Denies abdominal pain, bloating, constipation, diarrhea or blood in the stool.  GU: Denies urgency, frequency, pain with urination, burning sensation, blood in urine, odor or discharge. Musculoskeletal: Denies decrease in range of motion, difficulty with gait. Chronic arthralgias, spinal stenosis.   Skin: Denies redness, rashes, lesions or ulcercations.  Neurological: Denies dizziness, difficulty with memory, difficulty with speech or problems with balance and coordination.  Psychiatric: Denies concerns for depression. Chronic anxiety, feels better off of medication.   No other specific complaints in a complete review of systems (except as listed in HPI above).  Objective:  PE:   BP (!) 158/80   Pulse 76   Temp 97.7 F (36.5 C) (Temporal)   Ht 5\' 4"  (1.626 m)   Wt 144 lb 8 oz (65.5 kg)   SpO2 97%   BMI 24.80 kg/m  Wt Readings from Last 3 Encounters:  12/17/18 144 lb 8 oz (65.5 kg)  12/04/18 146 lb 6.4 oz (66.4 kg)  11/19/18 145 lb 14.4 oz (66.2 kg)    General: Appears their stated age, well developed, well nourished in NAD. Skin: Warm, dry and intact. No rashes, lesions or ulcerations noted. HEENT: Head: normal shape and size; Eyes: sclera white, no icterus, conjunctiva pink, PERRLA and EOMs intact; Ears: Tm's gray and intact, normal light reflex; Nose: mucosa pink and moist, septum midline; Throat/Mouth: Teeth present, mucosa pink and moist, no exudate, lesions or ulcerations noted.  Neck: Normal range of motion. Neck supple, trachea midline. No  massses, lumps or thyromegaly present.  Cardiovascular: Normal rate and rhythm. S1,S2 noted.  No murmur, rubs or gallops noted. No JVD or BLE edema. No carotid bruits noted. Pulmonary/Chest: Normal effort and positive vesicular breath sounds. No respiratory distress. No wheezes, rales or ronchi noted.  Abdomen: Soft and nontender. Normal bowel sounds, no bruits noted. No distention or masses noted. Liver, spleen and kidneys non palpable. Musculoskeletal: Normal range of motion. No signs of joint swelling. No difficulty with gait.  Neurological: Alert and oriented. Cranial nerves II-XII intact. Coordination normal. +DTRs bilaterally. Psychiatric: Mood and affect normal. Behavior is normal. Judgment and thought content normal.    BMET    Component Value Date/Time   NA 134 12/10/2018 0906   NA 133 (L) 03/20/2013 1030   K 3.6 12/10/2018 0906   K 3.6 03/20/2013 1030   CL 96 12/10/2018 0906   CL 97 (L) 03/21/2012 1301   CO2 25 12/10/2018 0906   CO2 27 03/20/2013 1030   GLUCOSE 108 (H) 12/10/2018 0906   GLUCOSE 102 03/20/2013 1030   GLUCOSE 118 (H) 03/21/2012 1301   BUN 15 12/10/2018 0906   BUN 12.0 03/20/2013 1030   CREATININE 0.74 12/10/2018 0906   CREATININE 0.7 03/20/2013 1030   CALCIUM 9.4 12/10/2018 0906   CALCIUM 9.8 03/20/2013 1030   GFRNONAA 75 12/10/2018 0906   GFRAA 87 12/10/2018 0906    Lipid Panel     Component Value Date/Time   CHOL 157 12/10/2018 0906   TRIG 80 12/10/2018 0906   HDL 64 12/10/2018 0906   CHOLHDL 2.5 12/10/2018 0906   LDLCALC 78 12/10/2018 0906    CBC    Component Value Date/Time   WBC 5.3 12/10/2018 0906   WBC 10.1 05/11/2015 0950   RBC 3.92 12/10/2018 0906   RBC 4.40 05/11/2015 0950   HGB 12.5 12/10/2018 0906   HGB 13.5 03/20/2013 1030   HCT 37.2  12/10/2018 0906   HCT 40.0 03/20/2013 1030   PLT 285 12/10/2018 0906   MCV 95 12/10/2018 0906   MCV 96.1 03/20/2013 1030   MCH 31.9 12/10/2018 0906   MCH 32.5 03/20/2013 1030   MCH 32.2  01/31/2010 0858   MCHC 33.6 12/10/2018 0906   MCHC 33.7 05/11/2015 0950   RDW 12.9 12/10/2018 0906   RDW 13.7 03/20/2013 1030   LYMPHSABS 1.4 11/21/2016 0900   LYMPHSABS 1.2 03/20/2013 1030   MONOABS 1.1 (H) 03/20/2013 1030   EOSABS 0.2 11/21/2016 0900   EOSABS 0.1 01/31/2010 0858   BASOSABS 0.1 11/21/2016 0900   BASOSABS 0.1 03/20/2013 1030    Hgb A1C Lab Results  Component Value Date   HGBA1C 5.6 12/04/2017      Assessment and Plan:   Medicare Annual Wellness Visit:  Diet: She is eating a mix of take out food, home cooked meals, frozen dinners. Desserts daily. Drinking water mostly, coffee in the morning, decaf unsweet tea Physical activity: Sedentary Depression/mood screen: Negative Hearing: Failed whisper voice test Visual acuity: Grossly normal, performs annual eye exam  ADLs: Capable Fall risk: None Home safety: Good Cognitive evaluation: Intact to orientation, naming, recall and repetition EOL planning: Adv directives  Preventative Medicine: Immunizations UTD, Rx for Shingrix printed. Mammogram UTD, bone density scan UTD. No need for colonoscopy due to age. Encouraged a healthy diet, regular exercise. All recommendations provided at end of visit.   Next appointment: One year

## 2018-12-17 NOTE — Telephone Encounter (Signed)
Please have patient look at the paper that we provided this morning.  Stop taking amlodipine-benazepril as this is causing her cough.  Start taking amlodipine 10 mg. Was already taking but had to separate it from her prior medication.  Start taking losartan 50 mg. New medication to replace benazepril. Continue taking metoprolol tartrate 50 mg BID. Continue taking HCTZ  We need to see her back in 2 weeks for BP check.

## 2018-12-17 NOTE — Telephone Encounter (Signed)
Patient left a voicemail stating that she was in this morning and a little confused about her blood pressure medications. Patient stated that she wants to make sure that she is to take Lopressor, Cozaar, HCTZ and Norvasc?Ashlee Lopez Patient requested a call  back to confirm this.

## 2018-12-17 NOTE — Assessment & Plan Note (Signed)
Taking levothyroxine appropriately. Recent TSH stable. Continue levothyroxine 100 mcg daily. 

## 2018-12-17 NOTE — Assessment & Plan Note (Signed)
Above goal in the office today, also with numerous readings within her chart. Home readings are stable. Continue Lotrel and metoprolol tartrate as prescribed.  Patient will continue to monitor.

## 2018-12-17 NOTE — Addendum Note (Signed)
Addended by: Jacqualin Combes on: 12/17/2018 12:43 PM   Modules accepted: Orders

## 2018-12-18 NOTE — Telephone Encounter (Signed)
Spoken and notified patient of Kate Clark's comments. Patient verbalized understanding.  

## 2018-12-24 ENCOUNTER — Other Ambulatory Visit: Payer: Self-pay

## 2018-12-24 ENCOUNTER — Ambulatory Visit (INDEPENDENT_AMBULATORY_CARE_PROVIDER_SITE_OTHER): Payer: Medicare Other | Admitting: Urology

## 2018-12-24 ENCOUNTER — Encounter: Payer: Self-pay | Admitting: Urology

## 2018-12-24 VITALS — BP 177/78 | HR 77 | Ht 65.0 in | Wt 144.0 lb

## 2018-12-24 DIAGNOSIS — R350 Frequency of micturition: Secondary | ICD-10-CM

## 2018-12-24 LAB — URINALYSIS, COMPLETE
Bilirubin, UA: NEGATIVE
Glucose, UA: NEGATIVE
Ketones, UA: NEGATIVE
Nitrite, UA: NEGATIVE
Protein,UA: NEGATIVE
Specific Gravity, UA: 1.02 (ref 1.005–1.030)
Urobilinogen, Ur: 0.2 mg/dL (ref 0.2–1.0)
pH, UA: 7.5 (ref 5.0–7.5)

## 2018-12-24 LAB — MICROSCOPIC EXAMINATION
Epithelial Cells (non renal): >10 /hpf — AB (ref 0–10)
RBC, Urine: NONE SEEN /hpf (ref 0–2)

## 2018-12-24 LAB — BLADDER SCAN AMB NON-IMAGING: Scan Result: 44

## 2018-12-24 MED ORDER — SOLIFENACIN SUCCINATE 5 MG PO TABS: 5.0000 mg | ORAL_TABLET | Freq: Every day | ORAL | 3 refills | Status: AC

## 2018-12-24 NOTE — Patient Instructions (Signed)

## 2018-12-24 NOTE — Progress Notes (Signed)
12/24/2018 10:51 AM   Ashlee Lopez [birth year] [id number]  Referring provider: Pleas Koch, NP [place],  [place] [address]  Chief Complaint  Patient presents with  . Urinary Frequency    HPI:  Ashlee Lopez is a patient followedfor OAB symptoms,mixed incontinenceand recurrent UTI. She has a history of dysuria and mixed incontinence, but also asymptomatic bacteriuria. Low dose abx suppression was discussed but not started.She followed up with PA McGowanand had a normal pelvic exam exhibiting atrophic vaginitis. PA McGowan put her on probiotics, vitamin C and cranberry supplements as well as transvaginal estrogen. Renal ultrasound was normal March 28, 2018. PVR has been normal.   She continued to have frequency and urgency. She wears a few ppd due to UUI. She has occasional urine odor and cloudiness. No dysuria. She started Myrbetriq 25 mg. She has nocturia. She has LE edema and hasn't been wearing pressure stocking.  She started Myrbetriq.  She returns today and management of the above.  She was seen a couple of times last month with lower urinary tract symptoms and urine culture was positive for Klebsiella pneumoniae.  PVR is normal today. Noc x 3.    PMH: Past Medical History:  Diagnosis Date  . Benign essential HTN 03/21/2011  . Breast cancer, stage 1 ([place]) 02/10/2003   Right tubular breast cancer  . Cancer ([place])   . Diverticulosis of colon 03/23/2011  . Family history of breast cancer   . Family history of colon cancer   . Family history of melanoma   . Family history of prostate cancer   . Fibrocystic disease of breast 03/23/2011  . Graves' disease with exophthalmos 03/23/2011  . Hypertension   . Hypothyroid 03/21/2011  . IBS (irritable bowel syndrome) 03/23/2011  . ITP (idiopathic thrombocytopenic purpura) 03/21/2011  . S/P splenectomy 03/21/2011  . 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  Varicose vein of leg 03/23/2011    Surgical History: Past Surgical History:  Procedure Laterality Date  . ABDOMINAL HYSTERECTOMY  2001  . APPENDECTOMY  1941  . BREAST SURGERY  02/17/2003   Mastectomy-Right  . MASTECTOMY  2004   Dr Margot Chimes  . OOPHORECTOMY     BSO  . SKIN CANCER EXCISION  2019   removal of cancer from ear  . SPLENECTOMY  1986    Home Medications:  Allergies as of 12/24/2018      Reactions   Sulfamethoxazole-trimethoprim Hives   Septra [bactrim]       Medication List       Accurate as of December 24, 2018 10:51 AM. If you have any questions, ask your nurse or doctor.        amLODipine 10 MG tablet Commonly known as: NORVASC Take 1 tablet (10 mg total) by mouth daily. For blood pressure.   aspirin 81 MG tablet Take 81 mg by mouth daily.   CALCIUM-VITAMIN D PO Take by mouth.   conjugated estrogens vaginal cream Commonly known as: Premarin Apply 0.5mg  (pea-sized amount)  just inside the vaginal introitus with a finger-tip on  Monday, Wednesday and Friday nights.   CRANBERRY PO Take 3 tablets by mouth daily.   hydrochlorothiazide 25 MG tablet Commonly known as: HYDRODIURIL TAKE 1 TABLET BY MOUTH ONCE DAILY FOR BLOOD PRESSURE   levothyroxine 100 MCG tablet Commonly known as: SYNTHROID TAKE 1 TAB BY MOUTH ONCE DAILY. TAKE ON AN EMPTY STOMACH WITH A GLASSOF WATER ATLEAST 30-60 MINUTES BEFORE  BREAKFAST   losartan 50 MG tablet Commonly known as: COZAAR Take 1 tablet (50 mg total) by mouth daily. For blood pressure.   metoprolol tartrate 50 MG tablet Commonly known as: LOPRESSOR TAKE 1 TABLET BY MOUTH TWICE A DAY   mirabegron ER 25 MG Tb24 tablet Commonly known as: MYRBETRIQ Take 1 tablet (25 mg total) by mouth daily.   MULTI-VITAMIN PO Take by mouth.   omeprazole 20 MG capsule Commonly known as: PRILOSEC TAKE 1 CAPSULE BY MOUTH ONCE DAILY FOR HEARTBURN.   rosuvastatin 5 MG tablet Commonly known as: CRESTOR TAKE 1 TABLET BY MOUTH EVERY  OTHER DAY   TYLENOL PM EXTRA STRENGTH PO Take by mouth.       Allergies:  Allergies  Allergen Reactions  . Sulfamethoxazole-Trimethoprim Hives  . Septra [Bactrim]     Family History: Family History  Problem Relation Age of Onset  . Breast cancer Mother        Age 55  . Hypertension Father   . Heart disease Father   . Lung cancer Father   . Breast cancer Sister        Age 73  . Melanoma Sister        dx in her 34s  . Dementia Sister   . Heart disease Maternal Aunt   . Prostate cancer Maternal Uncle   . Colon cancer Maternal Grandfather   . Colon cancer Maternal Uncle   . Prostate cancer Maternal Uncle   . Dementia Maternal Aunt   . Breast cancer Cousin        maternal 2nd cousin  . Breast cancer Cousin        maternal first cousin dx in her 49s  . Melanoma Son        dx in his 10s    Social History:  reports that she has never smoked. She has never used smokeless tobacco. She reports that she does not drink alcohol or use drugs.  ROS: UROLOGY Frequent Urination?: Yes Hard to postpone urination?: Yes Burning/pain with urination?: No Get up at night to urinate?: Yes Leakage of urine?: Yes Urine stream starts and stops?: No Trouble starting stream?: No Do you have to strain to urinate?: No Blood in urine?: No Urinary tract infection?: Yes Sexually transmitted disease?: No Injury to kidneys or bladder?: No Painful intercourse?: No Weak stream?: No Currently pregnant?: No Vaginal bleeding?: No Last menstrual period?: n  Gastrointestinal Nausea?: No Vomiting?: No Indigestion/heartburn?: No Diarrhea?: No Constipation?: No  Constitutional Fever: No Night sweats?: No Weight loss?: No Fatigue?: No  Skin Skin rash/lesions?: Yes Itching?: No  Eyes Blurred vision?: No Double vision?: No  Ears/Nose/Throat Sore throat?: No Sinus problems?: No  Hematologic/Lymphatic Swollen glands?: No Easy bruising?: Yes  Cardiovascular Leg swelling?: No  Chest pain?: No  Respiratory Cough?: Yes Shortness of breath?: No  Endocrine Excessive thirst?: No  Musculoskeletal Back pain?: Yes Joint pain?: Yes  Neurological Headaches?: No Dizziness?: No  Psychologic Depression?: No Anxiety?: Yes  Physical Exam: BP (!) 177/78   Pulse 77   Ht 5\' 5"  (1.651 m)   Wt 65.3 kg   BMI 23.96 kg/m   Constitutional:  Alert and oriented, No acute distress. HEENT: [place]onday AT, moist mucus membranes.  Trachea midline, no masses. Cardiovascular: No clubbing, cyanosis, or edema. Respiratory: Normal respiratory effort, no increased work of breathing. GI: Abdomen is soft, nontender, nondistended, no abdominal masses GU: No CVA tenderness Lymph: No cervical or inguinal lymphadenopathy. Skin: No rashes, bruises or suspicious lesions.  Neurologic: Grossly intact, no focal deficits, moving all 4 extremities. Psychiatric: Normal mood and affect.  Laboratory Data: Lab Results  Component Value Date   WBC 5.3 12/10/2018   HGB 12.5 12/10/2018   HCT 37.2 12/10/2018   MCV 95 12/10/2018   PLT 285 12/10/2018    Lab Results  Component Value Date   CREATININE 0.74 12/10/2018    No results found for: PSA  No results found for: TESTOSTERONE  Lab Results  Component Value Date   HGBA1C 5.7 (A) 12/17/2018    Urinalysis    Component Value Date/Time   APPEARANCEUR Cloudy (A) 12/04/2018 1133   GLUCOSEU Negative 12/04/2018 1133   BILIRUBINUR Negative 12/04/2018 1133   PROTEINUR Negative 12/04/2018 1133   UROBILINOGEN 0.2 12/24/2017 0830   NITRITE Negative 12/04/2018 1133   LEUKOCYTESUR 1+ (A) 12/04/2018 1133    Lab Results  Component Value Date   LABMICR See below: 12/04/2018   WBCUA >30 (A) 12/04/2018   RBCUA 0-2 05/09/2018   LABEPIT 0-10 12/04/2018   MUCUS Present (A) 05/09/2018   BACTERIA Many (A) 12/04/2018    Pertinent Imaging:  n/a  No results found for this or any previous visit. No results found for this or any previous visit. No  results found for this or any previous visit. No results found for this or any previous visit. Results for orders placed during the hospital encounter of 03/28/18  US RENAL   Narrative CLINICAL DATA:  Recurrent UTI  EXAM: RENAL / URINARY TRACT ULTRASOUND COMPLETE  COMPARISON:  None  FINDINGS: Right Kidney:  Renal measurements: 9.7 x 3.5 x 5.0 cm = volume: 87.4 mL. Normal cortical thickness. Increased cortical echogenicity. No mass, hydronephrosis or shadowing calcification.  Left Kidney:  Renal measurements: 10.4 x 5.6 x 4.6 cm = volume: 132.4 mL. Normal cortical thickness. Increased cortical echogenicity. Simple appearing cyst at inferior pole 2.9 x 3.2 x 3.1 cm. Tiny cyst at medial aspect of mid upper pole 1.5 x 0.9 x 1.0 cm. No hydronephrosis or shadowing calcification.  Bladder:  Appears normal for degree of bladder distention. BILATERAL ureteral jets seen.  IMPRESSION: Medical renal disease changes of both kidneys.  Two LEFT renal cysts.  Otherwise negative exam.   Electronically Signed   By: Lavonia Dana M.D.   On: 03/28/2018 15:06    No results found for this or any previous visit. No results found for this or any previous visit. No results found for this or any previous visit.  Assessment & Plan:    1. Urinary frequency - change to Vesicare.  Myrbetriq is not making any difference in her symptoms and expensive.  We again went over the nature risk and benefits of anticholinergics.  She is here with her daughter-in-law.  For nocturia specifically we went over the nature risk and benefits of desmopressin and they will hold off on that.  - Urinalysis, Complete - Bladder Scan (Post Void Residual) in office  2. Bacteriuria - she is doing more water, vit c, cranberry and TV estrogen.  She has no bladder pain today or dysuria.  I would recommend against antibiotics.  He has good questions about preventing future symptomatic episodes and we discussed antibiotic  use is not been shown to prevent future symptomatic episodes.  She is doing well right now.  And we will continue to monitor.  No follow-ups on file.  Festus Aloe, MD  [facility] [address], [place], [place] [address] [phone]

## 2018-12-29 ENCOUNTER — Other Ambulatory Visit: Payer: Self-pay | Admitting: Primary Care

## 2018-12-29 DIAGNOSIS — I10 Essential (primary) hypertension: Secondary | ICD-10-CM

## 2018-12-31 ENCOUNTER — Encounter: Payer: Self-pay | Admitting: Primary Care

## 2018-12-31 ENCOUNTER — Ambulatory Visit (INDEPENDENT_AMBULATORY_CARE_PROVIDER_SITE_OTHER): Payer: Medicare Other | Admitting: Primary Care

## 2018-12-31 ENCOUNTER — Other Ambulatory Visit: Payer: Self-pay

## 2018-12-31 DIAGNOSIS — R053 Chronic cough: Secondary | ICD-10-CM

## 2018-12-31 DIAGNOSIS — R05 Cough: Secondary | ICD-10-CM

## 2018-12-31 DIAGNOSIS — I10 Essential (primary) hypertension: Secondary | ICD-10-CM | POA: Diagnosis not present

## 2018-12-31 NOTE — Assessment & Plan Note (Addendum)
No improvement since discontinuation of ACE-inhibitor (benazepril). Stable. Will remain off ACE-inhibitor at this time.   Suspect somecomponent of environmental irritant.   Continue to monitor.

## 2018-12-31 NOTE — Patient Instructions (Addendum)
Continue taking your blood pressure medication as prescribed.   Please check your blood pressure daily at home and record your readings. Please call me in one week to discuss your home blood pressure readings.  Ensure you are consuming 64 ounces of water daily.  Start exercising. You should be getting 150 minutes of moderate intensity exercise weekly.  It was a pleasure to see you today!

## 2018-12-31 NOTE — Progress Notes (Signed)
Subjective:    Patient ID: Ashlee Lopez, female    DOB: 07/15/1933, 85 y.o.   MRN: [id number]  HPI  Ashlee Lopez is a 85 year old female with a history of hypertension, hypothyroidism, ITP, anxiety disorder who presents today for follow up of hypertension.  She was last evaluated two weeks ago for her annual physical with reports of chronic cough and also elevated blood pressure readings.  Given her cough amlodipine-benazepril was discontinued and she was put on amlodipine 10 mg and losartan 50 mg. Her HCTZ 25 mg and metoprolol tartrate 50 mg BID was continued.  Since her last visit she continues to experience the cough, she thinks this is secondary to allergies. She's checked her BP twice at home with readings of 138/83, 135/84. She was at the Urologist last week with elevated readings. She endorses compliance to her medications. Denies dizziness, visual changes, headaches.   BP Readings from Last 3 Encounters:  12/31/18 (!) 158/72  12/24/18 (!) 177/78  12/17/18 (!) 158/80     Review of Systems  Constitutional: Negative for fever.  Eyes: Negative for visual disturbance.  Respiratory: Positive for cough. Negative for shortness of breath.   Cardiovascular: Negative for chest pain.  Neurological: Negative for dizziness and headaches.       Past Medical History:  Diagnosis Date  . Benign essential HTN 03/21/2011  . Breast cancer, stage 1 ([place]) 02/10/2003   Right tubular breast cancer  . Cancer ([place])   . Diverticulosis of colon 03/23/2011  . Family history of breast cancer   . Family history of colon cancer   . Family history of melanoma   . Family history of prostate cancer   . Fibrocystic disease of breast 03/23/2011  . Graves' disease with exophthalmos 03/23/2011  . Hypertension   . Hypothyroid 03/21/2011  . IBS (irritable bowel syndrome) 03/23/2011  . ITP (idiopathic thrombocytopenic purpura) 03/21/2011  . S/P splenectomy 03/21/2011  . Uterus cancer ([place]) 03/23/1999   Well  differentiated AdenoCA of endometrium-superficially confined  . Varicose vein of leg 03/23/2011     Social History   Socioeconomic History  . Marital status: Widowed    Spouse name: Not on file  . Number of children: Not on file  . Years of education: Not on file  . Highest education level: Not on file  Occupational History  . Not on file  Social Needs  . Financial resource strain: Not on file  . Food insecurity    Worry: Not on file    Inability: Not on file  . Transportation needs    Medical: Not on file    Non-medical: Not on file  Tobacco Use  . Smoking status: Never Smoker  . Smokeless tobacco: Never Used  Substance and Sexual Activity  . Alcohol use: No  . Drug use: No  . Sexual activity: Never    Birth control/protection: Surgical  Lifestyle  . Physical activity    Days per week: Not on file    Minutes per session: Not on file  . Stress: Not on file  Relationships  . Social Herbalist on phone: Not on file    Gets together: Not on file    Attends religious service: Not on file    Active member of club or organization: Not on file    Attends meetings of clubs or organizations: Not on file    Relationship status: Not on file  . Intimate partner violence    Fear  of current or ex partner: Not on file    Emotionally abused: Not on file    Physically abused: Not on file    Forced sexual activity: Not on file  Other Topics Concern  . Not on file  Social History Narrative   Widow. Lives alone.    3 children, 5 grandchildren.   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   Enjoys reading, watching TV.    Past Surgical History:  Procedure Laterality Date  . ABDOMINAL HYSTERECTOMY  2001  . APPENDECTOMY  1941  . BREAST SURGERY  02/17/2003   Mastectomy-Right  . MASTECTOMY  2004   Dr Margot Chimes  . OOPHORECTOMY     BSO  . SKIN CANCER EXCISION  2019   removal of cancer from ear  . SPLENECTOMY  1986    Family History  Problem Relation Age of Onset  . Breast  cancer Mother        Age 24  . Hypertension Father   . Heart disease Father   . Lung cancer Father   . Breast cancer Sister        Age 15  . Melanoma Sister        dx in her 48s  . Dementia Sister   . Heart disease Maternal Aunt   . Prostate cancer Maternal Uncle   . Colon cancer Maternal Grandfather   . Colon cancer Maternal Uncle   . Prostate cancer Maternal Uncle   . Dementia Maternal Aunt   . Breast cancer Cousin        maternal 2nd cousin  . Breast cancer Cousin        maternal first cousin dx in her 66s  . Melanoma Son        dx in his 49s    Allergies  Allergen Reactions  . Sulfamethoxazole-Trimethoprim Hives  . Septra [Bactrim]     Current Outpatient Medications on File Prior to Visit  Medication Sig Dispense Refill  . amLODipine (NORVASC) 10 MG tablet Take 1 tablet (10 mg total) by mouth daily. For blood pressure. 90 tablet 3  . aspirin 81 MG tablet Take 81 mg by mouth daily.      Marland Kitchen CALCIUM-VITAMIN D PO Take by mouth.      . conjugated estrogens (PREMARIN) vaginal cream Apply 0.5mg  (pea-sized amount)  just inside the vaginal introitus with a finger-tip on  Monday, Wednesday and Friday nights. 30 g 12  . CRANBERRY PO Take 3 tablets by mouth daily.    . Diphenhydramine-APAP, sleep, (TYLENOL PM EXTRA STRENGTH PO) Take by mouth.      . hydrochlorothiazide (HYDRODIURIL) 25 MG tablet TAKE 1 TABLET BY MOUTH ONCE DAILY FOR BLOOD PRESSURE 90 tablet 1  . levothyroxine (SYNTHROID) 100 MCG tablet TAKE 1 TAB BY MOUTH ONCE DAILY. TAKE ON AN EMPTY STOMACH WITH A GLASSOF WATER ATLEAST 30-60 MINUTES BEFORE BREAKFAST 90 tablet 1  . losartan (COZAAR) 50 MG tablet Take 1 tablet (50 mg total) by mouth daily. For blood pressure. 30 tablet 0  . metoprolol tartrate (LOPRESSOR) 50 MG tablet TAKE 1 TABLET BY MOUTH TWICE A DAY 180 tablet 1  . Multiple Vitamin (MULTI-VITAMIN PO) Take by mouth.      Marland Kitchen omeprazole (PRILOSEC) 20 MG capsule TAKE 1 CAPSULE BY MOUTH ONCE DAILY FOR HEARTBURN. 90  capsule 0  . rosuvastatin (CRESTOR) 5 MG tablet TAKE 1 TABLET BY MOUTH EVERY OTHER DAY 90 tablet 1  . solifenacin (VESICARE) 5 MG tablet Take 1 tablet (5 mg total)  by mouth daily. 30 tablet 3   No current facility-administered medications on file prior to visit.     BP (!) 158/72   Pulse 74   Temp (!) 96.7 F (35.9 C) (Temporal)   Ht 5\' 4"  (1.626 m)   Wt 147 lb 8 oz (66.9 kg)   SpO2 97%   BMI 25.32 kg/m    Objective:   Physical Exam  Constitutional: She appears well-nourished.  Neck: Neck supple.  Cardiovascular: Normal rate and regular rhythm.  Respiratory: Effort normal and breath sounds normal.  Skin: Skin is warm and dry.  Psychiatric: She has a normal mood and affect.           Assessment & Plan:

## 2018-12-31 NOTE — Progress Notes (Signed)
   Subjective:    Patient ID: Ashlee Lopez, female    DOB: Sep 08, 1933, 85 y.o.   MRN: [id number]  HPI  Ashlee Lopez is an 85 year old female with a history of hypertension, HLD, ITP, chronic cough, IBS and generalized anxiety disorder who presents today for a follow up of her hypertension.   She was last seen 12/17/18 for a complete physical exam, during which her blood pressure was elevated. She also complained of a chronic cough. Her amlodipine-benazipril was discontinued at that time. She was started on amlodipine 10 mg daily and losartan 50 mg daily, in addition to continuing her metoprolol 50 mg and hydrochlorothiazide 25 mg daily.    She continues to complain of intermittent cough, she states she now thinks this may be related to environmental irritants. She has checked her blood pressure twice at home since her last visit with readings of 135/84 and 138/83.   She endorses taking her blood pressure medications as prescribed. She denies headache, dizziness, vision changes.    BP Readings from Last 3 Encounters:  12/31/18 (!) 158/72  12/24/18 (!) 177/78  12/17/18 (!) 158/80     Review of Systems  Eyes: Negative for visual disturbance.  Respiratory: Negative for shortness of breath.   Cardiovascular: Positive for leg swelling (chronic bilaeral lower extremity edema). Negative for chest pain.  Neurological: Negative for dizziness and headaches.       Objective:   Physical Exam Cardiovascular:     Rate and Rhythm: Normal rate and regular rhythm.     Heart sounds: Normal heart sounds.  Pulmonary:     Effort: Pulmonary effort is normal.     Breath sounds: Normal breath sounds.  Musculoskeletal:     Right lower leg: Edema (mild, non-pitting) present.     Left lower leg: Edema (mild, non-pitting) present.  Neurological:     Mental Status: She is alert.           Assessment & Plan:

## 2018-12-31 NOTE — Assessment & Plan Note (Addendum)
BP elevated in office today, 158/72, remains above goal.   Given recent change in medication and limited home blood pressure readings, recommend daily home BP monitoring for one addtional week to obtain more representative readings.   Continue current medication regimen.   Patient to call in one week to discuss home blood pressure readings. Will assess need for medication adjustments, if any, at that time.

## 2019-01-06 DIAGNOSIS — S92505A Nondisplaced unspecified fracture of left lesser toe(s), initial encounter for closed fracture: Secondary | ICD-10-CM | POA: Diagnosis not present

## 2019-01-07 ENCOUNTER — Other Ambulatory Visit: Payer: Self-pay

## 2019-01-07 ENCOUNTER — Ambulatory Visit (INDEPENDENT_AMBULATORY_CARE_PROVIDER_SITE_OTHER): Payer: Medicare Other | Admitting: Urology

## 2019-01-07 ENCOUNTER — Encounter: Payer: Self-pay | Admitting: Urology

## 2019-01-07 VITALS — BP 130/80 | HR 74 | Ht 64.0 in | Wt 150.0 lb

## 2019-01-07 DIAGNOSIS — R350 Frequency of micturition: Secondary | ICD-10-CM

## 2019-01-07 LAB — BLADDER SCAN AMB NON-IMAGING

## 2019-01-07 NOTE — Patient Instructions (Signed)

## 2019-01-07 NOTE — Progress Notes (Signed)
01/07/2019 9:53 AM   Ashlee Lopez [birth year] [id number]  Referring provider: Pleas Koch, NP [place],  [place] [address]  No chief complaint on file.   HPI:  Ashlee Lopez is a patient followedfor OAB symptoms,mixed incontinenceand recurrent UTI. She has a history of dysuria and mixed incontinence, but also asymptomatic bacteriuria. Low dose abx suppression was discussed but not started.She followed up with PA McGowanand had a normal pelvic exam exhibiting atrophic vaginitis. PA McGowan put her on probiotics, vitamin C and cranberry supplements as well as transvaginal estrogen. Renal ultrasound was normal March 28, 2018. PVR has been normal.   She continued to havefrequency and urgency. She wears a few ppd due to UUI. She has occasional urine odor and cloudiness. No dysuria.She started Myrbetriq 25 mg. She has nocturia. She has LE edema and hasn't been wearing pressure stocking.  She started Myrbetriq.  She had + klebsiella urine cx Sep 2020. PVR normal. Today, Ashlee Lopez returns with urgency and UUI. She drank more water. [place] rode to [place]. She took Azo. She has some dysuria which cleared up. She hasn't voided here and bladder scan only 243 ml. No gross hematuria. Since increasing water her dysuria resolved but she's concerned about a UTI.   PMH: Past Medical History:  Diagnosis Date  . Benign essential HTN 03/21/2011  . Breast cancer, stage 1 ([place]) 02/10/2003   Right tubular breast cancer  . Cancer ([place])   . Diverticulosis of colon 03/23/2011  . Family history of breast cancer   . Family history of colon cancer   . Family history of melanoma   . Family history of prostate cancer   . Fibrocystic disease of breast 03/23/2011  . Graves' disease with exophthalmos 03/23/2011  . Hypertension   . Hypothyroid 03/21/2011  . IBS (irritable bowel syndrome) 03/23/2011  . ITP (idiopathic thrombocytopenic purpura) 03/21/2011  . S/P splenectomy 03/21/2011  . Uterus cancer  ([place]) 03/23/1999   Well differentiated AdenoCA of endometrium-superficially confined  . Varicose vein of leg 03/23/2011    Surgical History: Past Surgical History:  Procedure Laterality Date  . ABDOMINAL HYSTERECTOMY  2001  . APPENDECTOMY  1941  . BREAST SURGERY  02/17/2003   Mastectomy-Right  . MASTECTOMY  2004   Dr Margot Chimes  . OOPHORECTOMY     BSO  . SKIN CANCER EXCISION  2019   removal of cancer from ear  . SPLENECTOMY  1986    Home Medications:  Allergies as of 01/07/2019      Reactions   Sulfamethoxazole-trimethoprim Hives   Septra [bactrim]       Medication List       Accurate as of January 07, 2019  9:53 AM. If you have any questions, ask your nurse or doctor.        amLODipine 10 MG tablet Commonly known as: NORVASC Take 1 tablet (10 mg total) by mouth daily. For blood pressure.   aspirin 81 MG tablet Take 81 mg by mouth daily.   CALCIUM-VITAMIN D PO Take by mouth.   conjugated estrogens vaginal cream Commonly known as: Premarin Apply 0.5mg  (pea-sized amount)  just inside the vaginal introitus with a finger-tip on  Monday, Wednesday and Friday nights.   CRANBERRY PO Take 3 tablets by mouth daily.   hydrochlorothiazide 25 MG tablet Commonly known as: HYDRODIURIL TAKE 1 TABLET BY MOUTH ONCE DAILY FOR BLOOD PRESSURE   levothyroxine 100 MCG tablet Commonly known as: SYNTHROID TAKE 1 TAB BY MOUTH ONCE  DAILY. TAKE ON AN EMPTY STOMACH WITH A GLASSOF WATER ATLEAST 30-60 MINUTES BEFORE BREAKFAST   losartan 50 MG tablet Commonly known as: COZAAR Take 1 tablet (50 mg total) by mouth daily. For blood pressure.   metoprolol tartrate 50 MG tablet Commonly known as: LOPRESSOR TAKE 1 TABLET BY MOUTH TWICE A DAY   MULTI-VITAMIN PO Take by mouth.   omeprazole 20 MG capsule Commonly known as: PRILOSEC TAKE 1 CAPSULE BY MOUTH ONCE DAILY FOR HEARTBURN.   rosuvastatin 5 MG tablet Commonly known as: CRESTOR TAKE 1 TABLET BY MOUTH EVERY OTHER DAY   solifenacin  5 MG tablet Commonly known as: VESICARE Take 1 tablet (5 mg total) by mouth daily.   TYLENOL PM EXTRA STRENGTH PO Take by mouth.       Allergies:  Allergies  Allergen Reactions  . Sulfamethoxazole-Trimethoprim Hives  . Septra [Bactrim]     Family History: Family History  Problem Relation Age of Onset  . Breast cancer Mother        Age 63  . Hypertension Father   . Heart disease Father   . Lung cancer Father   . Breast cancer Sister        Age 33  . Melanoma Sister        dx in her 38s  . Dementia Sister   . Heart disease Maternal Aunt   . Prostate cancer Maternal Uncle   . Colon cancer Maternal Grandfather   . Colon cancer Maternal Uncle   . Prostate cancer Maternal Uncle   . Dementia Maternal Aunt   . Breast cancer Cousin        maternal 2nd cousin  . Breast cancer Cousin        maternal first cousin dx in her 61s  . Melanoma Son        dx in his 51s    Social History:  reports that she has never smoked. She has never used smokeless tobacco. She reports that she does not drink alcohol or use drugs.  ROS:                                        Physical Exam: There were no vitals taken for this visit.  Constitutional:  Alert and oriented, No acute distress. HEENT: [place] AT, moist mucus membranes.  Trachea midline, no masses. Cardiovascular: No clubbing, cyanosis, or edema. Respiratory: Normal respiratory effort, no increased work of breathing. GI: Abdomen is soft, nontender, nondistended, no abdominal masses GU: No CVA tenderness Lymph: No cervical or inguinal lymphadenopathy. Skin: No rashes, bruises or suspicious lesions. Neurologic: Grossly intact, no focal deficits, moving all 4 extremities. Psychiatric: Normal mood and affect.  Laboratory Data: Lab Results  Component Value Date   WBC 5.3 12/10/2018   HGB 12.5 12/10/2018   HCT 37.2 12/10/2018   MCV 95 12/10/2018   PLT 285 12/10/2018    Lab Results  Component Value Date    CREATININE 0.74 12/10/2018    No results found for: PSA  No results found for: TESTOSTERONE  Lab Results  Component Value Date   HGBA1C 5.7 (A) 12/17/2018    Urinalysis    Component Value Date/Time   APPEARANCEUR Cloudy (A) 12/24/2018 1042   GLUCOSEU Negative 12/24/2018 1042   BILIRUBINUR Negative 12/24/2018 1042   PROTEINUR Negative 12/24/2018 1042   UROBILINOGEN 0.2 12/24/2017 0830   NITRITE Negative 12/24/2018 1042  LEUKOCYTESUR 2+ (A) 12/24/2018 1042    Lab Results  Component Value Date   LABMICR See below: 12/24/2018   WBCUA 6-10 (A) 12/24/2018   RBCUA 0-2 05/09/2018   LABEPIT >10 (A) 12/24/2018   MUCUS Present (A) 05/09/2018   BACTERIA Few 12/24/2018    No results found for this or any previous visit. No results found for this or any previous visit. No results found for this or any previous visit. No results found for this or any previous visit. Results for orders placed during the hospital encounter of 03/28/18  US RENAL   Narrative CLINICAL DATA:  Recurrent UTI  EXAM: RENAL / URINARY TRACT ULTRASOUND COMPLETE  COMPARISON:  None  FINDINGS: Right Kidney:  Renal measurements: 9.7 x 3.5 x 5.0 cm = volume: 87.4 mL. Normal cortical thickness. Increased cortical echogenicity. No mass, hydronephrosis or shadowing calcification.  Left Kidney:  Renal measurements: 10.4 x 5.6 x 4.6 cm = volume: 132.4 mL. Normal cortical thickness. Increased cortical echogenicity. Simple appearing cyst at inferior pole 2.9 x 3.2 x 3.1 cm. Tiny cyst at medial aspect of mid upper pole 1.5 x 0.9 x 1.0 cm. No hydronephrosis or shadowing calcification.  Bladder:  Appears normal for degree of bladder distention. BILATERAL ureteral jets seen.  IMPRESSION: Medical renal disease changes of both kidneys.  Two LEFT renal cysts.  Otherwise negative exam.   Electronically Signed   By: Lavonia Dana M.D.   On: 03/28/2018 15:06    No results found for this or any previous  visit. No results found for this or any previous visit. No results found for this or any previous visit.  Assessment & Plan:    There are no diagnoses linked to this encounter. Frequency, urgency -I would not recommend antibiotics at the current time.  She cannot leave a urine specimen but if she is able will come back today with 1 and we can perform a urinalysis. No follow-ups on file.  Festus Aloe, MD  [facility] [address], [place], [place] [address] [phone]

## 2019-01-08 ENCOUNTER — Telehealth: Payer: Self-pay | Admitting: Urology

## 2019-01-08 ENCOUNTER — Other Ambulatory Visit: Payer: Medicare Other

## 2019-01-08 DIAGNOSIS — N390 Urinary tract infection, site not specified: Secondary | ICD-10-CM

## 2019-01-08 LAB — URINALYSIS, COMPLETE
Bilirubin, UA: NEGATIVE
Nitrite, UA: POSITIVE — AB
Specific Gravity, UA: 1.015 (ref 1.005–1.030)
Urobilinogen, Ur: 1 mg/dL (ref 0.2–1.0)
pH, UA: 6 (ref 5.0–7.5)

## 2019-01-08 LAB — MICROSCOPIC EXAMINATION: WBC, UA: >30 /hpf — AB (ref 0–5)

## 2019-01-08 NOTE — Telephone Encounter (Signed)
Spoke with patient and she is still having symptoms, UA yesterday does look suspicious for infection not enough for culture. Patient to come in today and leave another urine for culture only, order placed

## 2019-01-08 NOTE — Telephone Encounter (Signed)
Pt was in office yesterday and left a urine.  She wants to know if she has an infection.

## 2019-01-09 ENCOUNTER — Telehealth: Payer: Self-pay | Admitting: Primary Care

## 2019-01-09 ENCOUNTER — Telehealth: Payer: Self-pay | Admitting: Urology

## 2019-01-09 DIAGNOSIS — I10 Essential (primary) hypertension: Secondary | ICD-10-CM

## 2019-01-09 MED ORDER — LOSARTAN POTASSIUM 50 MG PO TABS: 50.0000 mg | ORAL_TABLET | Freq: Every day | ORAL | 3 refills | Status: DC

## 2019-01-09 NOTE — Telephone Encounter (Signed)
Spoken and notified patient of Kate Clark's comments. Patient verbalized understanding.  

## 2019-01-09 NOTE — Telephone Encounter (Signed)
Please thank patient for BP readings, continue current regimen of:  HCTZ 25 mg Amlodipine 10 mg Losartan 50 mg, will send refills to pharmacy now. Metoprolol tartrate 50 mg.

## 2019-01-09 NOTE — Telephone Encounter (Signed)
Called and spoke to patient and notified her that we need to wait on culture results prior to treatment. UA done on 01-07-19 was collected with taking AZO. Patient denies fever,chills,nauesa. Her only symptom is pressure and urgency. Will call next week with culture results

## 2019-01-09 NOTE — Telephone Encounter (Signed)
Patient is calling to give her BP readings for the last week  125/72- 15th 123/77- 16th 129/78- 17th 124/15- 18th 130/76 - 19th 130/76- 20th  Skipped -21st 111/69-22nd

## 2019-01-09 NOTE — Telephone Encounter (Signed)
Pt called and would like a call back to discuss UA results from yesterday.

## 2019-01-13 ENCOUNTER — Telehealth: Payer: Self-pay

## 2019-01-13 NOTE — Telephone Encounter (Signed)
Patient informed of results and recommendations

## 2019-01-13 NOTE — Telephone Encounter (Signed)
-----   Message from Festus Aloe, MD sent at 01/13/2019  1:21 PM EDT ----- Let Ms Millwee know she has some bacteria in her bladder but that is OK as long as she doesn't have any bladder pain or dysuria. She should continue to drink plenty of water and her body with naturally reach an equilibrium. Antibiotics in her situation hasnt been shown to prevent future UTIs. Thanks.  ----- Message ----- From: Amado Coe, CMA Sent: 01/08/2019  11:49 AM EDT To: Festus Aloe, MD

## 2019-01-14 LAB — CULTURE, URINE COMPREHENSIVE

## 2019-02-02 DIAGNOSIS — H2513 Age-related nuclear cataract, bilateral: Secondary | ICD-10-CM | POA: Diagnosis not present

## 2019-02-05 ENCOUNTER — Other Ambulatory Visit: Payer: Self-pay | Admitting: Primary Care

## 2019-02-05 DIAGNOSIS — K219 Gastro-esophageal reflux disease without esophagitis: Secondary | ICD-10-CM

## 2019-03-25 ENCOUNTER — Telehealth: Payer: Self-pay | Admitting: *Deleted

## 2019-03-25 NOTE — Telephone Encounter (Signed)
Tried to call patient but is was [place]

## 2019-03-25 NOTE — Telephone Encounter (Signed)
Patient left a voicemail wanting to know if it is okay for her to take Zinc 50 mg because she understands that is good for you?  Patient stated that she is already taking Vitamin D3, 1000 IU twice a day and calcium 600 mg twice a day. Patient wants to know if Anda Kraft thinks that she should get the covid vaccine?

## 2019-03-25 NOTE — Telephone Encounter (Signed)
Zinc should be okay, I don't see why she cannot take this. Yes, I do recommend the Covid vaccine.

## 2019-03-26 NOTE — Telephone Encounter (Signed)
Spoken and notified patient of Kate Clark's comments. Patient verbalized understanding.  

## 2019-03-27 ENCOUNTER — Ambulatory Visit: Payer: Medicare Other | Admitting: Urology

## 2019-04-09 DIAGNOSIS — C44319 Basal cell carcinoma of skin of other parts of face: Secondary | ICD-10-CM | POA: Diagnosis not present

## 2019-04-09 DIAGNOSIS — L814 Other melanin hyperpigmentation: Secondary | ICD-10-CM | POA: Diagnosis not present

## 2019-04-09 DIAGNOSIS — L820 Inflamed seborrheic keratosis: Secondary | ICD-10-CM | POA: Diagnosis not present

## 2019-04-09 DIAGNOSIS — D1801 Hemangioma of skin and subcutaneous tissue: Secondary | ICD-10-CM | POA: Diagnosis not present

## 2019-04-09 DIAGNOSIS — D485 Neoplasm of uncertain behavior of skin: Secondary | ICD-10-CM | POA: Diagnosis not present

## 2019-04-09 DIAGNOSIS — Z85828 Personal history of other malignant neoplasm of skin: Secondary | ICD-10-CM | POA: Diagnosis not present

## 2019-04-09 DIAGNOSIS — L7211 Pilar cyst: Secondary | ICD-10-CM | POA: Diagnosis not present

## 2019-04-09 DIAGNOSIS — L821 Other seborrheic keratosis: Secondary | ICD-10-CM | POA: Diagnosis not present

## 2019-04-09 DIAGNOSIS — C44329 Squamous cell carcinoma of skin of other parts of face: Secondary | ICD-10-CM | POA: Diagnosis not present

## 2019-04-28 DIAGNOSIS — Z85828 Personal history of other malignant neoplasm of skin: Secondary | ICD-10-CM | POA: Diagnosis not present

## 2019-04-28 DIAGNOSIS — C44311 Basal cell carcinoma of skin of nose: Secondary | ICD-10-CM | POA: Diagnosis not present

## 2019-04-29 ENCOUNTER — Telehealth: Payer: Self-pay | Admitting: Primary Care

## 2019-04-29 DIAGNOSIS — Z1231 Encounter for screening mammogram for malignant neoplasm of breast: Secondary | ICD-10-CM

## 2019-04-29 DIAGNOSIS — E2839 Other primary ovarian failure: Secondary | ICD-10-CM

## 2019-04-29 NOTE — Telephone Encounter (Signed)
Noted, orders placed. 

## 2019-04-29 NOTE — Telephone Encounter (Signed)
Updated immunizations

## 2019-04-29 NOTE — Telephone Encounter (Signed)
Pt has bone density and mammogram scheduled @ [facility] 3/4.  Needs order sent Please advise pt when order has been faxed  Pt also wants to let you know she received both vaccine for covid Pfizer   Last one 2/5 First 1/15

## 2019-05-06 ENCOUNTER — Other Ambulatory Visit: Payer: Self-pay | Admitting: Primary Care

## 2019-05-06 ENCOUNTER — Telehealth: Payer: Self-pay

## 2019-05-06 DIAGNOSIS — K219 Gastro-esophageal reflux disease without esophagitis: Secondary | ICD-10-CM

## 2019-05-06 NOTE — Telephone Encounter (Signed)
Per pt she has Mammogram and DEXA [email or web address]  05-21-19. Order faxed to [facility].

## 2019-06-09 ENCOUNTER — Other Ambulatory Visit: Payer: Self-pay | Admitting: Primary Care

## 2019-06-09 DIAGNOSIS — E039 Hypothyroidism, unspecified: Secondary | ICD-10-CM

## 2019-06-16 ENCOUNTER — Other Ambulatory Visit: Payer: Self-pay | Admitting: Primary Care

## 2019-06-16 DIAGNOSIS — I10 Essential (primary) hypertension: Secondary | ICD-10-CM

## 2019-06-23 ENCOUNTER — Encounter: Payer: Self-pay | Admitting: Primary Care

## 2019-06-23 DIAGNOSIS — M8589 Other specified disorders of bone density and structure, multiple sites: Secondary | ICD-10-CM | POA: Diagnosis not present

## 2019-06-23 DIAGNOSIS — Z1231 Encounter for screening mammogram for malignant neoplasm of breast: Secondary | ICD-10-CM | POA: Diagnosis not present

## 2019-06-23 LAB — HM MAMMOGRAPHY

## 2019-06-25 ENCOUNTER — Encounter: Payer: Self-pay | Admitting: Primary Care

## 2019-07-01 ENCOUNTER — Ambulatory Visit (INDEPENDENT_AMBULATORY_CARE_PROVIDER_SITE_OTHER): Payer: Medicare Other | Admitting: Podiatry

## 2019-07-01 ENCOUNTER — Encounter: Payer: Self-pay | Admitting: Podiatry

## 2019-07-01 ENCOUNTER — Other Ambulatory Visit: Payer: Self-pay

## 2019-07-01 ENCOUNTER — Ambulatory Visit: Payer: Self-pay | Admitting: Podiatry

## 2019-07-01 DIAGNOSIS — M79676 Pain in unspecified toe(s): Secondary | ICD-10-CM | POA: Diagnosis not present

## 2019-07-01 DIAGNOSIS — B351 Tinea unguium: Secondary | ICD-10-CM

## 2019-07-01 NOTE — Progress Notes (Signed)
Subjective:  Patient ID: Ashlee Lopez, female    DOB: 02/12/1934,  MRN: [id number] HPI Chief Complaint  Patient presents with  . Nail Problem    Hallux nail right - toenail is thick and discolored x years, keeps breaking apart and unable to trim  . New Patient (Initial Visit)    86 y.o. female presents with the above complaint.   ROS: Denies fever chills nausea vomiting muscle aches pains calf pain back pain chest pain shortness of breath.  Past Medical History:  Diagnosis Date  . Benign essential HTN 03/21/2011  . Breast cancer, stage 1 ([place]) 02/10/2003   Right tubular breast cancer  . Cancer ([place])   . Diverticulosis of colon 03/23/2011  . Family history of breast cancer   . Family history of colon cancer   . Family history of melanoma   . Family history of prostate cancer   . Fibrocystic disease of breast 03/23/2011  . Graves' disease with exophthalmos 03/23/2011  . Hypertension   . Hypothyroid 03/21/2011  . IBS (irritable bowel syndrome) 03/23/2011  . ITP (idiopathic thrombocytopenic purpura) 03/21/2011  . S/P splenectomy 03/21/2011  . Uterus cancer ([place]) 03/23/1999   Well differentiated AdenoCA of endometrium-superficially confined  . Varicose vein of leg 03/23/2011   Past Surgical History:  Procedure Laterality Date  . ABDOMINAL HYSTERECTOMY  2001  . APPENDECTOMY  1941  . BREAST SURGERY  02/17/2003   Mastectomy-Right  . MASTECTOMY  2004   Dr Margot Chimes  . OOPHORECTOMY     BSO  . SKIN CANCER EXCISION  2019   removal of cancer from ear  . SPLENECTOMY  1986    Current Outpatient Medications:  .  amLODipine (NORVASC) 10 MG tablet, Take 1 tablet (10 mg total) by mouth daily. For blood pressure., Disp: 90 tablet, Rfl: 3 .  aspirin 81 MG tablet, Take 81 mg by mouth daily.  , Disp: , Rfl:  .  Calcium Carbonate-Vitamin D 600-200 MG-UNIT TABS, Take by mouth., Disp: , Rfl:  .  CALCIUM-VITAMIN D PO, Take by mouth.  , Disp: , Rfl:  .  conjugated estrogens (PREMARIN) vaginal cream, Apply  0.5mg  (pea-sized amount)  just inside the vaginal introitus with a finger-tip on  Monday, Wednesday and Friday nights., Disp: 30 g, Rfl: 12 .  CRANBERRY PO, Take 3 tablets by mouth daily., Disp: , Rfl:  .  Diphenhydramine-APAP, sleep, (TYLENOL PM EXTRA STRENGTH PO), Take by mouth.  , Disp: , Rfl:  .  doxycycline (VIBRAMYCIN) 100 MG capsule, Take 100 mg by mouth 2 (two) times daily., Disp: , Rfl:  .  hydrochlorothiazide (HYDRODIURIL) 25 MG tablet, TAKE 1 TABLET BY MOUTH ONCE DAILY FOR BLOOD PRESSURE, Disp: 90 tablet, Rfl: 1 .  levothyroxine (SYNTHROID) 100 MCG tablet, TAKE 1 TAB BY MOUTH ONCE DAILY. TAKE ON AN EMPTY STOMACH WITH A GLASS OF WATER ATLEAST 30-60 MINUTES BEFORE BREAKFAST, Disp: 90 tablet, Rfl: 1 .  losartan (COZAAR) 50 MG tablet, Take 1 tablet (50 mg total) by mouth daily. For blood pressure., Disp: 90 tablet, Rfl: 3 .  metoprolol tartrate (LOPRESSOR) 50 MG tablet, TAKE 1 TABLET BY MOUTH TWICE A DAY, Disp: 180 tablet, Rfl: 1 .  Multiple Vitamin (MULTI-VITAMIN PO), Take by mouth.  , Disp: , Rfl:  .  omeprazole (PRILOSEC) 20 MG capsule, TAKE 1 CAPSULE BY MOUTH ONCE DAILY FOR HEARTBURN, Disp: 90 capsule, Rfl: 0 .  rosuvastatin (CRESTOR) 5 MG tablet, TAKE 1 TABLET BY MOUTH EVERY OTHER DAY, Disp: 90 tablet, Rfl:  1 .  solifenacin (VESICARE) 5 MG tablet, Take 1 tablet (5 mg total) by mouth daily., Disp: 30 tablet, Rfl: 3  Allergies  Allergen Reactions  . Sulfamethoxazole-Trimethoprim Hives  . Septra [Bactrim]    Review of Systems Objective:  There were no vitals filed for this visit.  General: Well developed, nourished, in no acute distress, alert and oriented x3   Dermatological: Skin is warm, dry and supple bilateral. Nails x 10 are thick yellow dystrophic-like mycotic particularly the hallux right with nail dystrophy.; remaining integument appears unremarkable at this time. There are no open sores, no preulcerative lesions, no rash or signs of infection present.  Vascular: Dorsalis  Pedis artery and Posterior Tibial artery pedal pulses are 2/4 bilateral with immedate capillary fill time. Pedal hair growth present. No varicosities and no lower extremity edema present bilateral.   Neruologic: Grossly intact via light touch bilateral. Vibratory intact via tuning fork bilateral. Protective threshold with Semmes Wienstein monofilament intact to all pedal sites bilateral. Patellar and Achilles deep tendon reflexes 2+ bilateral. No Babinski or clonus noted bilateral.   Musculoskeletal: No gross boney pedal deformities bilateral. No pain, crepitus, or limitation noted with foot and ankle range of motion bilateral. Muscular strength 5/5 in all groups tested bilateral.  Gait: Unassisted, Nonantalgic.    Radiographs:  None taken  Assessment & Plan:   Assessment: Nail dystrophy, pain in limb secondary to onychomycosis hallux right.  Plan: Nails were trimmed today 1 through 5 bilaterally no open lesions or wounds noted.     Corderro Koloski T. [place], [place]

## 2019-07-31 ENCOUNTER — Ambulatory Visit (INDEPENDENT_AMBULATORY_CARE_PROVIDER_SITE_OTHER): Payer: Medicare Other | Admitting: Urology

## 2019-07-31 ENCOUNTER — Other Ambulatory Visit: Payer: Self-pay

## 2019-07-31 ENCOUNTER — Encounter: Payer: Self-pay | Admitting: Urology

## 2019-07-31 VITALS — BP 168/83 | HR 80 | Ht 64.0 in | Wt 149.0 lb

## 2019-07-31 DIAGNOSIS — R350 Frequency of micturition: Secondary | ICD-10-CM

## 2019-07-31 DIAGNOSIS — R3915 Urgency of urination: Secondary | ICD-10-CM

## 2019-07-31 NOTE — Patient Instructions (Signed)
Percutaneous Nerve Evaluation for Sacral Nerve Stimulation  Tibial nerve stimulation is a treatment that uses an implanted device that sends mild electrical impulses to the sacral nerves. The sacral nerves control several functions in the lower part of the body, including bladder and bowel functions. Sacral nerve stimulation can be used to treat disorders that make it hard to control urination and bowel movements.   This trial helps determine if sacral nerve stimulation will help your condition. For the trial, a procedure will be done to insert temporary wires into your body. Electric pulses will travel through these wires (electrodes) to an area close to your sacral nerves.  ?  Summary  Tibial nerve stimulation is a treatment that uses an implanted device that sends mild electrical impulses to the sacral nerves.  This procedure will determine if a sacral nerve stimulator will help you. If so, you will need to have a second procedure to have a permanent device implanted.  The sacral nerves control several functions in the lower part of the body, including bladder and bowel functions.  Sacral nerve stimulation can be used to treat disorders that make it hard to control urination and bowel movements. This information is not intended to replace advice given to you by your health care provider. Make sure you discuss any questions you have with your health care provider. Document Revised: 04/28/2018 Document Reviewed: 04/28/2018 Elsevier Patient Education  [employer].

## 2019-07-31 NOTE — Progress Notes (Signed)
07/31/2019 3:07 PM   Ashlee Lopez [birth year] [id number]  Referring provider: Pleas Koch, NP [place],  [place] [address]  Chief Complaint  Patient presents with  . Follow-up  . Urinary Frequency    HPI:  Ashlee.Lopez is a patient followedfor OAB symptoms,mixed incontinenceand recurrent UTI. She has a history of dysuria and mixed incontinence, but also asymptomatic bacteriuria. Low dose abx suppression was discussed but not started.She followed up with PA McGowanand had a normal pelvic exam exhibiting atrophic vaginitis. PA McGowan put her on probiotics, vitamin C and cranberry supplements as well as transvaginal estrogen. Renal ultrasound was normal March 28, 2018. PVR has been normal.   She continued to havefrequency and urgency. She wears a few ppd due to UUI. She has occasional urine odor and cloudiness. No dysuria.She started Myrbetriq 25 mg. She has nocturia. She has LE edema and hasn't been wearing pressure stocking.  She had + klebsiella urine cx Sep 2020. PVR normal. She had dysuria Sep 2020 which cleared with increased water intake. Cx was mixed growth. She has frequency and urgency. She stopped Myrbetriq. Sometimes urine odor and cloudiness come and go. No gross hematuria.   PMH: Past Medical History:  Diagnosis Date  . Benign essential HTN 03/21/2011  . Breast cancer, stage 1 ([place]) 02/10/2003   Right tubular breast cancer  . Cancer ([place])   . Diverticulosis of colon 03/23/2011  . Family history of breast cancer   . Family history of colon cancer   . Family history of melanoma   . Family history of prostate cancer   . Fibrocystic disease of breast 03/23/2011  . Graves' disease with exophthalmos 03/23/2011  . Hypertension   . Hypothyroid 03/21/2011  . IBS (irritable bowel syndrome) 03/23/2011  . ITP (idiopathic thrombocytopenic purpura) 03/21/2011  . S/P splenectomy 03/21/2011  . Uterus cancer ([place]) 03/23/1999   Well differentiated AdenoCA of  endometrium-superficially confined  . Varicose vein of leg 03/23/2011    Surgical History: Past Surgical History:  Procedure Laterality Date  . ABDOMINAL HYSTERECTOMY  2001  . APPENDECTOMY  1941  . BREAST SURGERY  02/17/2003   Mastectomy-Right  . MASTECTOMY  2004   Dr Margot Chimes  . OOPHORECTOMY     BSO  . SKIN CANCER EXCISION  2019   removal of cancer from ear  . SPLENECTOMY  1986    Home Medications:  Allergies as of 07/31/2019      Reactions   Sulfamethoxazole-trimethoprim Hives   Septra [bactrim]       Medication List       Accurate as of Jul 31, 2019  3:07 PM. If you have any questions, ask your nurse or doctor.        amLODipine 10 MG tablet Commonly known as: NORVASC Take 1 tablet (10 mg total) by mouth daily. For blood pressure.   aspirin 81 MG tablet Take 81 mg by mouth daily.   Calcium Carbonate-Vitamin D 600-200 MG-UNIT Tabs Take by mouth.   CALCIUM-VITAMIN D PO Take by mouth.   conjugated estrogens vaginal cream Commonly known as: Premarin Apply 0.5mg  (pea-sized amount)  just inside the vaginal introitus with a finger-tip on  Monday, Wednesday and Friday nights.   CRANBERRY PO Take 3 tablets by mouth daily.   doxycycline 100 MG capsule Commonly known as: VIBRAMYCIN Take 100 mg by mouth 2 (two) times daily.   hydrochlorothiazide 25 MG tablet Commonly known as: HYDRODIURIL TAKE 1 TABLET BY MOUTH ONCE DAILY FOR BLOOD  PRESSURE   levothyroxine 100 MCG tablet Commonly known as: SYNTHROID TAKE 1 TAB BY MOUTH ONCE DAILY. TAKE ON AN EMPTY STOMACH WITH A GLASS OF WATER ATLEAST 30-60 MINUTES BEFORE BREAKFAST   losartan 50 MG tablet Commonly known as: COZAAR Take 1 tablet (50 mg total) by mouth daily. For blood pressure.   metoprolol tartrate 50 MG tablet Commonly known as: LOPRESSOR TAKE 1 TABLET BY MOUTH TWICE A DAY   MULTI-VITAMIN PO Take by mouth.   omeprazole 20 MG capsule Commonly known as: PRILOSEC TAKE 1 CAPSULE BY MOUTH ONCE DAILY FOR  HEARTBURN   rosuvastatin 5 MG tablet Commonly known as: CRESTOR TAKE 1 TABLET BY MOUTH EVERY OTHER DAY   solifenacin 5 MG tablet Commonly known as: VESICARE Take 1 tablet (5 mg total) by mouth daily.   TYLENOL PM EXTRA STRENGTH PO Take by mouth.       Allergies:  Allergies  Allergen Reactions  . Sulfamethoxazole-Trimethoprim Hives  . Septra [Bactrim]     Family History: Family History  Problem Relation Age of Onset  . Breast cancer Mother        Age 43  . Hypertension Father   . Heart disease Father   . Lung cancer Father   . Breast cancer Sister        Age 64  . Melanoma Sister        dx in her 17s  . Dementia Sister   . Heart disease Maternal Aunt   . Prostate cancer Maternal Uncle   . Colon cancer Maternal Grandfather   . Colon cancer Maternal Uncle   . Prostate cancer Maternal Uncle   . Dementia Maternal Aunt   . Breast cancer Cousin        maternal 2nd cousin  . Breast cancer Cousin        maternal first cousin dx in her 69s  . Melanoma Son        dx in his 58s    Social History:  reports that she has never smoked. She has never used smokeless tobacco. She reports that she does not drink alcohol or use drugs.   Physical Exam: BP (!) 168/83   Pulse 80   Ht 5\' 4"  (1.626 m)   Wt 149 lb (67.6 kg)   BMI 25.58 kg/m   Constitutional:  Alert and oriented, No acute distress. HEENT: [place] AT, moist mucus membranes.  Trachea midline, no masses. Cardiovascular: No clubbing, cyanosis, or edema. Respiratory: Normal respiratory effort, no increased work of breathing. GI: Abdomen is soft, nontender, nondistended, no abdominal masses GU: No CVA tenderness Lymph: No cervical or inguinal lymphadenopathy. Skin: No rashes, bruises or suspicious lesions. Neurologic: Grossly intact, no focal deficits, moving all 4 extremities. Psychiatric: Normal mood and affect.  Laboratory Data: Lab Results  Component Value Date   WBC 5.3 12/10/2018   HGB 12.5 12/10/2018   HCT  37.2 12/10/2018   MCV 95 12/10/2018   PLT 285 12/10/2018    Lab Results  Component Value Date   CREATININE 0.74 12/10/2018    No results found for: PSA  No results found for: TESTOSTERONE  Lab Results  Component Value Date   HGBA1C 5.7 (A) 12/17/2018    Urinalysis    Component Value Date/Time   APPEARANCEUR Cloudy (A) 01/07/2019 1525   GLUCOSEU Trace (A) 01/07/2019 1525   BILIRUBINUR Negative 01/07/2019 1525   PROTEINUR 1+ (A) 01/07/2019 1525   UROBILINOGEN 0.2 12/24/2017 0830   NITRITE Positive (A) 01/07/2019 1525  LEUKOCYTESUR 3+ (A) 01/07/2019 1525    Lab Results  Component Value Date   LABMICR See below: 01/07/2019   WBCUA >30 (A) 01/07/2019   RBCUA 0-2 05/09/2018   LABEPIT 0-10 01/07/2019   MUCUS Present (A) 05/09/2018   BACTERIA Many (A) 01/07/2019    Pertinent Imaging: n/a No results found for this or any previous visit. No results found for this or any previous visit. No results found for this or any previous visit. No results found for this or any previous visit. Results for orders placed during the hospital encounter of 03/28/18  US RENAL   Narrative CLINICAL DATA:  Recurrent UTI  EXAM: RENAL / URINARY TRACT ULTRASOUND COMPLETE  COMPARISON:  None  FINDINGS: Right Kidney:  Renal measurements: 9.7 x 3.5 x 5.0 cm = volume: 87.4 mL. Normal cortical thickness. Increased cortical echogenicity. No mass, hydronephrosis or shadowing calcification.  Left Kidney:  Renal measurements: 10.4 x 5.6 x 4.6 cm = volume: 132.4 mL. Normal cortical thickness. Increased cortical echogenicity. Simple appearing cyst at inferior pole 2.9 x 3.2 x 3.1 cm. Tiny cyst at medial aspect of mid upper pole 1.5 x 0.9 x 1.0 cm. No hydronephrosis or shadowing calcification.  Bladder:  Appears normal for degree of bladder distention. BILATERAL ureteral jets seen.  IMPRESSION: Medical renal disease changes of both kidneys.  Two LEFT renal cysts.  Otherwise  negative exam.   Electronically Signed   By: Lavonia Dana M.D.   On: 03/28/2018 15:06    No results found for this or any previous visit. No results found for this or any previous visit. No results found for this or any previous visit.  Assessment & Plan:    1. Urinary frequency Discussed combo medicine. Standing triggers her urgency. Discussed PT or PTNS. She doesn't want meds, so PT and PTNS are good options. We will start PTNS. F/u 6 months.  - Urinalysis, Complete   No follow-ups on file.  Festus Aloe, MD  [facility] [address] [place], [place] [address] [phone]

## 2019-08-01 LAB — URINALYSIS, COMPLETE
Bilirubin, UA: NEGATIVE
Glucose, UA: NEGATIVE
Ketones, UA: NEGATIVE
Nitrite, UA: POSITIVE — AB
Protein,UA: NEGATIVE
Specific Gravity, UA: 1.02 (ref 1.005–1.030)
Urobilinogen, Ur: 0.2 mg/dL (ref 0.2–1.0)
pH, UA: 7 (ref 5.0–7.5)

## 2019-08-01 LAB — MICROSCOPIC EXAMINATION: WBC, UA: >30 /hpf — AB (ref 0–5)

## 2019-08-03 ENCOUNTER — Other Ambulatory Visit: Payer: Self-pay | Admitting: Primary Care

## 2019-08-03 ENCOUNTER — Telehealth: Payer: Self-pay | Admitting: *Deleted

## 2019-08-03 ENCOUNTER — Telehealth: Payer: Self-pay | Admitting: Urology

## 2019-08-03 DIAGNOSIS — K219 Gastro-esophageal reflux disease without esophagitis: Secondary | ICD-10-CM

## 2019-08-03 NOTE — Telephone Encounter (Signed)
Can scheduled PTNS . No prior auth needed

## 2019-08-03 NOTE — Telephone Encounter (Signed)
Can scheduled ptns on Monday's . Approved . Left message

## 2019-08-03 NOTE — Telephone Encounter (Signed)
Pt left vm returning a call °

## 2019-08-05 LAB — CULTURE, URINE COMPREHENSIVE

## 2019-08-06 ENCOUNTER — Telehealth: Payer: Self-pay

## 2019-08-06 NOTE — Telephone Encounter (Signed)
Patient left message looking for Culture results

## 2019-08-07 NOTE — Telephone Encounter (Signed)
-----   Message from [place], [place] sent at 08/06/2019 10:32 AM EDT -----  ----- Message ----- From: Chrystie Nose, CMA Sent: 08/06/2019   7:48 AM EDT To: Alvera Novel, CMA   ----- Message ----- From: Festus Aloe, MD Sent: 08/05/2019   9:50 PM EDT To: Chrystie Nose, CMA  Let Ms Deyjah know her urine cx was positive but as usual she doesn't need antibiotics unless she has symptoms of a bladder infections such as painful urination, bladder pain, fever etc.  ----- Message ----- From: Chrystie Nose, CMA Sent: 08/04/2019   7:32 AM EDT To: Festus Aloe, MD   ----- Message ----- From: Interface, [facility] Lab Results In Sent: 08/01/2019   5:38 AM EDT To: Rowe Robert Clinical

## 2019-08-07 NOTE — Telephone Encounter (Signed)
Notified patient as advised. Ashlee Lopez denies any fever, bladder pain, painful urination or any other symptoms. Patient agrees No ABX necessary at this time.

## 2019-08-10 ENCOUNTER — Other Ambulatory Visit: Payer: Self-pay | Admitting: Family Medicine

## 2019-08-10 ENCOUNTER — Telehealth: Payer: Self-pay | Admitting: Family Medicine

## 2019-08-10 MED ORDER — NITROFURANTOIN MONOHYD MACRO 100 MG PO CAPS: 100.0000 mg | ORAL_CAPSULE | Freq: Two times a day (BID) | ORAL | 0 refills | Status: DC

## 2019-08-10 NOTE — Telephone Encounter (Signed)
I spoke to sam about patient's symptoms. Patient states the symptoms began yesterday. Macrobid 100mg  was sent to the pharmacy. Patient was notified and voiced understanding.

## 2019-08-10 NOTE — Telephone Encounter (Signed)
Patient states she is in pain and was told he has a UTI but no ABX were sent to the pharmacy. Would you like to send something in for her?

## 2019-09-01 ENCOUNTER — Telehealth: Payer: Self-pay | Admitting: Primary Care

## 2019-09-01 NOTE — Telephone Encounter (Signed)
Received a fax from [facility] [employer] for DME. Place in [place].

## 2019-09-03 ENCOUNTER — Other Ambulatory Visit: Payer: Self-pay | Admitting: Primary Care

## 2019-09-03 DIAGNOSIS — I10 Essential (primary) hypertension: Secondary | ICD-10-CM

## 2019-09-03 DIAGNOSIS — E039 Hypothyroidism, unspecified: Secondary | ICD-10-CM

## 2019-09-03 NOTE — Telephone Encounter (Signed)
Completed and placed in Chans inbox. 

## 2019-09-04 ENCOUNTER — Ambulatory Visit (INDEPENDENT_AMBULATORY_CARE_PROVIDER_SITE_OTHER): Payer: Medicare Other | Admitting: Urology

## 2019-09-04 ENCOUNTER — Other Ambulatory Visit: Payer: Self-pay

## 2019-09-04 ENCOUNTER — Encounter: Payer: Self-pay | Admitting: Urology

## 2019-09-04 VITALS — BP 160/86 | HR 80 | Ht 64.0 in | Wt 147.0 lb

## 2019-09-04 DIAGNOSIS — R3915 Urgency of urination: Secondary | ICD-10-CM

## 2019-09-04 DIAGNOSIS — R350 Frequency of micturition: Secondary | ICD-10-CM | POA: Diagnosis not present

## 2019-09-04 NOTE — Patient Instructions (Signed)

## 2019-09-04 NOTE — Telephone Encounter (Signed)
Faxed as requested

## 2019-09-04 NOTE — Progress Notes (Signed)
09/04/2019 1:16 PM   Ashlee Lopez May 08, 1933 [id number]  Referring provider: Pleas Koch, NP [place],  [place] [address]  Chief Complaint  Patient presents with   Urinary Tract Infection    pain, and fullness   Urinary Frequency    HPI:  Kehinde returns with urinary frequency.  She was concerned about a UTI. Last night she had nocturia. She had bladder pressure. Mild dysuria. Took Azo. No fever or gross hematuria. For past three days. She is not on medicine for her bladder.  She stopped Myrbetriq. Solifen was expensive. She didn't want to take further meds.    PMH: Past Medical History:  Diagnosis Date   Benign essential HTN 03/21/2011   Breast cancer, stage 1 ([place]) 02/10/2003   Right tubular breast cancer   Cancer ([place])    Diverticulosis of colon 03/23/2011   Family history of breast cancer    Family history of colon cancer    Family history of melanoma    Family history of prostate cancer    Fibrocystic disease of breast 03/23/2011   Graves' disease with exophthalmos 03/23/2011   Hypertension    Hypothyroid 03/21/2011   IBS (irritable bowel syndrome) 03/23/2011   ITP (idiopathic thrombocytopenic purpura) 03/21/2011   S/P splenectomy 03/21/2011   Uterus cancer ([place]) 03/23/1999   Well differentiated AdenoCA of endometrium-superficially confined   Varicose vein of leg 03/23/2011    Surgical History: Past Surgical History:  Procedure Laterality Date   ABDOMINAL HYSTERECTOMY  2001   APPENDECTOMY  1941   BREAST SURGERY  02/17/2003   Mastectomy-Right   MASTECTOMY  2004   Dr Margot Chimes   OOPHORECTOMY     BSO   SKIN CANCER EXCISION  2019   removal of cancer from ear   SPLENECTOMY  1986    Home Medications:  Allergies as of 09/04/2019      Reactions   Sulfamethoxazole-trimethoprim Hives   Septra [bactrim]       Medication List       Accurate as of September 04, 2019  1:16 PM. If you have any questions, ask your nurse or doctor.          STOP taking these medications   nitrofurantoin (macrocrystal-monohydrate) 100 MG capsule Commonly known as: MACROBID Stopped by: Festus Aloe, MD     TAKE these medications   amLODipine 10 MG tablet Commonly known as: NORVASC TAKE 1 TABLET BY MOUTH DAILY FOR BLOOD PRESSURE   aspirin 81 MG tablet Take 81 mg by mouth daily.   Calcium Carbonate-Vitamin D 600-200 MG-UNIT Tabs Take by mouth.   CALCIUM-VITAMIN D PO Take by mouth.   conjugated estrogens vaginal cream Commonly known as: Premarin Apply 0.5mg  (pea-sized amount)  just inside the vaginal introitus with a finger-tip on  Monday, Wednesday and Friday nights.   CRANBERRY PO Take 3 tablets by mouth daily.   doxycycline 100 MG capsule Commonly known as: VIBRAMYCIN Take 100 mg by mouth 2 (two) times daily.   hydrochlorothiazide 25 MG tablet Commonly known as: HYDRODIURIL TAKE 1 TABLET BY MOUTH ONCE DAILY FOR BLOOD PRESSURE   levothyroxine 100 MCG tablet Commonly known as: SYNTHROID TAKE 1 TAB BY MOUTH ONCE DAILY. TAKE ON AN EMPTY STOMACH WITH A GLASS OF WATER ATLEAST 30-60 MINUTES BEFORE BREAKFAST   losartan 50 MG tablet Commonly known as: COZAAR Take 1 tablet (50 mg total) by mouth daily. For blood pressure.   metoprolol tartrate 50 MG tablet Commonly known as: LOPRESSOR TAKE  1 TABLET BY MOUTH TWICE A DAY   MULTI-VITAMIN PO Take by mouth.   omeprazole 20 MG capsule Commonly known as: PRILOSEC TAKE 1 CAPSULE BY MOUTH ONCE DAILY FOR HEARTBURN   rosuvastatin 5 MG tablet Commonly known as: CRESTOR TAKE 1 TABLET BY MOUTH EVERY OTHER DAY   solifenacin 5 MG tablet Commonly known as: VESICARE Take 1 tablet (5 mg total) by mouth daily.   TYLENOL PM EXTRA STRENGTH PO Take by mouth.       Allergies:  Allergies  Allergen Reactions   Sulfamethoxazole-Trimethoprim Hives   Septra [Bactrim]     Family History: Family History  Problem Relation Age of Onset   Breast cancer Mother        Age 71    Hypertension Father    Heart disease Father    Lung cancer Father    Breast cancer Sister        Age 61   Melanoma Sister        dx in her 70s   Dementia Sister    Heart disease Maternal Aunt    Prostate cancer Maternal Uncle    Colon cancer Maternal Grandfather    Colon cancer Maternal Uncle    Prostate cancer Maternal Uncle    Dementia Maternal Aunt    Breast cancer Cousin        maternal 2nd cousin   Breast cancer Cousin        maternal first cousin dx in her 74s   Melanoma Son        dx in his 81s    Social History:  reports that she has never smoked. She has never used smokeless tobacco. She reports that she does not drink alcohol and does not use drugs.   Physical Exam: BP (!) 160/86    Pulse 80    Ht 5\' 4"  (1.626 m)    Wt 147 lb (66.7 kg)    BMI 25.23 kg/m   Constitutional:  Alert and oriented, No acute distress. HEENT: [place] AT, moist mucus membranes.  Trachea midline, no masses. Cardiovascular: No clubbing, cyanosis, or edema. Respiratory: Normal respiratory effort, no increased work of breathing. GI: Abdomen is soft, nontender, nondistended, no abdominal masses GU: No CVA tenderness Lymph: No cervical or inguinal lymphadenopathy. Skin: No rashes, bruises or suspicious lesions. Neurologic: Grossly intact, no focal deficits, moving all 4 extremities. Psychiatric: Normal mood and affect.  Laboratory Data: Lab Results  Component Value Date   WBC 5.3 12/10/2018   HGB 12.5 12/10/2018   HCT 37.2 12/10/2018   MCV 95 12/10/2018   PLT 285 12/10/2018    Lab Results  Component Value Date   CREATININE 0.74 12/10/2018    No results found for: PSA  No results found for: TESTOSTERONE  Lab Results  Component Value Date   HGBA1C 5.7 (A) 12/17/2018    Urinalysis    Component Value Date/Time   APPEARANCEUR Cloudy (A) 07/31/2019 1558   GLUCOSEU Negative 07/31/2019 1558   BILIRUBINUR Negative 07/31/2019 1558   PROTEINUR Negative 07/31/2019 1558    UROBILINOGEN 0.2 12/24/2017 0830   NITRITE Positive (A) 07/31/2019 1558   LEUKOCYTESUR 1+ (A) 07/31/2019 1558    Lab Results  Component Value Date   LABMICR See below: 07/31/2019   WBCUA >30 (A) 07/31/2019   RBCUA 0-2 05/09/2018   LABEPIT 0-10 07/31/2019   MUCUS Present (A) 05/09/2018   BACTERIA Many (A) 07/31/2019    Pertinent Imaging: n/a No results found for this or any previous  visit.  No results found for this or any previous visit.  No results found for this or any previous visit.  No results found for this or any previous visit.  Results for orders placed during the hospital encounter of 03/28/18  US RENAL  Narrative CLINICAL DATA:  Recurrent UTI  EXAM: RENAL / URINARY TRACT ULTRASOUND COMPLETE  COMPARISON:  None  FINDINGS: Right Kidney:  Renal measurements: 9.7 x 3.5 x 5.0 cm = volume: 87.4 mL. Normal cortical thickness. Increased cortical echogenicity. No mass, hydronephrosis or shadowing calcification.  Left Kidney:  Renal measurements: 10.4 x 5.6 x 4.6 cm = volume: 132.4 mL. Normal cortical thickness. Increased cortical echogenicity. Simple appearing cyst at inferior pole 2.9 x 3.2 x 3.1 cm. Tiny cyst at medial aspect of mid upper pole 1.5 x 0.9 x 1.0 cm. No hydronephrosis or shadowing calcification.  Bladder:  Appears normal for degree of bladder distention. BILATERAL ureteral jets seen.  IMPRESSION: Medical renal disease changes of both kidneys.  Two LEFT renal cysts.  Otherwise negative exam.   Electronically Signed By: Lavonia Dana M.D. On: 03/28/2018 15:06  No results found for this or any previous visit.  No results found for this or any previous visit.  No results found for this or any previous visit.   Assessment & Plan:    1. Urinary frequency, urge - she cant leave a specimen and is drinking water. Send UA and Cx. Try Uribel. F/u for PTNS.   - Urinalysis, Complete - CULTURE, URINE COMPREHENSIVE - BLADDER SCAN AMB  NON-IMAGING   No follow-ups on file.  Festus Aloe, MD  [facility] [address], [place], [place] [address] [phone]

## 2019-09-07 LAB — MICROSCOPIC EXAMINATION
Epithelial Cells (non renal): >10 /HPF — AB (ref 0–10)
Renal Epithel, UA: >10 /HPF — AB
WBC, UA: >30 /HPF — AB (ref 0–5)

## 2019-09-07 LAB — URINALYSIS, COMPLETE
Bilirubin, UA: NEGATIVE
Glucose, UA: NEGATIVE
Ketones, UA: NEGATIVE
Nitrite, UA: POSITIVE — AB
Specific Gravity, UA: 1.02 (ref 1.005–1.030)
Urobilinogen, Ur: 0.2 mg/dL (ref 0.2–1.0)
pH, UA: 7 (ref 5.0–7.5)

## 2019-09-09 ENCOUNTER — Telehealth: Payer: Self-pay | Admitting: Family Medicine

## 2019-09-09 LAB — CULTURE, URINE COMPREHENSIVE

## 2019-09-09 MED ORDER — NITROFURANTOIN MONOHYD MACRO 100 MG PO CAPS
100.0000 mg | ORAL_CAPSULE | Freq: Two times a day (BID) | ORAL | 0 refills | Status: DC
Start: 2019-09-09 — End: 2019-11-11

## 2019-09-09 NOTE — Telephone Encounter (Signed)
LMOM to pick up ABX at pharmacy for UTI.

## 2019-09-09 NOTE — Telephone Encounter (Signed)
Patient called questioning the result of the urine culture. Please review and advise.

## 2019-09-09 NOTE — Telephone Encounter (Signed)
This is an [facility] patient that is why results have been delayed  Urine does show an infection, please start nitrofurantoin 100 mg twice daily x5 days  Nickolas Madrid, MD 09/09/2019

## 2019-09-11 ENCOUNTER — Telehealth: Payer: Self-pay

## 2019-09-11 NOTE — Telephone Encounter (Signed)
-----   Message from Chrystie Nose, [place] sent at 09/11/2019  7:54 AM EDT -----  ----- Message ----- From: Festus Aloe, MD Sent: 09/10/2019   5:36 PM EDT To: Chrystie Nose, CMA  Janett Billow - let Ms. Aimi know her urine cx is +, but as usual she does not need abx if no dysuria, fever, bladder pain etc.  ----- Message ----- From: Chrystie Nose, CMA Sent: 09/09/2019   1:40 PM EDT To: Festus Aloe, MD   ----- Message ----- From: Interface, [facility] Lab Results In Sent: 09/06/2019   9:35 AM EDT To: Rowe Robert Clinical

## 2019-09-11 NOTE — Telephone Encounter (Signed)
Notified patient as advised. Patient expressed some mild dysuria. Dr Diamantina Providence had already sent in an ABX bc of delayed culture results, patient confirmed they already started ABX.

## 2019-09-14 ENCOUNTER — Other Ambulatory Visit: Payer: Self-pay

## 2019-09-14 ENCOUNTER — Ambulatory Visit (INDEPENDENT_AMBULATORY_CARE_PROVIDER_SITE_OTHER): Payer: Medicare Other

## 2019-09-14 DIAGNOSIS — R350 Frequency of micturition: Secondary | ICD-10-CM | POA: Diagnosis not present

## 2019-09-14 NOTE — Progress Notes (Signed)
PTNS  Session # 1  Health & Social Factors: no change Caffeine: 0 Alcohol: 0 Daytime voids #per day: 8 Night-time voids #per night: 4 Urgency: mild Incontinence Episodes #per day: 0-1 week Ankle used: left Treatment Setting: 4 Feeling/ Response:  both Comments:  Patient tolerated well, this was patient's first treatment. Video was watched and consent form was obtained. Patient was given voiding dairy and questions were answered  Preformed By: Fonnie Jarvis, CMA   Follow Up: 1 week

## 2019-09-15 ENCOUNTER — Other Ambulatory Visit: Payer: Self-pay | Admitting: Primary Care

## 2019-09-22 ENCOUNTER — Other Ambulatory Visit: Payer: Self-pay

## 2019-09-22 ENCOUNTER — Ambulatory Visit (INDEPENDENT_AMBULATORY_CARE_PROVIDER_SITE_OTHER): Payer: Medicare Other | Admitting: *Deleted

## 2019-09-22 DIAGNOSIS — R3915 Urgency of urination: Secondary | ICD-10-CM | POA: Diagnosis not present

## 2019-09-22 NOTE — Progress Notes (Signed)
PTNS  Session # 2  Health & Social Factors: no change Caffeine: 0 Alcohol: 0 Daytime voids #per day: 8 Night-time voids #per night: 4 Urgency: mild Incontinence Episodes #per day: 0-1 week Ankle used: right 6 Treatment Setting:  Feeling/ Response:  both Comments:  Patient tolerated well,   Preformed By: Gaspar Cola CMA    Follow Up: 1 week

## 2019-09-28 ENCOUNTER — Other Ambulatory Visit: Payer: Self-pay

## 2019-09-28 ENCOUNTER — Ambulatory Visit (INDEPENDENT_AMBULATORY_CARE_PROVIDER_SITE_OTHER): Payer: Medicare Other

## 2019-09-28 ENCOUNTER — Other Ambulatory Visit: Payer: Self-pay | Admitting: Primary Care

## 2019-09-28 DIAGNOSIS — N3281 Overactive bladder: Secondary | ICD-10-CM

## 2019-09-28 NOTE — Patient Instructions (Signed)
Tracking Your Bladder Symptoms    Patient Name:___________________________________________________   Sample: Day   Daytime Voids  Nighttime Voids Urgency for the Day(0-4) Number of Accidents Beverage Comments  Monday IIII II 2 I Water IIII Coffee  I      Week Starting:____________________________________   Day Daytime  Voids Nighttime  Voids Urgency for  The Day(0-4) Number of Accidents Beverages Comments                                                           This week my symptoms were:  O much better  O better O the same O worse   

## 2019-09-28 NOTE — Progress Notes (Signed)
PTNS  Session #3  Health & Social Factors:no change Caffeine:1 Alcohol:0 Daytime voids #per day:7-8 Night-time voids #per night:3-4 Urgency:mild Incontinence Episodes #per day:0-1 week Ankle used:left Treatment Setting:9 Feeling/ Response:both Comments:Patient tolerated well,   Preformed By: Kerman Passey, RMA   Follow Up:1 week

## 2019-09-28 NOTE — Progress Notes (Signed)
Tracking Your Bladder Symptoms    Patient Name:___________________________________________________   Sample: Day   Daytime Voids  Nighttime Voids Urgency for the Day(0-4) Number of Accidents Beverage Comments  Monday IIII II 2 I Water IIII Coffee  I      Week Starting:____________________________________   Day Daytime  Voids Nighttime  Voids Urgency for  The Day(0-4) Number of Accidents Beverages Comments                                                           This week my symptoms were:  O much better  O better O the same O worse   

## 2019-10-05 ENCOUNTER — Ambulatory Visit (INDEPENDENT_AMBULATORY_CARE_PROVIDER_SITE_OTHER): Payer: Medicare Other | Admitting: Family Medicine

## 2019-10-05 ENCOUNTER — Other Ambulatory Visit: Payer: Self-pay

## 2019-10-05 DIAGNOSIS — N3281 Overactive bladder: Secondary | ICD-10-CM

## 2019-10-05 NOTE — Progress Notes (Signed)
PTNS  Session #4  Health & Social Factors:no change Caffeine:1 Alcohol:0 Daytime voids #per day:7-8 Night-time voids #per night:3-4 Urgency:mild Incontinence Episodes #per day:0-1 week Ankle used:right Treatment Setting:4 Feeling/ Response:both Comments:Patient tolerated well,   Preformed By: Elberta Leatherwood CMA    Follow Up:1 week

## 2019-10-07 ENCOUNTER — Encounter: Payer: Self-pay | Admitting: Urology

## 2019-10-07 ENCOUNTER — Other Ambulatory Visit: Payer: Self-pay

## 2019-10-07 ENCOUNTER — Ambulatory Visit (INDEPENDENT_AMBULATORY_CARE_PROVIDER_SITE_OTHER): Payer: Medicare Other | Admitting: Urology

## 2019-10-07 ENCOUNTER — Other Ambulatory Visit: Payer: Self-pay | Admitting: Urology

## 2019-10-07 ENCOUNTER — Telehealth: Payer: Self-pay | Admitting: Urology

## 2019-10-07 VITALS — BP 176/81 | HR 73 | Ht 64.0 in | Wt 149.0 lb

## 2019-10-07 DIAGNOSIS — N390 Urinary tract infection, site not specified: Secondary | ICD-10-CM | POA: Diagnosis not present

## 2019-10-07 DIAGNOSIS — N952 Postmenopausal atrophic vaginitis: Secondary | ICD-10-CM

## 2019-10-07 DIAGNOSIS — N3946 Mixed incontinence: Secondary | ICD-10-CM

## 2019-10-07 MED ORDER — CEPHALEXIN 500 MG PO CAPS: 500.0000 mg | ORAL_CAPSULE | Freq: Two times a day (BID) | ORAL | 0 refills | Status: DC

## 2019-10-07 NOTE — Telephone Encounter (Signed)
Patient feels that she may have a UTI.  She was experiencing burning, pressure, and frequency.  She started taking AZO yesterday (took 4 tablets).    Patient is wanting to know if she can bring over a urine specimen or if she needs to be seen.  (She is a Dr. Junious Silk patient.)

## 2019-10-07 NOTE — Progress Notes (Signed)
10/07/2019 9:52 AM   [name] 04-Feb-1934 [id number]  Referring provider: Pleas Koch, NP [address],  [place] [address]  Chief Complaint  Patient presents with  . Urinary Tract Infection    HPI: Ashlee Lopez is an 86 y.o. female with rUTI's and incontinence who presents today for possible UTI.  rUTI's Risk factors: age, post menopausal and IBS Positive for Klebsiella pneumoniae resistant to ampicillin and cefuroxime on November 19, 2018 Positive for Klebsiella resistant to ampicillin and nitrofurantoin on December 04, 2018 Positive for E. coli resistant to ampicillin ciprofloxacin tetracycline and trimethoprim/sulfa on January 08, 2019 Positive for E. coli resistant to ampicillin, ciprofloxacin, levofloxacin, tetracycline and trimethoprim/sulfa on Jul 31, 2019 Positive for pansensitive E. coli on September 04, 2019  RUS 03/2018 Medical renal disease changes of both kidneys.  Two LEFT renal cysts.  Otherwise negative exam.  Today, she is experiencing frequency, urgency, dysuria, incontinence, weak urinary stream and lower abdominal pain.  This has been occurring for the last twp days.  She has taken four AZO's tablets.  Patient denies any modifying or aggravating factors.  Patient denies any gross hematuria or flank pain.  Patient denies any fevers, chills, nausea or vomiting.  CATH UA nitrite positive (although likely due to taking AZO), > 30 WBC's, 3-10 RBC's and many bacteria.  PVR 50 mL.    Incontinence Completed PTNS treatment #4 last week and is scheduled for PTNS treatment #5 on Monday.  Vaginal atrophy Continue vaginal estrogen cream Monday night, Wednesday night and Friday night   PMH: Past Medical History:  Diagnosis Date  . Benign essential HTN 03/21/2011  . Breast cancer, stage 1 ([facility]) 02/10/2003   Right tubular breast cancer  . Cancer ([facility])   . Diverticulosis of colon 03/23/2011  . Family history of breast cancer   . Family history of colon  cancer   . Family history of melanoma   . Family history of prostate cancer   . Fibrocystic disease of breast 03/23/2011  . Graves' disease with exophthalmos 03/23/2011  . Hypertension   . Hypothyroid 03/21/2011  . IBS (irritable bowel syndrome) 03/23/2011  . ITP (idiopathic thrombocytopenic purpura) 03/21/2011  . S/P splenectomy 03/21/2011  . Uterus cancer ([facility]) 03/23/1999   Well differentiated AdenoCA of endometrium-superficially confined  . Varicose vein of leg 03/23/2011    Surgical History: Past Surgical History:  Procedure Laterality Date  . ABDOMINAL HYSTERECTOMY  2001  . APPENDECTOMY  1941  . BREAST SURGERY  02/17/2003   Mastectomy-Right  . MASTECTOMY  2004   Dr Margot Chimes  . OOPHORECTOMY     BSO  . SKIN CANCER EXCISION  2019   removal of cancer from ear  . SPLENECTOMY  1986    Home Medications:  Allergies as of 10/07/2019      Reactions   Sulfamethoxazole-trimethoprim Hives   Septra [bactrim]       Medication List       Accurate as of October 07, 2019 11:59 PM. If you have any questions, ask your nurse or doctor.        amLODipine 10 MG tablet Commonly known as: NORVASC TAKE 1 TABLET BY MOUTH DAILY FOR BLOOD PRESSURE   aspirin 81 MG tablet Take 81 mg by mouth daily.   Calcium Carbonate-Vitamin D 600-200 MG-UNIT Tabs Take by mouth.   CALCIUM-VITAMIN D PO Take by mouth.   cephALEXin 500 MG capsule Commonly known as: KEFLEX Take 1 capsule (500 mg total) by  mouth 2 (two) times daily. Started by: Zara Council, PA-C   CRANBERRY PO Take 3 tablets by mouth daily.   doxycycline 100 MG capsule Commonly known as: VIBRAMYCIN Take 100 mg by mouth 2 (two) times daily.   hydrochlorothiazide 25 MG tablet Commonly known as: HYDRODIURIL TAKE 1 TABLET BY MOUTH ONCE DAILY FOR BLOOD PRESSURE   levothyroxine 100 MCG tablet Commonly known as: SYNTHROID TAKE 1 TAB BY MOUTH ONCE DAILY. TAKE ON AN EMPTY STOMACH WITH A GLASS OF WATER ATLEAST 30-60 MINUTES BEFORE BREAKFAST     losartan 50 MG tablet Commonly known as: COZAAR Take 1 tablet (50 mg total) by mouth daily. For blood pressure.   metoprolol tartrate 50 MG tablet Commonly known as: LOPRESSOR TAKE 1 TABLET BY MOUTH TWICE A DAY   nitrofurantoin (macrocrystal-monohydrate) 100 MG capsule Commonly known as: MACROBID Take 1 capsule (100 mg total) by mouth every 12 (twelve) hours.   omeprazole 20 MG capsule Commonly known as: PRILOSEC TAKE 1 CAPSULE BY MOUTH ONCE DAILY FOR HEARTBURN   Premarin vaginal cream Generic drug: conjugated estrogens Apply 0.43m (pea-sized amount)  just inside the vaginal introitus with a finger-tip on  Monday, Wednesday and Friday nights.   rosuvastatin 5 MG tablet Commonly known as: CRESTOR TAKE 1 TABLET BY MOUTH EVERY OTHER DAY   solifenacin 5 MG tablet Commonly known as: VESICARE Take 1 tablet (5 mg total) by mouth daily.   TYLENOL PM EXTRA STRENGTH PO Take by mouth.       Allergies:  Allergies  Allergen Reactions  . Sulfamethoxazole-Trimethoprim Hives  . Septra [Bactrim]     Family History: Family History  Problem Relation Age of Onset  . Breast cancer Mother        Age 84 . Hypertension Father   . Heart disease Father   . Lung cancer Father   . Breast cancer Sister        Age 84 . Melanoma Sister        dx in her 682s . Dementia Sister   . Heart disease Maternal Aunt   . Prostate cancer Maternal Uncle   . Colon cancer Maternal Grandfather   . Colon cancer Maternal Uncle   . Prostate cancer Maternal Uncle   . Dementia Maternal Aunt   . Breast cancer Cousin        maternal 2nd cousin  . Breast cancer Cousin        maternal first cousin dx in her 529s . Melanoma Son        dx in his 450s   Social History:  reports that she has never smoked. She has never used smokeless tobacco. She reports that she does not drink alcohol and does not use drugs.  ROS: Pertinent ROS in HPI  Physical Exam: BP (!) 176/81 (BP Location: Left Arm, Patient  Position: Sitting, Cuff Size: Normal)   Pulse 73   Ht '5\' 4"'  (1.626 m)   Wt 149 lb (67.6 kg)   BMI 25.58 kg/m   Constitutional:  Well nourished. Alert and oriented, No acute distress. HEENT: [place] AT, mask in place.  Trachea midline. Cardiovascular: No clubbing, cyanosis, or edema. Respiratory: Normal respiratory effort, no increased work of breathing. GU: No CVA tenderness.  No bladder fullness or masses.  Atrophic external genitalia, stained pubic hair with AZO, no lesions.  Normal urethral meatus, no lesions, no prolapse, no discharge.   No urethral masses, tenderness and/or tenderness. No bladder fullness, tenderness or masses. Pale  vagina mucosa, poor estrogen effect, no discharge, no lesions  Anus and perineum are without rashes or lesions.    Neurologic: Grossly intact, no focal deficits, moving all 4 extremities. Psychiatric: Normal mood and affect.   Laboratory Data: Lab Results  Component Value Date   WBC 5.3 12/10/2018   HGB 12.5 12/10/2018   HCT 37.2 12/10/2018   MCV 95 12/10/2018   PLT 285 12/10/2018    Lab Results  Component Value Date   CREATININE 0.74 12/10/2018    Lab Results  Component Value Date   HGBA1C 5.7 (A) 12/17/2018    Lab Results  Component Value Date   TSH 4.460 12/10/2018       Component Value Date/Time   CHOL 157 12/10/2018 0906   HDL 64 12/10/2018 0906   CHOLHDL 2.5 12/10/2018 0906   LDLCALC 78 12/10/2018 0906    Lab Results  Component Value Date   AST 29 12/10/2018   Lab Results  Component Value Date   ALT 18 12/10/2018    Urinalysis Component     Latest Ref Rng & Units 10/07/2019  Specific Gravity, UA     1.005 - 1.030 1.015  pH, UA     5.0 - 7.5 7.5  Color, UA     Yellow Orange  Appearance Ur     Clear Cloudy (A)  Leukocytes,UA     Negative 3+ (A)  Protein,UA     Negative/Trace 1+ (A)  Glucose, UA     Negative Trace (A)  Ketones, UA     Negative Trace (A)  RBC, UA     Negative Trace (A)  Bilirubin, UA      Negative Negative  Urobilinogen, Ur     0.2 - 1.0 mg/dL 1.0  Nitrite, UA     Negative Positive (A)  Microscopic Examination      See below:   Component     Latest Ref Rng & Units 10/07/2019  WBC, UA     0 - 5 /hpf >30 (A)  RBC     0 - 2 /hpf 3-10 (A)  Epithelial Cells (non renal)     0 - 10 /hpf 0-10  Bacteria, UA     None seen/Few Many (A)  I have reviewed the labs.   Pertinent Imaging: CLINICAL DATA:  Recurrent UTI  EXAM: RENAL / URINARY TRACT ULTRASOUND COMPLETE  COMPARISON:  None  FINDINGS: Right Kidney:  Renal measurements: 9.7 x 3.5 x 5.0 cm = volume: 87.4 mL. Normal cortical thickness. Increased cortical echogenicity. No mass, hydronephrosis or shadowing calcification.  Left Kidney:  Renal measurements: 10.4 x 5.6 x 4.6 cm = volume: 132.4 mL. Normal cortical thickness. Increased cortical echogenicity. Simple appearing cyst at inferior pole 2.9 x 3.2 x 3.1 cm. Tiny cyst at medial aspect of mid upper pole 1.5 x 0.9 x 1.0 cm. No hydronephrosis or shadowing calcification.  Bladder:  Appears normal for degree of bladder distention. BILATERAL ureteral jets seen.  IMPRESSION: Medical renal disease changes of both kidneys.  Two LEFT renal cysts.  Otherwise negative exam.   Electronically Signed   By: Lavonia Dana M.D.   On: 03/28/2018 15:06  In and Out Catheterization Patient is present today for a I & O catheterization due to rUTI's. Patient was cleaned and prepped in a sterile fashion with betadine . A 14 FR cath was inserted no complications were noted , 50 ml of urine return was noted, urine was orange clear in color. A clean urine sample  was collected for UA and urine culture. Bladder was drained  And catheter was removed with out difficulty.    Performed by: Zara Council, PA-C    Assessment & Plan:    1. rUTI's - criteria for recurrent UTI has been met with 2 or more infections in 6 months or 3 or greater infections in one  year  - patient is instructed to increase their water intake until the urine is pale yellow or clear (10 to 12 cups daily)  - patient is instructed to take probiotics (yogurt, oral pills or vaginal suppositories), take cranberry pills or drink the juice and Vitamin C 1,000 mg daily to acidify the urine - explained to her to have CATH UA's for urinalysis and culture to prevent skin, vaginal and/or rectal contamination of the specimen - reviewed symptoms of UTI (fevers, chills, gross hematuria, mental status changes, dysuria, suprapubic pain, back pain and/or sudden worsening of urinary symptoms) and advised not to have urine checked or be treated for UTI if not experiencing symptoms - discussed antibiotic stewardship with the patient - explained the risk of increasing risk of antibiotic resistance with continuous exposure to antibiotics, renal failure, hypoglycemia, C. Diff infection, allergic reactions, etc.   UA grossly infected - sent for culture Keflex started empirically, will adjust if necessary once culture and sensitivities are available  2. Incontinence Continue the PTNS treatments  3. Vaginal atrophy I explained to the patient that when women go through menopause and her estrogen levels are severely diminished, the normal vaginal flora will change.  This is due to an increase of the vaginal canal's pH. Because of this, the vaginal canal may be colonized by bacteria from the rectum instead of the protective lactobacillus.  This, accompanied by the loss of the mucus barrier with vaginal atrophy, is a cause of recurrent urinary tract infections. In some studies, the use of vaginal estrogen cream has been demonstrated to reduce  recurrent urinary tract infections to one a year.  Patient will continue the vaginal estrogen cream, applying it 3 nights weekly.                                             Return for pending urine culture results .  These notes generated with voice recognition  software. I apologize for typographical errors.  Zara Council, PA-C  [facility] [address]  [place], [place] [address] [phone]

## 2019-10-07 NOTE — Telephone Encounter (Signed)
Contacted patient to discuss symptoms. Scheduled appointment for evaluation.

## 2019-10-08 LAB — URINALYSIS, COMPLETE
Bilirubin, UA: NEGATIVE
Nitrite, UA: POSITIVE — AB
Specific Gravity, UA: 1.015 (ref 1.005–1.030)
Urobilinogen, Ur: 1 mg/dL (ref 0.2–1.0)
pH, UA: 7.5 (ref 5.0–7.5)

## 2019-10-08 LAB — MICROSCOPIC EXAMINATION: WBC, UA: >30 /hpf — AB (ref 0–5)

## 2019-10-08 MED ORDER — PREMARIN 0.625 MG/GM VA CREA: TOPICAL_CREAM | VAGINAL | 12 refills | Status: DC

## 2019-10-09 ENCOUNTER — Telehealth: Payer: Self-pay

## 2019-10-09 NOTE — Telephone Encounter (Signed)
Notified patient that her culture results were not back yet and assured her we would call her when we got the results.

## 2019-10-11 LAB — CULTURE, URINE COMPREHENSIVE

## 2019-10-12 ENCOUNTER — Ambulatory Visit (INDEPENDENT_AMBULATORY_CARE_PROVIDER_SITE_OTHER): Payer: Medicare Other

## 2019-10-12 ENCOUNTER — Telehealth: Payer: Self-pay | Admitting: Family Medicine

## 2019-10-12 ENCOUNTER — Other Ambulatory Visit: Payer: Self-pay

## 2019-10-12 DIAGNOSIS — N3946 Mixed incontinence: Secondary | ICD-10-CM

## 2019-10-12 DIAGNOSIS — N3281 Overactive bladder: Secondary | ICD-10-CM

## 2019-10-12 NOTE — Telephone Encounter (Signed)
-----   Message from Nori Riis, PA-C sent at 10/11/2019  8:17 PM EDT ----- Please let Ashlee Lopez know that her urine culture was positive for infection and to finish the Keflex as prescribed.  If she should have another UTI after this current one is treated, she needs to have a cystoscopy.

## 2019-10-12 NOTE — Progress Notes (Signed)
PTNS  Session # 5  Health & Social Factors: Recent UTI  Caffeine: 1 Alcohol: 0 Daytime voids #per day: 7 Night-time voids #per night: 4 Urgency: Mild Incontinence Episodes #per day: 1 Ankle used: Left Treatment Setting: 6 Feeling/ Response: Sensory & Toe Flex  Comments:N/A  Preformed By: Gordy Clement, CMA   Follow Up: RTC in 1 week

## 2019-10-12 NOTE — Patient Instructions (Signed)
Tracking Your Bladder Symptoms    Patient Name:___________________________________________________   Sample: Day   Daytime Voids  Nighttime Voids Urgency for the Day(0-4) Number of Accidents Beverage Comments  Monday IIII II 2 I Water IIII Coffee  I      Week Starting:____________________________________   Day Daytime  Voids Nighttime  Voids Urgency for  The Day(0-4) Number of Accidents Beverages Comments                                                           This week my symptoms were:  O much better  O better O the same O worse   

## 2019-10-12 NOTE — Telephone Encounter (Signed)
Patient notified and voiced understanding.

## 2019-10-13 ENCOUNTER — Telehealth: Payer: Self-pay

## 2019-10-13 NOTE — Telephone Encounter (Signed)
Pt calls and states that she has changed insurances recently and now Premarin is too expensive for her at $400. Pt questions if there are any alternatives she can use. Please advise.

## 2019-10-13 NOTE — Telephone Encounter (Signed)
[facility] can [facility] for compounded estrogen cream.  Would she like to do that?  Judson Roch knows the details.

## 2019-10-14 NOTE — Telephone Encounter (Signed)
call [phone] option 3 referance the case number [id number]

## 2019-10-14 NOTE — Telephone Encounter (Signed)
Created in error

## 2019-10-15 ENCOUNTER — Telehealth: Payer: Self-pay | Admitting: Family Medicine

## 2019-10-15 NOTE — Telephone Encounter (Signed)
Patient notified Premarin is approved through 03/18/2020.

## 2019-10-19 ENCOUNTER — Ambulatory Visit (INDEPENDENT_AMBULATORY_CARE_PROVIDER_SITE_OTHER): Payer: Medicare Other | Admitting: Family Medicine

## 2019-10-19 ENCOUNTER — Other Ambulatory Visit: Payer: Self-pay

## 2019-10-19 DIAGNOSIS — N3281 Overactive bladder: Secondary | ICD-10-CM

## 2019-10-19 NOTE — Patient Instructions (Signed)
Tracking Your Bladder Symptoms    Patient Name:___________________________________________________   Sample: Day   Daytime Voids  Nighttime Voids Urgency for the Day(0-4) Number of Accidents Beverage Comments  Monday IIII II 2 I Water IIII Coffee  I      Week Starting:____________________________________   Day Daytime  Voids Nighttime  Voids Urgency for  The Day(0-4) Number of Accidents Beverages Comments                                                           This week my symptoms were:  O much better  O better O the same O worse   

## 2019-10-19 NOTE — Progress Notes (Signed)
PTNS  Session # 6  Health & Social Factors: no change Caffeine: 1 Alcohol: 0 Daytime voids #per day: 6-7 Night-time voids #per night: 4-5 Urgency: mild Incontinence Episodes #per day: 0 Ankle used: right Treatment Setting: 6 Feeling/ Response: sensory Comments: Patient tolerated well  Preformed By: Elberta Leatherwood, CMA  Follow Up: 1 week

## 2019-10-26 ENCOUNTER — Other Ambulatory Visit: Payer: Self-pay

## 2019-10-26 ENCOUNTER — Ambulatory Visit (INDEPENDENT_AMBULATORY_CARE_PROVIDER_SITE_OTHER): Payer: Medicare Other | Admitting: Physician Assistant

## 2019-10-26 DIAGNOSIS — N3946 Mixed incontinence: Secondary | ICD-10-CM | POA: Diagnosis not present

## 2019-10-26 NOTE — Progress Notes (Signed)
PTNS  Session #7  Health & Social Factors:No Change Caffeine: 1 Alcohol: 0 Daytime voids #per day: 6 Night-time voids #per night: 3-4 Urgency: Mild Incontinence Episodes #per day: 0-1 Ankle used: Left Treatment Setting: 4 Feeling/ Response: Sensory Comments: N/A  Performed By: Gordy Clement, CMA   Follow Up: RTC in 1 week

## 2019-11-02 ENCOUNTER — Ambulatory Visit (INDEPENDENT_AMBULATORY_CARE_PROVIDER_SITE_OTHER): Payer: Medicare Other

## 2019-11-02 ENCOUNTER — Other Ambulatory Visit: Payer: Self-pay

## 2019-11-02 DIAGNOSIS — N3281 Overactive bladder: Secondary | ICD-10-CM

## 2019-11-02 NOTE — Patient Instructions (Signed)
Tracking Your Bladder Symptoms    Patient Name:___________________________________________________   Sample: Day   Daytime Voids  Nighttime Voids Urgency for the Day(0-4) Number of Accidents Beverage Comments  Monday IIII II 2 I Water IIII Coffee  I      Week Starting:____________________________________   Day Daytime  Voids Nighttime  Voids Urgency for  The Day(0-4) Number of Accidents Beverages Comments                                                           This week my symptoms were:  O much better  O better O the same O worse   

## 2019-11-02 NOTE — Progress Notes (Signed)
PTNS  Session # 8  Health & Social Factors: no change Caffeine: 1 Alcohol: 0 Daytime voids #per day: 7 Night-time voids #per night: 4 Urgency: mild Incontinence Episodes #per day: 0-1 Ankle used: right Treatment Setting: 13 Feeling/ Response: both Comments: patient tolerated well  Performed By: Fonnie Jarvis, CMA   Follow Up: 1week

## 2019-11-04 ENCOUNTER — Other Ambulatory Visit: Payer: Self-pay | Admitting: Primary Care

## 2019-11-04 DIAGNOSIS — K219 Gastro-esophageal reflux disease without esophagitis: Secondary | ICD-10-CM

## 2019-11-09 ENCOUNTER — Ambulatory Visit (INDEPENDENT_AMBULATORY_CARE_PROVIDER_SITE_OTHER): Payer: Medicare Other

## 2019-11-09 ENCOUNTER — Other Ambulatory Visit: Payer: Self-pay

## 2019-11-09 DIAGNOSIS — N3946 Mixed incontinence: Secondary | ICD-10-CM | POA: Diagnosis not present

## 2019-11-09 DIAGNOSIS — Z87448 Personal history of other diseases of urinary system: Secondary | ICD-10-CM

## 2019-11-09 DIAGNOSIS — N3281 Overactive bladder: Secondary | ICD-10-CM

## 2019-11-09 NOTE — Progress Notes (Signed)
PTNS  Session # 9  Health & Social Factors: No Change Caffeine: 1 Alcohol: 0 Daytime voids #per day: 6 Night-time voids #per night: 4 Urgency: Mild Incontinence Episodes #per day: 1 Ankle used: Left Treatment Setting: 12 Feeling/ Response: Sensory & Toe Flex Comments: N/A  Performed By: Gordy Clement, CMA   Follow Up: RTC in 1 week

## 2019-11-11 ENCOUNTER — Ambulatory Visit (INDEPENDENT_AMBULATORY_CARE_PROVIDER_SITE_OTHER): Payer: Medicare Other | Admitting: Podiatry

## 2019-11-11 ENCOUNTER — Encounter: Payer: Self-pay | Admitting: Podiatry

## 2019-11-11 ENCOUNTER — Other Ambulatory Visit: Payer: Self-pay

## 2019-11-11 DIAGNOSIS — B351 Tinea unguium: Secondary | ICD-10-CM

## 2019-11-11 DIAGNOSIS — M79676 Pain in unspecified toe(s): Secondary | ICD-10-CM

## 2019-11-11 LAB — URINALYSIS, COMPLETE
Bilirubin, UA: NEGATIVE
Glucose, UA: NEGATIVE
Ketones, UA: NEGATIVE
Nitrite, UA: NEGATIVE
Protein,UA: NEGATIVE
RBC, UA: NEGATIVE
Specific Gravity, UA: 1.02 (ref 1.005–1.030)
Urobilinogen, Ur: 0.2 mg/dL (ref 0.2–1.0)
pH, UA: 7.5 (ref 5.0–7.5)

## 2019-11-11 LAB — MICROSCOPIC EXAMINATION: Epithelial Cells (non renal): >10 /hpf — AB (ref 0–10)

## 2019-11-11 NOTE — Progress Notes (Signed)
She denies fever chills nausea vomiting muscle aches pains states that she like to have her toenails trimmed.  Her nails are getting long and painful.  Objective: Vital signs are stable she is alert oriented x3 toenails are long thick yellow dystrophic Lee mycotic pulses remain palpable no open lesions or wounds are noted.  Assessment: Pain in limb secondary onychomycosis.  Plan: Debridement of toenails 1 through 5 bilateral.

## 2019-11-16 ENCOUNTER — Other Ambulatory Visit: Payer: Self-pay

## 2019-11-16 ENCOUNTER — Ambulatory Visit (INDEPENDENT_AMBULATORY_CARE_PROVIDER_SITE_OTHER): Payer: Medicare Other | Admitting: Family Medicine

## 2019-11-16 DIAGNOSIS — N3281 Overactive bladder: Secondary | ICD-10-CM | POA: Diagnosis not present

## 2019-11-16 NOTE — Progress Notes (Signed)
PTNS  Session # 10  Health & Social Factors: no change Caffeine: 1 Alcohol: 0 Daytime voids #per day: 8 Night-time voids #per night: 4 Urgency: srong Incontinence Episodes #per day: 1 Ankle used: right Treatment Setting: 7 Feeling/ Response: sensory Comments: patient tolerated well  Performed By: Elberta Leatherwood, CMA  Follow Up: 1 week #11

## 2019-11-24 ENCOUNTER — Other Ambulatory Visit: Payer: Self-pay

## 2019-11-24 ENCOUNTER — Ambulatory Visit (INDEPENDENT_AMBULATORY_CARE_PROVIDER_SITE_OTHER): Payer: Medicare Other | Admitting: Family Medicine

## 2019-11-24 DIAGNOSIS — N3281 Overactive bladder: Secondary | ICD-10-CM

## 2019-11-24 NOTE — Progress Notes (Signed)
PTNS  Session # 11  Health & Social Factors: no change Caffeine: 1 Alcohol: 0 Daytime voids #per day: 7 Night-time voids #per night: 4 Urgency: strong Incontinence Episodes #per day: 1 Ankle used: left Treatment Setting: 9 Feeling/ Response: sensory Comments: Patient tolerated well  Performed By: Elberta Leatherwood, CMA  Follow Up: 1 week #12

## 2019-11-26 ENCOUNTER — Other Ambulatory Visit: Payer: Self-pay

## 2019-11-26 ENCOUNTER — Ambulatory Visit (INDEPENDENT_AMBULATORY_CARE_PROVIDER_SITE_OTHER): Payer: Medicare Other | Admitting: Physician Assistant

## 2019-11-26 ENCOUNTER — Encounter: Payer: Self-pay | Admitting: Physician Assistant

## 2019-11-26 VITALS — BP 165/86 | HR 78 | Ht 64.0 in | Wt 149.0 lb

## 2019-11-26 DIAGNOSIS — N390 Urinary tract infection, site not specified: Secondary | ICD-10-CM

## 2019-11-26 MED ORDER — CEFUROXIME AXETIL 250 MG PO TABS: 250.0000 mg | ORAL_TABLET | Freq: Two times a day (BID) | ORAL | 0 refills | Status: AC

## 2019-11-26 MED ORDER — NITROFURANTOIN MONOHYD MACRO 100 MG PO CAPS: 100.0000 mg | ORAL_CAPSULE | Freq: Every day | ORAL | 5 refills | Status: AC

## 2019-11-26 NOTE — Progress Notes (Signed)
In and Out Catheterization  Patient is present today for a I & O catheterization due to possible UTI. Patient was cleaned and prepped in a sterile fashion with betadine . A 14FR cath was inserted no complications were noted , 166ml of urine return was noted, urine was yellow in color. A clean urine sample was collected for UA. Bladder was drained  And catheter was removed with out difficulty.    Performed by: Bradly Bienenstock, CMA

## 2019-11-26 NOTE — Progress Notes (Signed)
11/26/2019 2:32 PM   Ashlee Lopez 30-Jan-1934 [id number]  CC: Chief Complaint  Patient presents with  . Urinary Tract Infection    HPI: Ashlee Lopez is a 86 y.o. female with PMH recurrent UTI on topical vaginal estrogen cream and cranberry supplements and incontinence currently undergoing PTNS who presents today for evaluation of possible UTI. She is an established [facility] patient who last saw Ashlee Lopez on 10/07/2019 for the same. Catheterized urine sample grew pansensitive E coli; she was treated with Keflex 500mg  BID x7 days.  Today she reports a 4-day history of dysuria, urgency, frequency, and nocturia.  She denies fever, chills, nausea, vomiting, and gross hematuria.  She has not taken any medication at home for treatment of her symptoms.  She continues to take topical vaginal estrogen cream 3 times weekly, starting approximately 1 year ago, and daily cranberry supplements.  She is not on probiotics.  She has had 3+ urine cultures in the past 4 months, not including today.  In-office catheterized UA today positive for trace-intact blood and 3+ leukocyte esterase; urine microscopy with 11-30 WBCs/HPF and many bacteria.   PMH: Past Medical History:  Diagnosis Date  . Benign essential HTN 03/21/2011  . Breast cancer, stage 1 ([place]) 02/10/2003   Right tubular breast cancer  . Cancer ([place])   . Diverticulosis of colon 03/23/2011  . Family history of breast cancer   . Family history of colon cancer   . Family history of melanoma   . Family history of prostate cancer   . Fibrocystic disease of breast 03/23/2011  . Graves' disease with exophthalmos 03/23/2011  . Hypertension   . Hypothyroid 03/21/2011  . IBS (irritable bowel syndrome) 03/23/2011  . ITP (idiopathic thrombocytopenic purpura) 03/21/2011  . S/P splenectomy 03/21/2011  . Uterus cancer ([place]) 03/23/1999   Well differentiated AdenoCA of endometrium-superficially confined  . Varicose vein of leg 03/23/2011    Surgical History: Past  Surgical History:  Procedure Laterality Date  . ABDOMINAL HYSTERECTOMY  2001  . APPENDECTOMY  1941  . BREAST SURGERY  02/17/2003   Mastectomy-Right  . MASTECTOMY  2004   Dr Margot Chimes  . OOPHORECTOMY     BSO  . SKIN CANCER EXCISION  2019   removal of cancer from ear  . SPLENECTOMY  1986    Home Medications:  Allergies as of 11/26/2019      Reactions   Sulfamethoxazole-trimethoprim Hives   Septra [bactrim]       Medication List       Accurate as of November 26, 2019  2:32 PM. If you have any questions, ask your nurse or doctor.        amLODipine 10 MG tablet Commonly known as: NORVASC TAKE 1 TABLET BY MOUTH DAILY FOR BLOOD PRESSURE   aspirin 81 MG tablet Take 81 mg by mouth daily.   Calcium Carbonate-Vitamin D 600-200 MG-UNIT Tabs Take by mouth.   CALCIUM-VITAMIN D PO Take by mouth.   cefUROXime 250 MG tablet Commonly known as: CEFTIN Take 1 tablet (250 mg total) by mouth 2 (two) times daily with a meal for 5 days. Started by: Debroah Loop, PA-C   cephALEXin 500 MG capsule Commonly known as: KEFLEX Take 1 capsule (500 mg total) by mouth 2 (two) times daily.   CRANBERRY PO Take 3 tablets by mouth daily.   doxycycline 100 MG capsule Commonly known as: VIBRAMYCIN Take 100 mg by mouth 2 (two) times daily.   hydrochlorothiazide 25 MG tablet Commonly known as:  HYDRODIURIL TAKE 1 TABLET BY MOUTH ONCE DAILY FOR BLOOD PRESSURE   levothyroxine 100 MCG tablet Commonly known as: SYNTHROID TAKE 1 TAB BY MOUTH ONCE DAILY. TAKE ON AN EMPTY STOMACH WITH A GLASS OF WATER ATLEAST 30-60 MINUTES BEFORE BREAKFAST   losartan 50 MG tablet Commonly known as: COZAAR Take 1 tablet (50 mg total) by mouth daily. For blood pressure.   metoprolol tartrate 50 MG tablet Commonly known as: LOPRESSOR TAKE 1 TABLET BY MOUTH TWICE A DAY   nitrofurantoin (macrocrystal-monohydrate) 100 MG capsule Commonly known as: MACROBID Take 1 capsule (100 mg total) by mouth  daily. Started by: Debroah Loop, PA-C   omeprazole 20 MG capsule Commonly known as: PRILOSEC TAKE 1 CAPSULE BY MOUTH ONCE DAILY FOR HEARTBURN   Premarin vaginal cream Generic drug: conjugated estrogens Apply 0.5mg  (pea-sized amount)  just inside the vaginal introitus with a finger-tip on  Monday, Wednesday and Friday nights.   rosuvastatin 5 MG tablet Commonly known as: CRESTOR TAKE 1 TABLET BY MOUTH EVERY OTHER DAY   solifenacin 5 MG tablet Commonly known as: VESICARE Take 1 tablet (5 mg total) by mouth daily.   TYLENOL PM EXTRA STRENGTH PO Take by mouth.       Allergies:  Allergies  Allergen Reactions  . Sulfamethoxazole-Trimethoprim Hives  . Septra [Bactrim]     Family History: Family History  Problem Relation Age of Onset  . Breast cancer Mother        Age 69  . Hypertension Father   . Heart disease Father   . Lung cancer Father   . Breast cancer Sister        Age 48  . Melanoma Sister        dx in her 19s  . Dementia Sister   . Heart disease Maternal Aunt   . Prostate cancer Maternal Uncle   . Colon cancer Maternal Grandfather   . Colon cancer Maternal Uncle   . Prostate cancer Maternal Uncle   . Dementia Maternal Aunt   . Breast cancer Cousin        maternal 2nd cousin  . Breast cancer Cousin        maternal first cousin dx in her 42s  . Melanoma Son        dx in his 86s    Social History:   reports that she has never smoked. She has never used smokeless tobacco. She reports that she does not drink alcohol and does not use drugs.  Physical Exam: BP (!) 165/86 (BP Location: Left Arm, Patient Position: Sitting, Cuff Size: Normal)   Pulse 78   Ht 5\' 4"  (1.626 m)   Wt 149 lb (67.6 kg)   BMI 25.58 kg/m   Constitutional:  Alert and oriented, no acute distress, nontoxic appearing HEENT: [place], AT Cardiovascular: No clubbing, cyanosis, or edema Respiratory: Normal respiratory effort, no increased work of breathing Skin: No rashes, bruises  or suspicious lesions Neurologic: Grossly intact, no focal deficits, moving all 4 extremities Psychiatric: Normal mood and affect  Laboratory Data: Results for orders placed or performed in visit on 11/26/19  Microscopic Examination   Urine  Result Value Ref Range   WBC, UA 11-30 (A) 0 - 5 /hpf   RBC 0-2 0 - 2 /hpf   Epithelial Cells (non renal) 0-10 0 - 10 /hpf   Bacteria, UA Many (A) None seen/Few  Urinalysis, Complete  Result Value Ref Range   Specific Gravity, UA 1.015 1.005 - 1.030   pH, UA  7.0 5.0 - 7.5   Color, UA Yellow Yellow   Appearance Ur Cloudy (A) Clear   Leukocytes,UA 3+ (A) Negative   Protein,UA Negative Negative/Trace   Glucose, UA Negative Negative   Ketones, UA Negative Negative   RBC, UA Trace (A) Negative   Bilirubin, UA Negative Negative   Urobilinogen, Ur 0.2 0.2 - 1.0 mg/dL   Nitrite, UA Negative Negative   Microscopic Examination See below:    Assessment & Plan:   1. Recurrent UTI Urine notable for pyuria and bacteriuria today.  Will start empiric cefuroxime and send for culture for further evaluation.  This would represent her fourth UTI in 4 months.  She is already on topical vaginal estrogen cream and cranberry supplements.  I counseled her to start a daily probiotic containing lactobacillus and offered her daily suppressive antibiotics.  She is in agreement with this plan.  Macrobid 100 mg daily sent to her pharmacy.  I explained that 1-2 breakthrough infections on suppressive antibiotics annually is normal.  She expressed understanding. - Urinalysis, Complete - CULTURE, URINE COMPREHENSIVE - cefUROXime (CEFTIN) 250 MG tablet; Take 1 tablet (250 mg total) by mouth 2 (two) times daily with a meal for 5 days.  Dispense: 10 tablet; Refill: 0 - nitrofurantoin, macrocrystal-monohydrate, (MACROBID) 100 MG capsule; Take 1 capsule (100 mg total) by mouth daily.  Dispense: 30 capsule; Refill: 5   Return for as scheduled.  Debroah Loop,  PA-C  [facility] [address], [place], [place] [address] [phone]

## 2019-11-26 NOTE — Patient Instructions (Signed)
Continue topical vaginal estrogen cream 3 times weekly. Continue daily cranberry supplements. Start a daily probiotic containing lactobacillus. Start cefuroxime for treatment of your current UTI. The day after you finish this, you may start daily nitrofurantoin to prevent UTIs.

## 2019-11-27 LAB — URINALYSIS, COMPLETE
Bilirubin, UA: NEGATIVE
Glucose, UA: NEGATIVE
Ketones, UA: NEGATIVE
Nitrite, UA: NEGATIVE
Protein,UA: NEGATIVE
Specific Gravity, UA: 1.015 (ref 1.005–1.030)
Urobilinogen, Ur: 0.2 mg/dL (ref 0.2–1.0)
pH, UA: 7 (ref 5.0–7.5)

## 2019-11-27 LAB — MICROSCOPIC EXAMINATION

## 2019-11-30 ENCOUNTER — Ambulatory Visit (INDEPENDENT_AMBULATORY_CARE_PROVIDER_SITE_OTHER): Payer: Medicare Other | Admitting: Family Medicine

## 2019-11-30 ENCOUNTER — Other Ambulatory Visit: Payer: Self-pay

## 2019-11-30 DIAGNOSIS — N3281 Overactive bladder: Secondary | ICD-10-CM | POA: Diagnosis not present

## 2019-11-30 LAB — CULTURE, URINE COMPREHENSIVE

## 2019-11-30 NOTE — Progress Notes (Signed)
PTNS  Session # 12  Health & Social Factors: no change Caffeine: 1 Alcohol: 0 Daytime voids #per day: 8 Night-time voids #per night: 5 Urgency: strong Incontinence Episodes #per day: 0 Ankle used: right Treatment Setting: 9 Feeling/ Response: both Comments: patient tolerated well  Performed By: Elberta Leatherwood, CMA  Follow Up: 1 month

## 2019-12-09 ENCOUNTER — Other Ambulatory Visit: Payer: Self-pay | Admitting: Primary Care

## 2019-12-09 DIAGNOSIS — E039 Hypothyroidism, unspecified: Secondary | ICD-10-CM

## 2019-12-09 DIAGNOSIS — E785 Hyperlipidemia, unspecified: Secondary | ICD-10-CM

## 2019-12-09 DIAGNOSIS — R7303 Prediabetes: Secondary | ICD-10-CM

## 2019-12-09 DIAGNOSIS — I10 Essential (primary) hypertension: Secondary | ICD-10-CM

## 2019-12-11 ENCOUNTER — Other Ambulatory Visit: Payer: Self-pay | Admitting: Primary Care

## 2019-12-11 DIAGNOSIS — I10 Essential (primary) hypertension: Secondary | ICD-10-CM

## 2019-12-16 ENCOUNTER — Ambulatory Visit (INDEPENDENT_AMBULATORY_CARE_PROVIDER_SITE_OTHER): Payer: Medicare Other

## 2019-12-16 ENCOUNTER — Other Ambulatory Visit (INDEPENDENT_AMBULATORY_CARE_PROVIDER_SITE_OTHER): Payer: Medicare Other

## 2019-12-16 ENCOUNTER — Other Ambulatory Visit: Payer: Self-pay

## 2019-12-16 DIAGNOSIS — R7303 Prediabetes: Secondary | ICD-10-CM | POA: Diagnosis not present

## 2019-12-16 DIAGNOSIS — E785 Hyperlipidemia, unspecified: Secondary | ICD-10-CM | POA: Diagnosis not present

## 2019-12-16 DIAGNOSIS — E039 Hypothyroidism, unspecified: Secondary | ICD-10-CM

## 2019-12-16 DIAGNOSIS — Z0000 Encounter for general adult medical examination without abnormal findings: Secondary | ICD-10-CM | POA: Diagnosis not present

## 2019-12-16 NOTE — Progress Notes (Signed)
Subjective:   Ashlee Lopez is a 86 y.o. female who presents for Medicare Annual (Subsequent) preventive examination.  Review of Systems: N/A      I connected with the patient today by telephone and verified that I am speaking with the correct person using two identifiers. Location patient: home Location nurse: work Persons participating in the telephone visit: patient, nurse.   I discussed the limitations, risks, security and privacy concerns of performing an evaluation and management service by telephone and the availability of in person appointments. I also discussed with the patient that there may be a patient responsible charge related to this service. The patient expressed understanding and verbally consented to this telephonic visit.        Cardiac Risk Factors include: advanced age (>57men, >25 women);hypertension;Other (see comment), Risk factor comments: hyperlipidemia     Objective:    Today's Vitals   There is no height or weight on file to calculate BMI.  Advanced Directives 12/16/2019 12/04/2017 11/21/2016 09/28/2015  Does Patient Have a Medical Advance Directive? Yes Yes Yes Yes  Type of Paramedic of [place];Living will [place];Living will [place];Living will [place];Living will  Does patient want to make changes to medical advance directive? - No - Patient declined - No - Patient declined  Copy of [place] in Chart? No - copy requested No - copy requested No - copy requested No - copy requested    Current Medications (verified) Outpatient Encounter Medications as of 12/16/2019  Medication Sig  . amLODipine (NORVASC) 10 MG tablet TAKE 1 TABLET BY MOUTH DAILY FOR BLOOD PRESSURE  . aspirin 81 MG tablet Take 81 mg by mouth daily.    . Calcium Carbonate-Vitamin D 600-200 MG-UNIT TABS Take by mouth.  Marland Kitchen CALCIUM-VITAMIN D PO Take by mouth.    . conjugated estrogens  (PREMARIN) vaginal cream Apply 0.5mg  (pea-sized amount)  just inside the vaginal introitus with a finger-tip on  Monday, Wednesday and Friday nights.  . CRANBERRY PO Take 3 tablets by mouth daily.  . Diphenhydramine-APAP, sleep, (TYLENOL PM EXTRA STRENGTH PO) Take by mouth.    . doxycycline (VIBRAMYCIN) 100 MG capsule Take 100 mg by mouth 2 (two) times daily.  . hydrochlorothiazide (HYDRODIURIL) 25 MG tablet TAKE 1 TABLET BY MOUTH ONCE DAILY FOR BLOOD PRESSURE  . levothyroxine (SYNTHROID) 100 MCG tablet TAKE 1 TAB BY MOUTH ONCE DAILY. TAKE ON AN EMPTY STOMACH WITH A GLASS OF WATER ATLEAST 30-60 MINUTES BEFORE BREAKFAST  . losartan (COZAAR) 50 MG tablet Take 1 tablet (50 mg total) by mouth daily. For blood pressure.  . metoprolol tartrate (LOPRESSOR) 50 MG tablet TAKE 1 TABLET BY MOUTH TWICE A DAY  . nitrofurantoin, macrocrystal-monohydrate, (MACROBID) 100 MG capsule Take 1 capsule (100 mg total) by mouth daily.  Marland Kitchen omeprazole (PRILOSEC) 20 MG capsule TAKE 1 CAPSULE BY MOUTH ONCE DAILY FOR HEARTBURN  . rosuvastatin (CRESTOR) 5 MG tablet TAKE 1 TABLET BY MOUTH EVERY OTHER DAY  . solifenacin (VESICARE) 5 MG tablet Take 1 tablet (5 mg total) by mouth daily.  . cephALEXin (KEFLEX) 500 MG capsule Take 1 capsule (500 mg total) by mouth 2 (two) times daily. (Patient not taking: Reported on 12/16/2019)   No facility-administered encounter medications on file as of 12/16/2019.    Allergies (verified) Sulfamethoxazole-trimethoprim and Septra [bactrim]   History: Past Medical History:  Diagnosis Date  . Benign essential HTN 03/21/2011  . Breast cancer, stage  1 ([place]) 02/10/2003   Right tubular breast cancer  . Cancer ([place])   . Diverticulosis of colon 03/23/2011  . Family history of breast cancer   . Family history of colon cancer   . Family history of melanoma   . Family history of prostate cancer   . Fibrocystic disease of breast 03/23/2011  . Graves' disease with exophthalmos 03/23/2011  . Hypertension     . Hypothyroid 03/21/2011  . IBS (irritable bowel syndrome) 03/23/2011  . ITP (idiopathic thrombocytopenic purpura) 03/21/2011  . S/P splenectomy 03/21/2011  . Uterus cancer ([place]) 03/23/1999   Well differentiated AdenoCA of endometrium-superficially confined  . Varicose vein of leg 03/23/2011   Past Surgical History:  Procedure Laterality Date  . ABDOMINAL HYSTERECTOMY  2001  . APPENDECTOMY  1941  . BREAST SURGERY  02/17/2003   Mastectomy-Right  . MASTECTOMY  2004   Dr Margot Chimes  . OOPHORECTOMY     BSO  . SKIN CANCER EXCISION  2019   removal of cancer from ear  . SPLENECTOMY  1986   Family History  Problem Relation Age of Onset  . Breast cancer Mother        Age 69  . Hypertension Father   . Heart disease Father   . Lung cancer Father   . Breast cancer Sister        Age 69  . Melanoma Sister        dx in her 62s  . Dementia Sister   . Heart disease Maternal Aunt   . Prostate cancer Maternal Uncle   . Colon cancer Maternal Grandfather   . Colon cancer Maternal Uncle   . Prostate cancer Maternal Uncle   . Dementia Maternal Aunt   . Breast cancer Cousin        maternal 2nd cousin  . Breast cancer Cousin        maternal first cousin dx in her 39s  . Melanoma Son        dx in his 20s   Social History   Socioeconomic History  . Marital status: Widowed    Spouse name: Not on file  . Number of children: Not on file  . Years of education: Not on file  . Highest education level: Not on file  Occupational History  . Not on file  Tobacco Use  . Smoking status: Never Smoker  . Smokeless tobacco: Never Used  Vaping Use  . Vaping Use: Never used  Substance and Sexual Activity  . Alcohol use: No  . Drug use: No  . Sexual activity: Never    Birth control/protection: Surgical  Other Topics Concern  . Not on file  Social History Narrative   Widow. Lives alone.    3 children, 5 grandchildren.   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   Enjoys reading, watching TV.   Social  Determinants of Health   Financial Resource Strain: Low Risk   . Difficulty of Paying Living Expenses: Not hard at all  Food Insecurity: No Food Insecurity  . Worried About Charity fundraiser in the Last Year: Never true  . Ran Out of Food in the Last Year: Never true  Transportation Needs: No Transportation Needs  . Lack of Transportation (Medical): No  . Lack of Transportation (Non-Medical): No  Physical Activity: Inactive  . Days of Exercise per Week: 0 days  . Minutes of Exercise per Session: 0 min  Stress: Stress Concern Present  . Feeling of Stress : To some extent  Social Connections:   . Frequency of Communication with Friends and Family: Not on file  . Frequency of Social Gatherings with Friends and Family: Not on file  . Attends Religious Services: Not on file  . Active Member of Clubs or Organizations: Not on file  . Attends Archivist Meetings: Not on file  . 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 Yes, IBS diarrhea sometimes Does the patient have any non-healing wounds?  No  Has the patient had any unintentional weight loss or weight gain?  No   Diabetes:  Is the patient diabetic?  No  If diabetic, was a CBG obtained today?  N/A Did the patient bring in their glucometer from home?  N/A How often do you monitor your CBG's? N/A.   Financial Strains and Diabetes Management:  Are you having any  financial strains with the device, your supplies or your medication? N/A.  Does the patient want to be seen by Chronic Care Management for management of their diabetes?  N/A Would the patient like to be referred to a Nutritionist or for Diabetic Management?  N/A    Interpreter Needed?: No  Information entered by :: CJohnson, LPN   Activities of Daily Living In your present state of health, do you have any difficulty performing the following activities: 12/16/2019  Hearing? Y  Comment some hearing loss  Vision? N  Difficulty concentrating or making decisions? N  Walking or climbing stairs? N  Dressing or bathing? N  Doing errands, shopping? N  Preparing Food and eating ? N  Using the Toilet? N  In the past six months, have you accidently leaked urine? Y  Comment takes Vesicare  Do you have problems with loss of bowel control? N  Managing your Medications? N  Managing your Finances? N  Housekeeping or managing your Housekeeping? N  Some recent data might be hidden    Patient Care Team: Pleas Koch, NP as PCP - General (Internal Medicine) Annia Belt, MD (Hematology and Oncology) Eula Flax, OD as Referring Physician (Optometry) Christene Slates, MD as Consulting Physician (Dermatology)  Indicate any recent Medical Services you may have received from other than [facility] providers in the past year (date may be approximate).     Assessment:   This is a routine wellness examination for [place].  Hearing/Vision screen  Hearing Screening   125Hz  250Hz  500Hz  1000Hz  2000Hz  3000Hz  4000Hz  6000Hz  8000Hz   Right ear:           Left ear:           Vision Screening Comments: Patient gets annual eye exams  Dietary issues and exercise activities discussed: Current Exercise Habits: The patient does not participate in regular exercise at present, Exercise limited by: None identified  Goals    . Increase water intake     Starting 12/04/2017, I will continue to drink at least  6-8 glasses of water daily.     Marland Kitchen  Patient Stated     12/16/2019,  I will maintain and continue medications as prescribed.       Depression Screen PHQ 2/9 Scores 12/16/2019 12/17/2018 12/04/2017 11/21/2016 09/28/2015  PHQ - 2 Score 0 1 0 2 0  PHQ- 9 Score 0 - 0 4 -    Fall Risk Fall Risk  12/16/2019 12/17/2018 12/04/2017 11/21/2016 09/28/2015  Falls in the past year? 1 1 Yes Yes Yes  Comment tripped over speed bumps in parking lot - fell backwards while doing vacuuming; denies injury  - fell while waking down a step at a restaurant  Number falls in past yr: 1 0 1 2 or more 1  Injury with Fall? 0 0 No Yes Yes  Risk for fall due to : Medication side effect - - - -  Follow up Falls evaluation completed;Falls prevention discussed - - - Falls evaluation completed    Any stairs in or around the home? Yes  If so, are there any without handrails? No  Home free of loose throw rugs in walkways, pet beds, electrical cords, etc? Yes  Adequate lighting in your home to reduce risk of falls? Yes   ASSISTIVE DEVICES UTILIZED TO PREVENT FALLS:  Life alert? No  Use of a cane, walker or w/c? Yes , cane Grab bars in the bathroom? No  Shower chair or bench in shower? No  Elevated toilet seat or a handicapped toilet? No   TIMED UP AND GO:  Was the test performed? N/A, telephonic visit.   Cognitive Function: MMSE - Mini Mental State Exam 12/16/2019 12/04/2017 11/21/2016 09/28/2015  Orientation to time 5 5 5 5   Orientation to Place 5 5 5 5   Registration 3 3 3 3   Attention/ Calculation 5 0 0 0  Recall 3 3 3 3   Language- name 2 objects - 0 0 0  Language- repeat 1 1 1 1   Language- follow 3 step command - 3 3 3   Language- read & follow direction - 0 0 0  Write a sentence - 0 0 0  Copy design - 0 0 0  Total score - 20 20 20   Mini Cog  Mini-Cog screen was completed. Maximum score is 22. A value of 0 denotes this part of the MMSE was not completed or the patient failed this part of the Mini-Cog screening.         Immunizations Immunization History  Administered Date(s) Administered  . Fluad Quad(high Dose 65+) 12/17/2018  . Influenza,inj,Quad PF,6+ Mos 12/05/2016, 12/04/2017  . Influenza-Unspecified 12/19/2015  . PFIZER SARS-COV-2 Vaccination 04/03/2019, 04/24/2019  . Pneumococcal Conjugate-13 09/28/2015  . Pneumococcal Polysaccharide-23 03/23/2011     TDAP status: Up to date Flu Vaccine status: due, will get at upcoming physical Pneumococcal vaccine status: Up to date Covid-19 vaccine status: Completed vaccines  Qualifies for Shingles Vaccine? Yes   Zostavax completed No   Shingrix Completed?: No.    Education has been provided regarding the importance of this vaccine. Patient has been advised to call insurance company to determine out of pocket expense if they have not yet received this vaccine. Advised may also receive vaccine at local pharmacy or Health Dept. Verbalized acceptance and understanding.  Screening Tests Health Maintenance  Topic Date Due  . INFLUENZA VACCINE  10/18/2019  . PAP SMEAR-Modifier  09/27/2048 (Originally 07/10/2012)  . TETANUS/TDAP  07/02/2020  . DEXA SCAN  Completed  . COVID-19 Vaccine  Completed  . PNA vac Low Risk Adult  Completed    Health  Maintenance  Health Maintenance Due  Topic Date Due  . INFLUENZA VACCINE  10/18/2019    Colorectal cancer screening: No longer required.  Mammogram status: Completed 06/23/2019. Repeat every year Bone Density status: completed 06/23/2019, due in 2 years   Lung Cancer Screening: (Low Dose CT Chest recommended if Age 71-80 years, 30 pack-year currently smoking OR have quit w/in 15 years.) does not qualify.    Additional Screening:  Hepatitis C Screening: does not qualify; Completed N/A  Vision Screening: Recommended annual ophthalmology exams for early detection of glaucoma and other disorders of the eye. Is the patient up to date with their annual eye exam?  Yes  Who is the provider or what is the name of  the office in which the patient attends annual eye exams? Dr. Glennon Mac, [place]  If pt is not established with a provider, would they like to be referred to a provider to establish care? No .   Dental Screening: Recommended annual dental exams for proper oral hygiene  Community Resource Referral / Chronic Care Management: CRR required this visit?  No   CCM required this visit?  No      Plan:     I have personally reviewed and noted the following in the patient's chart:   . Medical and social history . Use of alcohol, tobacco or illicit drugs  . Current medications and supplements . Functional ability and status . Nutritional status . Physical activity . Advanced directives . List of other physicians . Hospitalizations, surgeries, and ER visits in previous 12 months . Vitals . Screenings to include cognitive, depression, and falls . Referrals and appointments  In addition, I have reviewed and discussed with patient certain preventive protocols, quality metrics, and best practice recommendations. A written personalized care plan for preventive services as well as general preventive health recommendations were provided to patient.   Due to this being a telephonic visit, the after visit summary with patients personalized plan was offered to patient via office or my-chart. Patient preferred to pick up at office at next visit or via mychart.   Andrez Grime, LPN   [id number]

## 2019-12-16 NOTE — Patient Instructions (Signed)
Ashlee Lopez , Thank you for taking time to come for your Medicare Wellness Visit. I appreciate your ongoing commitment to your health goals. Please review the following plan we discussed and let me know if I can assist you in the future.   Screening recommendations/referrals: Colonoscopy: no longer required Mammogram: Up to date, completed 06/23/2019, due 06/2020 Bone Density: Up to date, completed 06/23/2019, due 06/2021 Recommended yearly ophthalmology/optometry visit for glaucoma screening and checkup Recommended yearly dental visit for hygiene and checkup  Vaccinations: Influenza vaccine: due, will get at upcoming physical Pneumococcal vaccine: Completed series Tdap vaccine: Up to date, completed 07/03/2010, due 06/2020 Shingles vaccine: due, check with your insurance regarding coverage if interested   Covid-19:Completed series  Advanced directives: Please bring a copy of your POA (Power of Leonore) and/or Living Will to your next appointment.   Conditions/risks identified: hypertension, hyperlipidemia  Next appointment: Follow up in one year for your annual wellness visit    Preventive Care 70 Years and Older, Female Preventive care refers to lifestyle choices and visits with your health care provider that can promote health and wellness. What does preventive care include?  A yearly physical exam. This is also called an annual well check.  Dental exams once or twice a year.  Routine eye exams. Ask your health care provider how often you should have your eyes checked.  Personal lifestyle choices, including:  Daily care of your teeth and gums.  Regular physical activity.  Eating a healthy diet.  Avoiding tobacco and drug use.  Limiting alcohol use.  Practicing safe sex.  Taking low-dose aspirin every day.  Taking vitamin and mineral supplements as recommended by your health care provider. What happens during an annual well check? The services and screenings done by your  health care provider during your annual well check will depend on your age, overall health, lifestyle risk factors, and family history of disease. Counseling  Your health care provider may ask you questions about your:  Alcohol use.  Tobacco use.  Drug use.  Emotional well-being.  Home and relationship well-being.  Sexual activity.  Eating habits.  History of falls.  Memory and ability to understand (cognition).  Work and work Statistician.  Reproductive health. Screening  You may have the following tests or measurements:  Height, weight, and BMI.  Blood pressure.  Lipid and cholesterol levels. These may be checked every 5 years, or more frequently if you are over 48 years old.  Skin check.  Lung cancer screening. You may have this screening every year starting at age 32 if you have a 30-pack-year history of smoking and currently smoke or have quit within the past 15 years.  Fecal occult blood test (FOBT) of the stool. You may have this test every year starting at age 16.  Flexible sigmoidoscopy or colonoscopy. You may have a sigmoidoscopy every 5 years or a colonoscopy every 10 years starting at age 72.  Hepatitis C blood test.  Hepatitis B blood test.  Sexually transmitted disease (STD) testing.  Diabetes screening. This is done by checking your blood sugar (glucose) after you have not eaten for a while (fasting). You may have this done every 1-3 years.  Bone density scan. This is done to screen for osteoporosis. You may have this done starting at age 68.  Mammogram. This may be done every 1-2 years. Talk to your health care provider about how often you should have regular mammograms. Talk with your health care provider about your test results, treatment  options, and if necessary, the need for more tests. Vaccines  Your health care provider may recommend certain vaccines, such as:  Influenza vaccine. This is recommended every year.  Tetanus, diphtheria, and  acellular pertussis (Tdap, Td) vaccine. You may need a Td booster every 10 years.  Zoster vaccine. You may need this after age 35.  Pneumococcal 13-valent conjugate (PCV13) vaccine. One dose is recommended after age 64.  Pneumococcal polysaccharide (PPSV23) vaccine. One dose is recommended after age 9. Talk to your health care provider about which screenings and vaccines you need and how often you need them. This information is not intended to replace advice given to you by your health care provider. Make sure you discuss any questions you have with your health care provider. Document Released: 04/01/2015 Document Revised: 11/23/2015 Document Reviewed: 01/04/2015 Elsevier Interactive Patient Education  2017 [place] Prevention in the Home Falls can cause injuries. They can happen to people of all ages. There are many things you can do to make your home safe and to help prevent falls. What can I do on the outside of my home?  Regularly fix the edges of walkways and driveways and fix any cracks.  Remove anything that might make you trip as you walk through a door, such as a raised step or threshold.  Trim any bushes or trees on the path to your home.  Use bright outdoor lighting.  Clear any walking paths of anything that might make someone trip, such as rocks or tools.  Regularly check to see if handrails are loose or broken. Make sure that both sides of any steps have handrails.  Any raised decks and porches should have guardrails on the edges.  Have any leaves, snow, or ice cleared regularly.  Use sand or salt on walking paths during winter.  Clean up any spills in your garage right away. This includes oil or grease spills. What can I do in the bathroom?  Use night lights.  Install grab bars by the toilet and in the tub and shower. Do not use towel bars as grab bars.  Use non-skid mats or decals in the tub or shower.  If you need to sit down in the shower, use  a plastic, non-slip stool.  Keep the floor dry. Clean up any water that spills on the floor as soon as it happens.  Remove soap buildup in the tub or shower regularly.  Attach bath mats securely with double-sided non-slip rug tape.  Do not have throw rugs and other things on the floor that can make you trip. What can I do in the bedroom?  Use night lights.  Make sure that you have a light by your bed that is easy to reach.  Do not use any sheets or blankets that are too big for your bed. They should not hang down onto the floor.  Have a firm chair that has side arms. You can use this for support while you get dressed.  Do not have throw rugs and other things on the floor that can make you trip. What can I do in the kitchen?  Clean up any spills right away.  Avoid walking on wet floors.  Keep items that you use a lot in easy-to-reach places.  If you need to reach something above you, use a strong step stool that has a grab bar.  Keep electrical cords out of the way.  Do not use floor polish or wax that makes floors slippery.  If you must use wax, use non-skid floor wax.  Do not have throw rugs and other things on the floor that can make you trip. What can I do with my stairs?  Do not leave any items on the stairs.  Make sure that there are handrails on both sides of the stairs and use them. Fix handrails that are broken or loose. Make sure that handrails are as long as the stairways.  Check any carpeting to make sure that it is firmly attached to the stairs. Fix any carpet that is loose or worn.  Avoid having throw rugs at the top or bottom of the stairs. If you do have throw rugs, attach them to the floor with carpet tape.  Make sure that you have a light switch at the top of the stairs and the bottom of the stairs. If you do not have them, ask someone to add them for you. What else can I do to help prevent falls?  Wear shoes that:  Do not have high heels.  Have  rubber bottoms.  Are comfortable and fit you well.  Are closed at the toe. Do not wear sandals.  If you use a stepladder:  Make sure that it is fully opened. Do not climb a closed stepladder.  Make sure that both sides of the stepladder are locked into place.  Ask someone to hold it for you, if possible.  Clearly mark and make sure that you can see:  Any grab bars or handrails.  First and last steps.  Where the edge of each step is.  Use tools that help you move around (mobility aids) if they are needed. These include:  Canes.  Walkers.  Scooters.  Crutches.  Turn on the lights when you go into a dark area. Replace any light bulbs as soon as they burn out.  Set up your furniture so you have a clear path. Avoid moving your furniture around.  If any of your floors are uneven, fix them.  If there are any pets around you, be aware of where they are.  Review your medicines with your doctor. Some medicines can make you feel dizzy. This can increase your chance of falling. Ask your doctor what other things that you can do to help prevent falls. This information is not intended to replace advice given to you by your health care provider. Make sure you discuss any questions you have with your health care provider. Document Released: 12/30/2008 Document Revised: 08/11/2015 Document Reviewed: 04/09/2014 Elsevier Interactive Patient Education  2017 [employer].

## 2019-12-16 NOTE — Progress Notes (Signed)
PCP notes:  Health Maintenance: Flu- due   Abnormal Screenings: none   Patient concerns: Difficulty lifting arms above her head   Nurse concerns: none   Next PCP appt.: 12/23/2019 @ 2:20 pm

## 2019-12-17 LAB — COMPREHENSIVE METABOLIC PANEL
ALT: 27 IU/L (ref 0–32)
AST: 35 IU/L (ref 0–40)
Albumin/Globulin Ratio: 1.8 (ref 1.2–2.2)
Albumin: 4.4 g/dL (ref 3.6–4.6)
Alkaline Phosphatase: 68 IU/L (ref 44–121)
BUN/Creatinine Ratio: 25 (ref 12–28)
BUN: 17 mg/dL (ref 8–27)
Bilirubin Total: 0.7 mg/dL (ref 0.0–1.2)
CO2: 25 mmol/L (ref 20–29)
Calcium: 9.8 mg/dL (ref 8.7–10.3)
Chloride: 96 mmol/L (ref 96–106)
Creatinine, Ser: 0.69 mg/dL (ref 0.57–1.00)
GFR calc Af Amer: 92 mL/min/{1.73_m2} (ref >59)
GFR calc non Af Amer: 80 mL/min/{1.73_m2} (ref >59)
Globulin, Total: 2.5 g/dL (ref 1.5–4.5)
Glucose: 96 mg/dL (ref 65–99)
Potassium: 3.7 mmol/L (ref 3.5–5.2)
Sodium: 134 mmol/L (ref 134–144)
Total Protein: 6.9 g/dL (ref 6.0–8.5)

## 2019-12-17 LAB — LIPID PANEL
Chol/HDL Ratio: 2.6 ratio (ref 0.0–4.4)
Cholesterol, Total: 165 mg/dL (ref 100–199)
HDL: 64 mg/dL (ref >39)
LDL Chol Calc (NIH): 87 mg/dL (ref 0–99)
Triglycerides: 75 mg/dL (ref 0–149)
VLDL Cholesterol Cal: 14 mg/dL (ref 5–40)

## 2019-12-17 LAB — HEMOGLOBIN A1C
Est. average glucose Bld gHb Est-mCnc: 123 mg/dL
Hgb A1c MFr Bld: 5.9 % — ABNORMAL HIGH (ref 4.8–5.6)

## 2019-12-17 LAB — TSH: TSH: 6.53 u[IU]/mL — ABNORMAL HIGH (ref 0.450–4.500)

## 2019-12-23 ENCOUNTER — Encounter: Payer: Self-pay | Admitting: Primary Care

## 2019-12-23 ENCOUNTER — Ambulatory Visit (INDEPENDENT_AMBULATORY_CARE_PROVIDER_SITE_OTHER): Payer: Medicare Other | Admitting: Primary Care

## 2019-12-23 ENCOUNTER — Other Ambulatory Visit: Payer: Self-pay

## 2019-12-23 VITALS — BP 146/82 | HR 86 | Temp 97.6°F | Ht 64.0 in | Wt 148.0 lb

## 2019-12-23 DIAGNOSIS — N390 Urinary tract infection, site not specified: Secondary | ICD-10-CM | POA: Diagnosis not present

## 2019-12-23 DIAGNOSIS — I10 Essential (primary) hypertension: Secondary | ICD-10-CM

## 2019-12-23 DIAGNOSIS — K5730 Diverticulosis of large intestine without perforation or abscess without bleeding: Secondary | ICD-10-CM | POA: Diagnosis not present

## 2019-12-23 DIAGNOSIS — F411 Generalized anxiety disorder: Secondary | ICD-10-CM | POA: Diagnosis not present

## 2019-12-23 DIAGNOSIS — E785 Hyperlipidemia, unspecified: Secondary | ICD-10-CM | POA: Diagnosis not present

## 2019-12-23 DIAGNOSIS — E039 Hypothyroidism, unspecified: Secondary | ICD-10-CM

## 2019-12-23 DIAGNOSIS — K589 Irritable bowel syndrome without diarrhea: Secondary | ICD-10-CM | POA: Diagnosis not present

## 2019-12-23 DIAGNOSIS — Z853 Personal history of malignant neoplasm of breast: Secondary | ICD-10-CM

## 2019-12-23 DIAGNOSIS — Z23 Encounter for immunization: Secondary | ICD-10-CM

## 2019-12-23 MED ORDER — LOSARTAN POTASSIUM 100 MG PO TABS: 100.0000 mg | ORAL_TABLET | Freq: Every day | ORAL | 0 refills | Status: DC

## 2019-12-23 NOTE — Assessment & Plan Note (Addendum)
Recent lipid panel at goal.  Continue Crestor.   Agree with assessment and plan. Pleas Koch, NP

## 2019-12-23 NOTE — Assessment & Plan Note (Addendum)
Reports intermittent symptoms of constipation vs diarrhea, overall well controled.    Continue to monitor.  Agree with assessment and plan. Pleas Koch, NP

## 2019-12-23 NOTE — Assessment & Plan Note (Signed)
Compliant to Premarin cream. Recent UTI last month.  Culture positive for E. coli.  Following with urology.

## 2019-12-23 NOTE — Assessment & Plan Note (Addendum)
Mammogram Updated 4/21.  S/P mastectomy. Breast exam today unremarkable.   Agree with assessment and plan. Pleas Koch, NP

## 2019-12-23 NOTE — Assessment & Plan Note (Deleted)
Immunizations: UTD, denies shingrix at this time. Mammogram: UTD DEXA: UTD, Colonoscopy: UTD  Dicussed the importance of healthy diet & regular exercise.

## 2019-12-23 NOTE — Assessment & Plan Note (Addendum)
BP elevated in office today 146/82, has been complaint to prescribed medication regimen of Amlodipine, HTZC & losartan.  Due to her concerned that the amlodipine is causing pedal edema, will discontinue and increase losartan dose.   She will begin taking blood pressure daily and follow up in 3 weeks.   Agree with assessment and plan. Pleas Koch, NP

## 2019-12-23 NOTE — Patient Instructions (Addendum)
You may stop taking your Amlodipine (Norvasc) to see if this will improve the swelling in your lower legs.  Increase Losartan (Cozaar) to 100 mg by mouth once daily     Start exercising. You should be getting 150 minutes of moderate intensity exercise weekly.  It's important to improve your diet by reducing consumption of fast food, fried food, processed snack foods, sugary drinks. Increase consumption of fresh vegetables and fruits, whole grains, water.  Ensure you are drinking 64 ounces of water daily.   Influenza (Flu) Vaccine (Inactivated or Recombinant): What You Need to Know 1. Why get vaccinated? Influenza vaccine can prevent influenza (flu). Flu is a contagious disease that spreads around the [place] every year, usually between October and May. Anyone can get the flu, but it is more dangerous for some people. Infants and young children, people 107 years of age and older, pregnant women, and people with certain health conditions or a weakened immune system are at greatest risk of flu complications. Pneumonia, bronchitis, sinus infections and ear infections are examples of flu-related complications. If you have a medical condition, such as heart disease, cancer or diabetes, flu can make it worse. Flu can cause fever and chills, sore throat, muscle aches, fatigue, cough, headache, and runny or stuffy nose. Some people may have vomiting and diarrhea, though this is more common in children than adults. Each year thousands of people in the [place] die from flu, and many more are hospitalized. Flu vaccine prevents millions of illnesses and flu-related visits to the doctor each year. 2. Influenza vaccine CDC recommends everyone 65 months of age and older get vaccinated every flu season. Children 6 months through 59 years of age may need 2 doses during a single flu season. Everyone else needs only 1 dose each flu season. It takes about 2 weeks for protection to develop after  vaccination. There are many flu viruses, and they are always changing. Each year a new flu vaccine is made to protect against three or four viruses that are likely to cause disease in the upcoming flu season. Even when the vaccine doesn't exactly match these viruses, it may still provide some protection. Influenza vaccine does not cause flu. Influenza vaccine may be given at the same time as other vaccines. 3. Talk with your health care provider Tell your vaccine provider if the person getting the vaccine:  Has had an allergic reaction after a previous dose of influenza vaccine, or has any severe, life-threatening allergies.  Has ever had Guillain-Barr Syndrome (also called GBS). In some cases, your health care provider may decide to postpone influenza vaccination to a future visit. People with minor illnesses, such as a cold, may be vaccinated. People who are moderately or severely ill should usually wait until they recover before getting influenza vaccine. Your health care provider can give you more information. 4. Risks of a vaccine reaction  Soreness, redness, and swelling where shot is given, fever, muscle aches, and headache can happen after influenza vaccine.  There may be a very small increased risk of Guillain-Barr Syndrome (GBS) after inactivated influenza vaccine (the flu shot). Young children who get the flu shot along with pneumococcal vaccine (PCV13), and/or DTaP vaccine at the same time might be slightly more likely to have a seizure caused by fever. Tell your health care provider if a child who is getting flu vaccine has ever had a seizure. People sometimes faint after medical procedures, including vaccination. Tell your provider if you feel dizzy or  have vision changes or ringing in the ears. As with any medicine, there is a very remote chance of a vaccine causing a severe allergic reaction, other serious injury, or death. 5. What if there is a serious problem? An allergic  reaction could occur after the vaccinated person leaves the clinic. If you see signs of a severe allergic reaction (hives, swelling of the face and throat, difficulty breathing, a fast heartbeat, dizziness, or weakness), call 9-1-1 and get the person to the nearest hospital. For other signs that concern you, call your health care provider. Adverse reactions should be reported to the Vaccine Adverse Event Reporting System (VAERS). Your health care provider will usually file this report, or you can do it yourself. Visit the VAERS website at www.vaers.SamedayNews.es or call 416-461-6059.VAERS is only for reporting reactions, and VAERS staff do not give medical advice. 6. The National Vaccine Injury Compensation Program The [employer] Vaccine Injury Compensation Program (VICP) is a federal program that was created to compensate people who may have been injured by certain vaccines. Visit the VICP website at GoldCloset.com.ee or call 864-286-4011 to learn about the program and about filing a claim. There is a time limit to file a claim for compensation. 7. How can I learn more?  Ask your healthcare provider.  Call your local or state health department.  Contact the Centers for Disease Control and Prevention (CDC): ? Call 878-750-1219 (1-800-CDC-INFO) or ? Visit CDC's https://gibson.com/ Vaccine Information Statement (Interim) Inactivated Influenza Vaccine (10/31/2017) This information is not intended to replace advice given to you by your health care provider. Make sure you discuss any questions you have with your health care provider. Document Revised: 06/24/2018 Document Reviewed: 11/04/2017 Elsevier Patient Education  [employer].

## 2019-12-23 NOTE — Assessment & Plan Note (Signed)
Chronic, frequent symptoms.  Overall does not wish to take anything for anxiety.  Continue to monitor.

## 2019-12-23 NOTE — Progress Notes (Signed)
Subjective:    Patient ID: Ashlee Lopez, female    DOB: December 31, 1933, 86 y.o.   MRN: [id number]  HPI  This visit occurred during the SARS-CoV-2 public health emergency.  Safety protocols were in place, including screening questions prior to the visit, additional usage of staff PPE, and extensive cleaning of exam room while observing appropriate contact time as indicated for disinfecting solutions.   Ashlee Lopez is a 86 year old female who presents today for follow up of chronic conditions.  She would also like to discuss ankle/pedal edema for which she believes is from her Amlodipine. The edema is mild overall   Immunizations: -Influenza: Due today -Shingles: Declines  -Pneumonia: UTD -Covid-19: Completed series   Diet: She endorses a healthy diet. Exercise: No regular exercise  Eye exam: Completes annually  Dental exam: Due and will schedule  Mammogram: Completed in April 2021 Dexa: Completed in 2020 Colonoscopy: Completed in 2014, no further study needed. Hep C Screen: Negative   BP Readings from Last 3 Encounters:  12/23/19 (!) 146/82  11/26/19 (!) 165/86  10/07/19 (!) 176/81     Review of Systems  Constitutional: Negative for unexpected weight change.  HENT: Negative for rhinorrhea.   Respiratory: Negative for cough and shortness of breath.   Cardiovascular: Negative for chest pain.  Gastrointestinal: Negative for constipation and diarrhea.  Genitourinary: Negative for difficulty urinating.  Musculoskeletal: Negative for arthralgias.  Skin: Negative for rash.  Allergic/Immunologic: Negative for environmental allergies.  Neurological: Negative for dizziness, numbness and headaches.  Psychiatric/Behavioral: The patient is nervous/anxious.        Past Medical History:  Diagnosis Date  . Benign essential HTN 03/21/2011  . Breast cancer, stage 1 ([place]) 02/10/2003   Right tubular breast cancer  . Cancer ([place])   . Diverticulosis of colon 03/23/2011  . Family history  of breast cancer   . Family history of colon cancer   . Family history of melanoma   . Family history of prostate cancer   . Fibrocystic disease of breast 03/23/2011  . Graves' disease with exophthalmos 03/23/2011  . Hypertension   . Hypothyroid 03/21/2011  . IBS (irritable bowel syndrome) 03/23/2011  . ITP (idiopathic thrombocytopenic purpura) 03/21/2011  . S/P splenectomy 03/21/2011  . 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 Varicose vein of leg 03/23/2011     Social History   Socioeconomic History  . Marital status: Widowed    Spouse name: Not on file  . Number of children: Not on file  . Years of education: Not on file  . Highest education level: Not on file  Occupational History  . Not on file  Tobacco Use  . Smoking status: Never Smoker  . Smokeless tobacco: Never Used  Vaping Use  . Vaping Use: Never used  Substance and Sexual Activity  . Alcohol use: No  . Drug use: No  . Sexual activity: Never    Birth control/protection: Surgical  Other Topics Concern  . Not on file  Social History Narrative   Widow. Lives alone.    3 children, 5 grandchildren.   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   Enjoys reading, watching TV.   Social Determinants of Health   Financial Resource Strain: Low Risk   . Difficulty of Paying Living Expenses: Not hard at all  Food Insecurity: No Food Insecurity  . Worried About Charity fundraiser in the Last Year: Never true  . Ran Out of Food  in the Last Year: Never true  Transportation Needs: No Transportation Needs  . Lack of Transportation (Medical): No  . Lack of Transportation (Non-Medical): No  Physical Activity: Inactive  . Days of Exercise per Week: 0 days  . Minutes of Exercise per Session: 0 min  Stress: Stress Concern Present  . Feeling of Stress : To some extent  Social Connections:   . Frequency of Communication with Friends and Family: Not on file  . Frequency of Social  Gatherings with Friends and Family: Not on file  . Attends Religious Services: Not on file  . Active Member of Clubs or Organizations: Not on file  . Attends Archivist Meetings: Not on file  . Marital Status: Not on file  Intimate Partner Violence: Not At Risk  . Fear of Current or Ex-Partner: No  . Emotionally Abused: No  . Physically Abused: No  . Sexually Abused: No    Past Surgical History:  Procedure Laterality Date  . ABDOMINAL HYSTERECTOMY  2001  . APPENDECTOMY  1941  . BREAST SURGERY  02/17/2003   Mastectomy-Right  . MASTECTOMY  2004   Dr Margot Chimes  . OOPHORECTOMY     BSO  . SKIN CANCER EXCISION  2019   removal of cancer from ear  . SPLENECTOMY  1986    Family History  Problem Relation Age of Onset  . Breast cancer Mother        Age 28  . Hypertension Father   . Heart disease Father   . Lung cancer Father   . Breast cancer Sister        Age 12  . Melanoma Sister        dx in her 33s  . Dementia Sister   . Heart disease Maternal Aunt   . Prostate cancer Maternal Uncle   . Colon cancer Maternal Grandfather   . Colon cancer Maternal Uncle   . Prostate cancer Maternal Uncle   . Dementia Maternal Aunt   . Breast cancer Cousin        maternal 2nd cousin  . Breast cancer Cousin        maternal first cousin dx in her 32s  . Melanoma Son        dx in his 71s    Allergies  Allergen Reactions  . Sulfamethoxazole-Trimethoprim Hives  . Septra [Bactrim]     Current Outpatient Medications on File Prior to Visit  Medication Sig Dispense Refill  . aspirin 81 MG tablet Take 81 mg by mouth daily.      . Calcium Carbonate-Vitamin D 600-200 MG-UNIT TABS Take by mouth.    Marland Kitchen CALCIUM-VITAMIN D PO Take by mouth.      . conjugated estrogens (PREMARIN) vaginal cream Apply 0.5mg  (pea-sized amount)  just inside the vaginal introitus with a finger-tip on  Monday, Wednesday and Friday nights. 30 g 12  . CRANBERRY PO Take 3 tablets by mouth daily.    .  Diphenhydramine-APAP, sleep, (TYLENOL PM EXTRA STRENGTH PO) Take by mouth.      . hydrochlorothiazide (HYDRODIURIL) 25 MG tablet TAKE 1 TABLET BY MOUTH ONCE DAILY FOR BLOOD PRESSURE 90 tablet 1  . levothyroxine (SYNTHROID) 100 MCG tablet TAKE 1 TAB BY MOUTH ONCE DAILY. TAKE ON AN EMPTY STOMACH WITH A GLASS OF WATER ATLEAST 30-60 MINUTES BEFORE BREAKFAST 90 tablet 1  . metoprolol tartrate (LOPRESSOR) 50 MG tablet TAKE 1 TABLET BY MOUTH TWICE A DAY 180 tablet 1  . nitrofurantoin, macrocrystal-monohydrate, (MACROBID) 100 MG  capsule Take 1 capsule (100 mg total) by mouth daily. 30 capsule 5  . omeprazole (PRILOSEC) 20 MG capsule TAKE 1 CAPSULE BY MOUTH ONCE DAILY FOR HEARTBURN 90 capsule 1  . rosuvastatin (CRESTOR) 5 MG tablet TAKE 1 TABLET BY MOUTH EVERY OTHER DAY 90 tablet 0  . solifenacin (VESICARE) 5 MG tablet Take 1 tablet (5 mg total) by mouth daily. 30 tablet 3   No current facility-administered medications on file prior to visit.    BP (!) 146/82   Pulse 86   Temp 97.6 F (36.4 C) (Temporal)   Ht 5\' 4"  (1.626 m)   Wt 148 lb (67.1 kg)   SpO2 96%   BMI 25.40 kg/m    Objective:   Physical Exam HENT:     Right Ear: Tympanic membrane and ear canal normal.     Left Ear: Tympanic membrane and ear canal normal.  Eyes:     Pupils: Pupils are equal, round, and reactive to light.  Cardiovascular:     Rate and Rhythm: Normal rate and regular rhythm.  Pulmonary:     Effort: Pulmonary effort is normal.     Breath sounds: Normal breath sounds.  Chest:     Breasts:        Right: Absent. No mass, skin change or tenderness.        Left: No mass, skin change or tenderness.  Abdominal:     General: Bowel sounds are normal.     Palpations: Abdomen is soft.     Tenderness: There is no abdominal tenderness.  Musculoskeletal:        General: Normal range of motion.     Cervical back: Neck supple.  Skin:    General: Skin is warm and dry.  Neurological:     Mental Status: She is alert and  oriented to person, place, and time.     Cranial Nerves: No cranial nerve deficit.     Deep Tendon Reflexes:     Reflex Scores:      Patellar reflexes are 2+ on the right side and 2+ on the left side. Psychiatric:        Mood and Affect: Mood normal.            Assessment & Plan:

## 2019-12-23 NOTE — Assessment & Plan Note (Signed)
No recent flares. Last colonoscopy in 2014.

## 2019-12-23 NOTE — Assessment & Plan Note (Addendum)
Taking levothyroxine appropriately. Recent TSH 6.5.   Given that she's been on 100 mcg for many years and stable, will start by repeating this level in 3 weeks.   Agree with assessment and plan. Pleas Koch, NP

## 2019-12-23 NOTE — Progress Notes (Signed)
Subjective:    Patient ID: Ashlee Lopez, female    DOB: [birth year], 86 y.o.   MRN: [id number]  HPI   This visit occurred during the SARS-CoV-2 public health emergency.  Safety protocols were in place, including screening questions prior to the visit, additional usage of staff PPE, and extensive cleaning of exam room while observing appropriate contact time as indicated for disinfecting solutions.   Ashlee Lopez is a 86 year old female who presents today for complete physical.  Immunizations: -Influenza: DUE  -Shingles: Denies  -Pneumonia:  UTD -Covid-19: Completed 04/24/19  Diet: Reports a fair diet, mostly avoids fried and fatty foods. Trys to avoid excessive sweets.   Exercise: No routine exercise currently.  Eye exam: Due this month Dental exam: Due   Mammogram: UTD completed in 4/21 Dexa: UTD Colonoscopy: Completed in 2014   BP Readings from Last 3 Encounters:  12/23/19 (!) 146/82  11/26/19 (!) 165/86  10/07/19 (!) 176/81   Hypertension managed on Amlodipine, HTZC & losartan . Mildly elevated in office, patient has not been checking at home, she did check yesterday and was around 150/80. She is concerned that amlodipine is causing pedal edema. She states she has had swelling in bilateral ankles for over 1 year which seems better in the morning and throughout day becomes worse. This swelling makes her feet feel heavy and is afraid of falling. No recent falls and does use a cane when leaving her home.    Hypothyroidism managed on levothyroxine. She is taking this medication correctly.      Review of Systems  Constitutional: Negative.   HENT: Negative.   Eyes: Negative.   Respiratory: Negative.  Negative for chest tightness and shortness of breath.   Cardiovascular: Negative for chest pain and palpitations.  Gastrointestinal: Negative.  Negative for abdominal pain and vomiting.  Endocrine: Negative.   Genitourinary: Negative.   Musculoskeletal: Negative.   Skin:  Negative.   Allergic/Immunologic: Negative.   Neurological: Negative.  Negative for dizziness, light-headedness and headaches.  Hematological: Negative.   Psychiatric/Behavioral: Negative.         Objective:   Physical Exam Constitutional:      Appearance: Normal appearance.  HENT:     Head: Normocephalic.     Right Ear: Tympanic membrane normal.     Left Ear: Tympanic membrane normal.     Mouth/Throat:     Mouth: Mucous membranes are dry.  Eyes:     Pupils: Pupils are equal, round, and reactive to light.  Cardiovascular:     Rate and Rhythm: Normal rate and regular rhythm.     Pulses: Normal pulses.     Heart sounds: Normal heart sounds.  Pulmonary:     Effort: Pulmonary effort is normal.     Breath sounds: Normal breath sounds.  Abdominal:     General: Bowel sounds are normal.     Palpations: Abdomen is soft.  Musculoskeletal:        General: Normal range of motion.  Skin:    General: Skin is warm and dry.     Capillary Refill: Capillary refill takes less than 2 seconds.  Neurological:     General: No focal deficit present.     Mental Status: She is alert and oriented to person, place, and time.  Psychiatric:        Mood and Affect: Mood normal.        Behavior: Behavior normal.           Assessment & Plan:

## 2019-12-25 ENCOUNTER — Telehealth: Payer: Self-pay

## 2019-12-25 NOTE — Telephone Encounter (Signed)
Pt said was reviewing the lab results and pt could not find the platelet results; pt has hx ITP; I did not see cbc at most recent lab draw. Pt wants to know if can have cbc done at 01/13/20 OV that is already scheduled. Pt also wanted to know how much time pt had to wait until could get covid booster since high dose flu shot on 12/23/19. Advised pt no wait time between covid booster and flu shot. Pt voiced understanding and pt request cb next wk that pt will be able to get cbc done at 01/13/20 appt.

## 2019-12-26 NOTE — Telephone Encounter (Signed)
Please notify patient that we will obtain her CBC at the next office visit. We will also recheck her kidney function and thyroid function.

## 2019-12-28 NOTE — Telephone Encounter (Signed)
Called patient reviewed all information and repeated back to me. Will call if any questions.  ? ?

## 2020-01-01 ENCOUNTER — Ambulatory Visit: Payer: Self-pay | Admitting: Urology

## 2020-01-01 ENCOUNTER — Encounter: Payer: Self-pay | Admitting: Urology

## 2020-01-05 ENCOUNTER — Encounter: Payer: Self-pay | Admitting: Primary Care

## 2020-01-05 ENCOUNTER — Ambulatory Visit (INDEPENDENT_AMBULATORY_CARE_PROVIDER_SITE_OTHER): Payer: Medicare Other | Admitting: Primary Care

## 2020-01-05 ENCOUNTER — Other Ambulatory Visit: Payer: Self-pay

## 2020-01-05 ENCOUNTER — Ambulatory Visit: Payer: Medicare Other | Admitting: Internal Medicine

## 2020-01-05 ENCOUNTER — Telehealth: Payer: Self-pay

## 2020-01-05 DIAGNOSIS — I10 Essential (primary) hypertension: Secondary | ICD-10-CM

## 2020-01-05 NOTE — Telephone Encounter (Signed)
[facility] Night - Client TELEPHONE ADVICE RECORD [facility] Patient Name: Ashlee Lopez Gender: Female DOB: [birth year] Age: 86 Y 57 M 6 D Return Phone Number: [phone] (Primary) [place] [address] Client [facility] Night - Client Client Site [place] Physician Alma Friendly - NP Contact Type Call Who Is Calling Patient / Member / Family / Caregiver Call Type Triage / Clinical Caller Name Itxel Wickard Relationship To Patient Son Return Phone Number [phone] (Primary) Chief Complaint Blood Pressure High Reason for Call Symptomatic / Request for [place] states his mothers BP has been elevated and over the last 3 hours these are her reading, 173/106, 157/104 and 179/108 Translation No Nurse Assessment Nurse: Danford Bad, RN, Anderson Malta Date/Time (Eastern Time): 01/04/2020 9:04:33 PM Confirm and document reason for call. If symptomatic, describe symptoms. ---Caller states his mothers BP has been elevated and over the last 3 hours these are her reading, 173/106, 157/104 and 179/108. They have recently taken Amlodipine, but unsure if they altered any other meds. She feels anxious about the BP. Does the patient have any new or worsening symptoms? ---Yes Will a triage be completed? ---Yes Related visit to physician within the last 2 weeks? ---No Does the PT have any chronic conditions? (i.e. diabetes, asthma, this includes High risk factors for pregnancy, etc.) ---Yes List chronic conditions. ---HTN Is this a behavioral health or substance abuse call? ---No Guidelines Guideline Title Affirmed Question Affirmed Notes Nurse Date/Time (Eastern Time) Blood Pressure - High Systolic BP >= 165 OR Diastolic >= 537 Kathaleen Bury 01/04/2020 9:05:49 PM Disp. Time Eilene Ghazi Time) Disposition Final User 01/04/2020 9:10:21 PM SEE PCP WITHIN 3  DAYS Yes Danford Bad, RN, Nell Range Disagree/Comply Comply PLEASE NOTE: All timestamps contained within this report are represented as [place] Standard Time. CONFIDENTIALTY NOTICE: This fax transmission is intended only for the addressee. It contains information that is legally privileged, confidential or otherwise protected from use or disclosure. If you are not the intended recipient, you are strictly prohibited from reviewing, disclosing, copying using or disseminating any of this information or taking any action in reliance on or regarding this information. If you have received this fax in error, please notify us immediately by telephone so that we can arrange for its return to [place]. Phone: [phone], Toll-Free: [phone], Fax: [phone] Page: 2 of 2 Call Id: [id number] [place] Understands Yes PreDisposition Call Doctor Care Advice Given Per Guideline SEE PCP WITHIN 3 DAYS: * You need to be seen within 2 or 3 days. * PCP VISIT: Call your doctor (or NP/PA) during regular office hours and make an appointment. A clinic or urgent care center are good places to go for care if your doctor's office is closed or you can't get an appointment. NOTE: If office will be open tomorrow, tell caller to call then, not in 3 days. CALL BACK IF: * Weakness or numbness of the face, arm or leg on one side of the body occurs * Difficulty walking, difficulty talking, or severe headache occurs * Your blood pressure is over 180/110 * You become worse CARE ADVICE given per High Blood Pressure (Adult) guideline. Referrals REFERRED TO PCP OFFICE

## 2020-01-05 NOTE — Telephone Encounter (Signed)
Per appt notes pt has appt 01/05/20 at 12:20 with Gentry Fitz NP.

## 2020-01-05 NOTE — Telephone Encounter (Signed)
[facility] Night - Client Nonclinical Telephone Record [facility] Client [facility] Night - Client Client Site [place] Physician Alma Friendly - NP Contact Type Call Who Is Calling Patient / Member / Family / Caregiver Caller Name [place] Phone Number [phone] Patient Name Ashlee Lopez Patient DOB August 29, 1933 Call Type Message Only Information Provided Reason for Call Request to Schedule Office Appointment Initial Comment Caller states he would like to schedule appt for mother. He was instructed by triage nurse to make an appt. for today for elevated blood pressure Disp. Time Disposition Final User 01/05/2020 8:09:06 AM General Information Provided Yes Idolina Primer Call Closed By: Idolina Primer Transaction Date/Time: 01/05/2020 8:05:14 AM (ET)

## 2020-01-05 NOTE — Telephone Encounter (Signed)
Yes, will be adding patient on at 12:20 pm today

## 2020-01-05 NOTE — Telephone Encounter (Signed)
Per this note Robin at front desk spoke with someone and appt already scheduled today at 12:20 with Gentry Fitz NP(Robin spoke with Gentry Fitz NP earlier).

## 2020-01-05 NOTE — Assessment & Plan Note (Signed)
BP initially 190's/low 100's, improved to 148/92 after resting for 20 minutes. Do suspect that her elevations are partially due to anxiety. She has seemed to increase since removing amlodipine earlier this year.  Resume amlodipine at 5 mg for now. Continue losartan, HCTZ, metoprolol tartrate. We will plan to see her back next week as scheduled.

## 2020-01-05 NOTE — Progress Notes (Signed)
Subjective:    Patient ID: Ashlee Lopez, female    DOB: 1933-07-07, 86 y.o.   MRN: [id number]  HPI  This visit occurred during the SARS-CoV-2 public health emergency.  Safety protocols were in place, including screening questions prior to the visit, additional usage of staff PPE, and extensive cleaning of exam room while observing appropriate contact time as indicated for disinfecting solutions.   Ashlee Lopez is a 86 year old female with a history of anxiety, Grave's disease, hypothyroidism, recurrent UTI, hyperlipidemia, GAD who presents today for follow up of hypertension.  She was last evaluated on 12/23/19 for her annual follow up, BP was noted to be above goal on two prior occasions. She was not checking BP at home so we asked for her to start.  Since her last visit she's been checking her blood pressure at home for the last 24 hours which is ranging 150-170's/low 100's. She is managed on HCTZ 25 mg, losartan 100 mg, metoprolol tartrate 50 mg BID. She doesn't believe she had her blood pressure medication today.   She denies headaches, dizziness, blurred vision. She endorses stress and daily worry, has been anxious for most of her life. She was once managed on amlodipine 10 mg but this was removed due to ankle edema. Today she endorses that her edema wasn't bothersome.   BP Readings from Last 3 Encounters:  01/05/20 (!) 148/92  12/23/19 (!) 146/82  11/26/19 (!) 165/86     Review of Systems  Constitutional: Negative for fatigue.  Respiratory: Negative for shortness of breath.   Cardiovascular: Negative for chest pain and leg swelling.  Neurological: Negative for dizziness and headaches.       Past Medical History:  Diagnosis Date  . Benign essential HTN 03/21/2011  . Breast cancer, stage 1 ([place]) 02/10/2003   Right tubular breast cancer  . Cancer ([place])   . Diverticulosis of colon 03/23/2011  . Family history of breast cancer   . Family history of colon cancer   . Family history  of melanoma   . Family history of prostate cancer   . Fibrocystic disease of breast 03/23/2011  . Graves' disease with exophthalmos 03/23/2011  . Hypertension   . Hypothyroid 03/21/2011  . IBS (irritable bowel syndrome) 03/23/2011  . ITP (idiopathic thrombocytopenic purpura) 03/21/2011  . S/P splenectomy 03/21/2011  . 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 Varicose vein of leg 03/23/2011     Social History   Socioeconomic History  . Marital status: Widowed    Spouse name: Not on file  . Number of children: Not on file  . Years of education: Not on file  . Highest education level: Not on file  Occupational History  . Not on file  Tobacco Use  . Smoking status: Never Smoker  . Smokeless tobacco: Never Used  Vaping Use  . Vaping Use: Never used  Substance and Sexual Activity  . Alcohol use: No  . Drug use: No  . Sexual activity: Never    Birth control/protection: Surgical  Other Topics Concern  . Not on file  Social History Narrative   Widow. Lives alone.    3 children, 5 grandchildren.   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   Enjoys reading, watching TV.   Social Determinants of Health   Financial Resource Strain: Low Risk   . Difficulty of Paying Living Expenses: Not hard at all  Food Insecurity: No Food Insecurity  . Worried About  Running Out of Food in the Last Year: Never true  . Ran Out of Food in the Last Year: Never true  Transportation Needs: No Transportation Needs  . Lack of Transportation (Medical): No  . Lack of Transportation (Non-Medical): No  Physical Activity: Inactive  . Days of Exercise per Week: 0 days  . Minutes of Exercise per Session: 0 min  Stress: Stress Concern Present  . Feeling of Stress : To some extent  Social Connections:   . Frequency of Communication with Friends and Family: Not on file  . Frequency of Social Gatherings with Friends and Family: Not on file  . Attends Religious  Services: Not on file  . Active Member of Clubs or Organizations: Not on file  . Attends Archivist Meetings: Not on file  . Marital Status: Not on file  Intimate Partner Violence: Not At Risk  . Fear of Current or Ex-Partner: No  . Emotionally Abused: No  . Physically Abused: No  . Sexually Abused: No    Past Surgical History:  Procedure Laterality Date  . ABDOMINAL HYSTERECTOMY  2001  . APPENDECTOMY  1941  . BREAST SURGERY  02/17/2003   Mastectomy-Right  . MASTECTOMY  2004   Dr Margot Chimes  . OOPHORECTOMY     BSO  . SKIN CANCER EXCISION  2019   removal of cancer from ear  . SPLENECTOMY  1986    Family History  Problem Relation Age of Onset  . Breast cancer Mother        Age 64  . Hypertension Father   . Heart disease Father   . Lung cancer Father   . Breast cancer Sister        Age 103  . Melanoma Sister        dx in her 44s  . Dementia Sister   . Heart disease Maternal Aunt   . Prostate cancer Maternal Uncle   . Colon cancer Maternal Grandfather   . Colon cancer Maternal Uncle   . Prostate cancer Maternal Uncle   . Dementia Maternal Aunt   . Breast cancer Cousin        maternal 2nd cousin  . Breast cancer Cousin        maternal first cousin dx in her 90s  . Melanoma Son        dx in his 62s    Allergies  Allergen Reactions  . Sulfamethoxazole-Trimethoprim Hives  . Septra [Bactrim]     Current Outpatient Medications on File Prior to Visit  Medication Sig Dispense Refill  . amLODipine (NORVASC) 10 MG tablet Take 10 mg by mouth daily.    Marland Kitchen aspirin 81 MG tablet Take 81 mg by mouth daily.      . Calcium Carbonate-Vitamin D 600-200 MG-UNIT TABS Take by mouth.    Marland Kitchen CALCIUM-VITAMIN D PO Take by mouth.      . conjugated estrogens (PREMARIN) vaginal cream Apply 0.5mg  (pea-sized amount)  just inside the vaginal introitus with a finger-tip on  Monday, Wednesday and Friday nights. 30 g 12  . CRANBERRY PO Take 3 tablets by mouth daily.    .  Diphenhydramine-APAP, sleep, (TYLENOL PM EXTRA STRENGTH PO) Take by mouth.      . hydrochlorothiazide (HYDRODIURIL) 25 MG tablet TAKE 1 TABLET BY MOUTH ONCE DAILY FOR BLOOD PRESSURE 90 tablet 1  . levothyroxine (SYNTHROID) 100 MCG tablet TAKE 1 TAB BY MOUTH ONCE DAILY. TAKE ON AN EMPTY STOMACH WITH A GLASS OF WATER ATLEAST 30-60 MINUTES  BEFORE BREAKFAST 90 tablet 1  . losartan (COZAAR) 100 MG tablet Take 1 tablet (100 mg total) by mouth daily. For high blood pressure 30 tablet 0  . metoprolol tartrate (LOPRESSOR) 50 MG tablet TAKE 1 TABLET BY MOUTH TWICE A DAY 180 tablet 1  . nitrofurantoin, macrocrystal-monohydrate, (MACROBID) 100 MG capsule Take 1 capsule (100 mg total) by mouth daily. 30 capsule 5  . omeprazole (PRILOSEC) 20 MG capsule TAKE 1 CAPSULE BY MOUTH ONCE DAILY FOR HEARTBURN 90 capsule 1  . rosuvastatin (CRESTOR) 5 MG tablet TAKE 1 TABLET BY MOUTH EVERY OTHER DAY 90 tablet 0   No current facility-administered medications on file prior to visit.    BP (!) 148/92   Pulse 67   Temp (!) 97.2 F (36.2 C) (Temporal)   Ht 5\' 4"  (1.626 m)   Wt 148 lb (67.1 kg)   SpO2 96%   BMI 25.40 kg/m    Objective:   Physical Exam Cardiovascular:     Rate and Rhythm: Normal rate and regular rhythm.  Pulmonary:     Effort: Pulmonary effort is normal.     Breath sounds: Normal breath sounds.  Musculoskeletal:     Cervical back: Neck supple.  Skin:    General: Skin is warm and dry.            Assessment & Plan:

## 2020-01-05 NOTE — Patient Instructions (Addendum)
Resume Amlodipine 10 mg daily for blood pressure.  For Today:  Take 1/2 tablet by mouth now, then 1/2 tablet around dinner time if your blood pressure isn't reducing.  Moving forward:   Take 1/2 tablet of your amlodipine 10 mg once daily for blood pressure. Continue losartan, hydrochlorothiazide, and metoprolol tartrate.   We will see you next week!

## 2020-01-13 ENCOUNTER — Other Ambulatory Visit: Payer: Self-pay

## 2020-01-13 ENCOUNTER — Encounter: Payer: Self-pay | Admitting: Primary Care

## 2020-01-13 ENCOUNTER — Ambulatory Visit (INDEPENDENT_AMBULATORY_CARE_PROVIDER_SITE_OTHER): Payer: Medicare Other | Admitting: Primary Care

## 2020-01-13 VITALS — BP 148/82 | HR 74 | Temp 98.4°F | Ht 64.0 in | Wt 148.0 lb

## 2020-01-13 DIAGNOSIS — F411 Generalized anxiety disorder: Secondary | ICD-10-CM | POA: Diagnosis not present

## 2020-01-13 DIAGNOSIS — I10 Essential (primary) hypertension: Secondary | ICD-10-CM

## 2020-01-13 DIAGNOSIS — E039 Hypothyroidism, unspecified: Secondary | ICD-10-CM

## 2020-01-13 MED ORDER — LOSARTAN POTASSIUM 100 MG PO TABS: 100.0000 mg | ORAL_TABLET | Freq: Every day | ORAL | 3 refills | Status: DC

## 2020-01-13 MED ORDER — VENLAFAXINE HCL ER 37.5 MG PO CP24: 37.5000 mg | ORAL_CAPSULE | Freq: Every day | ORAL | 1 refills | Status: DC

## 2020-01-13 NOTE — Assessment & Plan Note (Signed)
Chronic history since childhood. Citalopram was ineffective, unsure why she stopped Zoloft  Discussed that Xanax is not an option for daily anxiety symptoms.  Trial of venlafaxine ER 37.5 mg sent to pharmacy. We discussed possible side effects of headache, GI upset, drowsiness, and SI/HI. If thoughts of SI/HI develop, we discussed to present to the emergency immediately. Patient verbalized understanding.   Follow up in 6 weeks for re-evaluation.

## 2020-01-13 NOTE — Assessment & Plan Note (Signed)
Improved, not yet at goal. Increase amlodipine back to 10 mg.  Continue losartan 100 mg, metoprolol tartrate 50 mg BID, HCTZ 25 mg.   BMP pending. She will monitor BP and update if she sees readings that are consistently at or above 140/90.

## 2020-01-13 NOTE — Patient Instructions (Addendum)
Resume your amlodipine 10 mg once daily for blood pressure. Take the whole pill rather than half.  Continue taking losartan 100 mg daily, hydrochlorothiazide 25 mg daily, and metoprolol tartrate 50 mg twice daily for blood pressure.  Start venlafaxine ER 37.5 mg each morning with breakfast for anxiety.   Check your blood pressure twice weekly. It should run less than 140/90.  Please schedule a follow up visit for 6 weeks for follow up of anxiety/depression.  It was a pleasure to see you today!

## 2020-01-13 NOTE — Addendum Note (Signed)
Addended by: Cloyd Stagers on: 01/13/2020 11:29 AM   Modules accepted: Orders

## 2020-01-13 NOTE — Progress Notes (Signed)
Subjective:    Patient ID: Ashlee Lopez, female    DOB: 11/02/1933, 86 y.o.   MRN: [id number]  HPI  This visit occurred during the SARS-CoV-2 public health emergency.  Safety protocols were in place, including screening questions prior to the visit, additional usage of staff PPE, and extensive cleaning of exam room while observing appropriate contact time as indicated for disinfecting solutions.   Ashlee Lopez is a 86 year old female with a history of hypertension, chronic anxiety, hypothyroidism, ITP who presents today for follow up of hypertension.  She was last evaluated a few weeks ago for hypertension. She was having some very high readings in the 170-200/100 range while managed on losartan 100 mg, HCTZ 25 mg, and metoprolol 50 mg BID. We also discussed her chronic anxiety and how that could be playing a role in her elevated blood pressure readings. Given her high readings we added back Amlodipine 5 mg. Amlodipine 10 mg was removed during a prior visit due to reports of ankle edema.  Since her last visit she's been checking her blood pressure at home and is getting readings of 140's-150's/80's-100's. She is compliant to losartan 100 mg daily, metoprolol 50 mg BID, HCTZ 25 mg, and amlodipine 5 mg.   She continues to experience a lot of stress, worries about her sister and other family members. Chronic anxiety since her childhood. She's tried citalopram 10-20 mg in the past, reports today that this medication was ineffective. She was once trialed on Zoloft, doesn't remember why she stopped.   She would like to try some treatment for her anxiety.   She is also due for repeat TSH as her last TSH was 6. She is taking her levothyroxine every morning on an empty stomach with water only. She doesn't eat or take other medications for 30 minutes. She takes her PPI at lunch.    BP Readings from Last 3 Encounters:  01/13/20 (!) 148/82  01/05/20 (!) 148/92  12/23/19 (!) 146/82     Review of  Systems  Eyes: Negative for visual disturbance.  Respiratory: Negative for shortness of breath.   Cardiovascular: Negative for chest pain.  Neurological: Negative for dizziness and headaches.  Psychiatric/Behavioral: The patient is nervous/anxious.        Past Medical History:  Diagnosis Date  . Benign essential HTN 03/21/2011  . Breast cancer, stage 1 ([place]) 02/10/2003   Right tubular breast cancer  . Cancer ([place])   . Diverticulosis of colon 03/23/2011  . Family history of breast cancer   . Family history of colon cancer   . Family history of melanoma   . Family history of prostate cancer   . Fibrocystic disease of breast 03/23/2011  . Graves' disease with exophthalmos 03/23/2011  . Hypertension   . Hypothyroid 03/21/2011  . IBS (irritable bowel syndrome) 03/23/2011  . ITP (idiopathic thrombocytopenic purpura) 03/21/2011  . S/P splenectomy 03/21/2011  . Uterus cancer ([place]) 03/23/1999   Well differentiated AdenoCA of endometrium-superficially confined  . Varicose vein of leg 03/23/2011     Social History   Socioeconomic History  . Marital status: Widowed    Spouse name: Not on file  . Number of children: Not on file  . Years of education: Not on file  . Highest education level: Not on file  Occupational History  . Not on file  Tobacco Use  . Smoking status: Never Smoker  . Smokeless tobacco: Never Used  Vaping Use  . Vaping Use: Never used  Substance  and Sexual Activity  . Alcohol use: No  . Drug use: No  . Sexual activity: Never    Birth control/protection: Surgical  Other Topics Concern  . Not on file  Social History Narrative   Widow. Lives alone.    3 children, 5 grandchildren.   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   Enjoys reading, watching TV.   Social Determinants of Health   Financial Resource Strain: Low Risk   . Difficulty of Paying Living Expenses: Not hard at all  Food Insecurity: No Food Insecurity  . Worried About Charity fundraiser in the Last Year: Never  true  . Ran Out of Food in the Last Year: Never true  Transportation Needs: No Transportation Needs  . Lack of Transportation (Medical): No  . Lack of Transportation (Non-Medical): No  Physical Activity: Inactive  . Days of Exercise per Week: 0 days  . Minutes of Exercise per Session: 0 min  Stress: Stress Concern Present  . Feeling of Stress : To some extent  Social Connections:   . Frequency of Communication with Friends and Family: Not on file  . Frequency of Social Gatherings with Friends and Family: Not on file  . Attends Religious Services: Not on file  . Active Member of Clubs or Organizations: Not on file  . Attends Archivist Meetings: Not on file  . Marital Status: Not on file  Intimate Partner Violence: Not At Risk  . Fear of Current or Ex-Partner: No  . Emotionally Abused: No  . Physically Abused: No  . Sexually Abused: No    Past Surgical History:  Procedure Laterality Date  . ABDOMINAL HYSTERECTOMY  2001  . APPENDECTOMY  1941  . BREAST SURGERY  02/17/2003   Mastectomy-Right  . MASTECTOMY  2004   Dr Margot Chimes  . OOPHORECTOMY     BSO  . SKIN CANCER EXCISION  2019   removal of cancer from ear  . SPLENECTOMY  1986    Family History  Problem Relation Age of Onset  . Breast cancer Mother        Age 44  . Hypertension Father   . Heart disease Father   . Lung cancer Father   . Breast cancer Sister        Age 92  . Melanoma Sister        dx in her 82s  . Dementia Sister   . Heart disease Maternal Aunt   . Prostate cancer Maternal Uncle   . Colon cancer Maternal Grandfather   . Colon cancer Maternal Uncle   . Prostate cancer Maternal Uncle   . Dementia Maternal Aunt   . Breast cancer Cousin        maternal 2nd cousin  . Breast cancer Cousin        maternal first cousin dx in her 31s  . Melanoma Son        dx in his 14s    Allergies  Allergen Reactions  . Sulfamethoxazole-Trimethoprim Hives  . Septra [Bactrim]     Current Outpatient  Medications on File Prior to Visit  Medication Sig Dispense Refill  . amLODipine (NORVASC) 10 MG tablet Take 10 mg by mouth daily.    Marland Kitchen aspirin 81 MG tablet Take 81 mg by mouth daily.      . Calcium Carbonate-Vitamin D 600-200 MG-UNIT TABS Take by mouth.    Marland Kitchen CALCIUM-VITAMIN D PO Take by mouth.      . conjugated estrogens (PREMARIN) vaginal cream Apply  0.5mg  (pea-sized amount)  just inside the vaginal introitus with a finger-tip on  Monday, Wednesday and Friday nights. 30 g 12  . CRANBERRY PO Take 3 tablets by mouth daily.    . Diphenhydramine-APAP, sleep, (TYLENOL PM EXTRA STRENGTH PO) Take by mouth.      . hydrochlorothiazide (HYDRODIURIL) 25 MG tablet TAKE 1 TABLET BY MOUTH ONCE DAILY FOR BLOOD PRESSURE 90 tablet 1  . levothyroxine (SYNTHROID) 100 MCG tablet TAKE 1 TAB BY MOUTH ONCE DAILY. TAKE ON AN EMPTY STOMACH WITH A GLASS OF WATER ATLEAST 30-60 MINUTES BEFORE BREAKFAST 90 tablet 1  . metoprolol tartrate (LOPRESSOR) 50 MG tablet TAKE 1 TABLET BY MOUTH TWICE A DAY 180 tablet 1  . nitrofurantoin, macrocrystal-monohydrate, (MACROBID) 100 MG capsule Take 1 capsule (100 mg total) by mouth daily. 30 capsule 5  . omeprazole (PRILOSEC) 20 MG capsule TAKE 1 CAPSULE BY MOUTH ONCE DAILY FOR HEARTBURN 90 capsule 1  . rosuvastatin (CRESTOR) 5 MG tablet TAKE 1 TABLET BY MOUTH EVERY OTHER DAY 90 tablet 0   No current facility-administered medications on file prior to visit.    BP (!) 148/82   Pulse 74   Temp 98.4 F (36.9 C) (Temporal)   Ht 5\' 4"  (1.626 m)   Wt 148 lb (67.1 kg)   SpO2 98%   BMI 25.40 kg/m    Objective:   Physical Exam Cardiovascular:     Rate and Rhythm: Normal rate and regular rhythm.  Pulmonary:     Effort: Pulmonary effort is normal.     Breath sounds: Normal breath sounds.  Musculoskeletal:     Cervical back: Neck supple.  Skin:    General: Skin is warm and dry.            Assessment & Plan:

## 2020-01-13 NOTE — Assessment & Plan Note (Signed)
She is taking levothyroxine correctly. Repeat TSH pending.

## 2020-01-14 ENCOUNTER — Other Ambulatory Visit: Payer: Self-pay | Admitting: Primary Care

## 2020-01-14 DIAGNOSIS — I10 Essential (primary) hypertension: Secondary | ICD-10-CM

## 2020-01-14 LAB — BASIC METABOLIC PANEL
BUN/Creatinine Ratio: 19 (ref 12–28)
BUN: 12 mg/dL (ref 8–27)
CO2: 25 mmol/L (ref 20–29)
Calcium: 9.7 mg/dL (ref 8.7–10.3)
Chloride: 88 mmol/L — ABNORMAL LOW (ref 96–106)
Creatinine, Ser: 0.64 mg/dL (ref 0.57–1.00)
GFR calc Af Amer: 95 mL/min/{1.73_m2} (ref >59)
GFR calc non Af Amer: 82 mL/min/{1.73_m2} (ref >59)
Glucose: 106 mg/dL — ABNORMAL HIGH (ref 65–99)
Potassium: 3.8 mmol/L (ref 3.5–5.2)
Sodium: 126 mmol/L — ABNORMAL LOW (ref 134–144)

## 2020-01-14 LAB — TSH: TSH: 4.34 u[IU]/mL (ref 0.450–4.500)

## 2020-01-14 LAB — CBC
Hematocrit: 37 % (ref 34.0–46.6)
Hemoglobin: 12.9 g/dL (ref 11.1–15.9)
MCH: 32.6 pg (ref 26.6–33.0)
MCHC: 34.9 g/dL (ref 31.5–35.7)
MCV: 93 fL (ref 79–97)
Platelets: 279 10*3/uL (ref 150–450)
RBC: 3.96 x10E6/uL (ref 3.77–5.28)
RDW: 12.9 % (ref 11.7–15.4)
WBC: 5.9 10*3/uL (ref 3.4–10.8)

## 2020-01-15 ENCOUNTER — Other Ambulatory Visit: Payer: Self-pay

## 2020-01-15 DIAGNOSIS — E871 Hypo-osmolality and hyponatremia: Secondary | ICD-10-CM

## 2020-01-18 DIAGNOSIS — S022XXA Fracture of nasal bones, initial encounter for closed fracture: Secondary | ICD-10-CM

## 2020-01-18 HISTORY — DX: Fracture of nasal bones, initial encounter for closed fracture: S02.2XXA

## 2020-01-21 ENCOUNTER — Other Ambulatory Visit: Payer: Medicare Other

## 2020-01-21 ENCOUNTER — Other Ambulatory Visit: Payer: Self-pay

## 2020-01-21 DIAGNOSIS — E871 Hypo-osmolality and hyponatremia: Secondary | ICD-10-CM | POA: Diagnosis not present

## 2020-01-21 NOTE — Addendum Note (Signed)
Addended by: Ellamae Sia on: 01/21/2020 10:20 AM   Modules accepted: Orders

## 2020-01-22 LAB — BASIC METABOLIC PANEL
BUN/Creatinine Ratio: 18 (ref 12–28)
BUN: 11 mg/dL (ref 8–27)
CO2: 26 mmol/L (ref 20–29)
Calcium: 9.4 mg/dL (ref 8.7–10.3)
Chloride: 85 mmol/L — ABNORMAL LOW (ref 96–106)
Creatinine, Ser: 0.62 mg/dL (ref 0.57–1.00)
GFR calc Af Amer: 96 mL/min/{1.73_m2} (ref >59)
GFR calc non Af Amer: 83 mL/min/{1.73_m2} (ref >59)
Glucose: 77 mg/dL (ref 65–99)
Potassium: 3.3 mmol/L — ABNORMAL LOW (ref 3.5–5.2)
Sodium: 125 mmol/L — ABNORMAL LOW (ref 134–144)

## 2020-02-02 ENCOUNTER — Emergency Department (HOSPITAL_COMMUNITY): Payer: Medicare Other

## 2020-02-02 ENCOUNTER — Other Ambulatory Visit: Payer: Self-pay

## 2020-02-02 ENCOUNTER — Emergency Department (HOSPITAL_COMMUNITY)
Admission: EM | Admit: 2020-02-02 | Discharge: 2020-02-02 | Disposition: A | Discharge status: Home / Self Care | Payer: Medicare Other | Attending: Emergency Medicine | Admitting: Emergency Medicine

## 2020-02-02 DIAGNOSIS — Z79899 Other long term (current) drug therapy: Secondary | ICD-10-CM | POA: Insufficient documentation

## 2020-02-02 DIAGNOSIS — I517 Cardiomegaly: Secondary | ICD-10-CM | POA: Diagnosis not present

## 2020-02-02 DIAGNOSIS — S022XXA Fracture of nasal bones, initial encounter for closed fracture: Secondary | ICD-10-CM | POA: Diagnosis not present

## 2020-02-02 DIAGNOSIS — I10 Essential (primary) hypertension: Secondary | ICD-10-CM | POA: Diagnosis not present

## 2020-02-02 DIAGNOSIS — R21 Rash and other nonspecific skin eruption: Secondary | ICD-10-CM | POA: Diagnosis not present

## 2020-02-02 DIAGNOSIS — W19XXXA Unspecified fall, initial encounter: Secondary | ICD-10-CM

## 2020-02-02 DIAGNOSIS — E039 Hypothyroidism, unspecified: Secondary | ICD-10-CM | POA: Diagnosis not present

## 2020-02-02 DIAGNOSIS — T148XXA Other injury of unspecified body region, initial encounter: Secondary | ICD-10-CM

## 2020-02-02 DIAGNOSIS — W228XXA Striking against or struck by other objects, initial encounter: Secondary | ICD-10-CM | POA: Diagnosis not present

## 2020-02-02 DIAGNOSIS — Z7982 Long term (current) use of aspirin: Secondary | ICD-10-CM | POA: Diagnosis not present

## 2020-02-02 DIAGNOSIS — S80211A Abrasion, right knee, initial encounter: Secondary | ICD-10-CM | POA: Diagnosis not present

## 2020-02-02 DIAGNOSIS — Z853 Personal history of malignant neoplasm of breast: Secondary | ICD-10-CM | POA: Diagnosis not present

## 2020-02-02 DIAGNOSIS — S0081XA Abrasion of other part of head, initial encounter: Secondary | ICD-10-CM | POA: Insufficient documentation

## 2020-02-02 DIAGNOSIS — S0990XA Unspecified injury of head, initial encounter: Secondary | ICD-10-CM | POA: Diagnosis present

## 2020-02-02 DIAGNOSIS — S80212A Abrasion, left knee, initial encounter: Secondary | ICD-10-CM | POA: Diagnosis not present

## 2020-02-02 LAB — CBC WITH DIFFERENTIAL/PLATELET
Abs Immature Granulocytes: 0.04 10*3/uL (ref 0.00–0.07)
Basophils Absolute: 0.1 10*3/uL (ref 0.0–0.1)
Basophils Relative: 0 %
Eosinophils Absolute: 0.1 10*3/uL (ref 0.0–0.5)
Eosinophils Relative: 1 %
HCT: 37.6 % (ref 36.0–46.0)
Hemoglobin: 12.9 g/dL (ref 12.0–15.0)
Immature Granulocytes: 0 %
Lymphocytes Relative: 12 %
Lymphs Abs: 1.4 10*3/uL (ref 0.7–4.0)
MCH: 32.4 pg (ref 26.0–34.0)
MCHC: 34.3 g/dL (ref 30.0–36.0)
MCV: 94.5 fL (ref 80.0–100.0)
Monocytes Absolute: 1.2 10*3/uL — ABNORMAL HIGH (ref 0.1–1.0)
Monocytes Relative: 11 %
Neutro Abs: 8.6 10*3/uL — ABNORMAL HIGH (ref 1.7–7.7)
Neutrophils Relative %: 76 %
Platelets: 296 10*3/uL (ref 150–400)
RBC: 3.98 MIL/uL (ref 3.87–5.11)
RDW: 13.6 % (ref 11.5–15.5)
WBC: 11.4 10*3/uL — ABNORMAL HIGH (ref 4.0–10.5)
nRBC: 0 % (ref 0.0–0.2)

## 2020-02-02 LAB — COMPREHENSIVE METABOLIC PANEL
ALT: 30 U/L (ref 0–44)
AST: 40 U/L (ref 15–41)
Albumin: 4.7 g/dL (ref 3.5–5.0)
Alkaline Phosphatase: 67 U/L (ref 38–126)
Anion gap: 9 (ref 5–15)
BUN: 19 mg/dL (ref 8–23)
CO2: 26 mmol/L (ref 22–32)
Calcium: 9.6 mg/dL (ref 8.9–10.3)
Chloride: 93 mmol/L — ABNORMAL LOW (ref 98–111)
Creatinine, Ser: 0.78 mg/dL (ref 0.44–1.00)
GFR, Estimated: >60 mL/min (ref >60)
Glucose, Bld: 135 mg/dL — ABNORMAL HIGH (ref 70–99)
Potassium: 3.7 mmol/L (ref 3.5–5.1)
Sodium: 128 mmol/L — ABNORMAL LOW (ref 135–145)
Total Bilirubin: 1 mg/dL (ref 0.3–1.2)
Total Protein: 8.1 g/dL (ref 6.5–8.1)

## 2020-02-02 LAB — CBG MONITORING, ED: Glucose-Capillary: 123 mg/dL — ABNORMAL HIGH (ref 70–99)

## 2020-02-02 MED ORDER — BACITRACIN-NEOMYCIN-POLYMYXIN 400-5-5000 EX OINT
TOPICAL_OINTMENT | Freq: Once | CUTANEOUS | Status: AC
  Administered 2020-02-02: 1 via TOPICAL
  Filled 2020-02-02: qty 1

## 2020-02-02 MED ORDER — SALINE SPRAY 0.65 % NA SOLN: 1.0000 | NASAL | 0 refills | Status: DC | PRN

## 2020-02-02 MED ORDER — SODIUM CHLORIDE 0.9 % IV SOLN: INTRAVENOUS | Status: DC

## 2020-02-02 NOTE — Discharge Instructions (Addendum)
As discussed, today's evaluation has been generally reassuring. Your CAT scan did not show bleeding in your brain, nor skull fracture. However, there is evidence for a nasal fracture. For the next week it is important you monitor your condition carefully, and do not hesitate to return here.  Otherwise, follow-up with our ENT colleagues in 1 week for repeat evaluation.  Please use nasal spray in both nostrils daily at least twice, and try not to sneeze or cough violently, without holding your nostrils.  These actions may minimize likelihood of recurrence of your nosebleed.  For wound care, please use topical antibiotics, applied lightly until the wounds have healed over.  Subsequently, use sunblock, and lotion to minimize scar formation.

## 2020-02-02 NOTE — ED Notes (Signed)
Will attempt temperature again later.

## 2020-02-02 NOTE — ED Triage Notes (Signed)
Pt presents from home, reports a mechanical fall at church, abrasion noted to bilateral knees and bruising/abrasions to nose and chin, witness reports possible LOC. Pt states she takes 81mg  ASA daily, GCS 15

## 2020-02-02 NOTE — ED Provider Notes (Signed)
[facility] DEPT Provider Note   CSN: [id number] Arrival date & time: 02/02/20  2059     History Chief Complaint  Patient presents with  . Fall    Ashlee Lopez is a 86 y.o. female.  HPI  Patient presents with her son after a fall at work. Patient is awake, alert, providing more details, but the son adds some. She notes that she was in her usual state of health, until just prior to ED transfer. She was at church, fell while exiting. She struck her face, landed on both knees. She has been ambulatory, has no pain in either knee. However, the patient does have pain throughout her face. No confusion, no vision changes, no neck pain, no chest pain. Reports about a loss of consciousness are mixed, with bystanders reportedly stating that the patient lost consciousness, with the patient denying this. Patient takes aspirin daily, otherwise no blood thinners.  Past Medical History:  Diagnosis Date  . Benign essential HTN 03/21/2011  . Breast cancer, stage 1 ([place]) 02/10/2003   Right tubular breast cancer  . Cancer ([place])   . Diverticulosis of colon 03/23/2011  . Family history of breast cancer   . Family history of colon cancer   . Family history of melanoma   . Family history of prostate cancer   . Fibrocystic disease of breast 03/23/2011  . Graves' disease with exophthalmos 03/23/2011  . Hypertension   . Hypothyroid 03/21/2011  . IBS (irritable bowel syndrome) 03/23/2011  . ITP (idiopathic thrombocytopenic purpura) 03/21/2011  . S/P splenectomy 03/21/2011  . 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 Varicose vein of leg 03/23/2011    Patient Active Problem List   Diagnosis Date Noted  . Medicare annual wellness visit, subsequent 12/17/2018  . Chronic cough 12/11/2017  . Recurrent UTI 04/23/2016  . Genetic testing 11/24/2015  . Generalized anxiety disorder 05/11/2015  . Hyperlipidemia 05/11/2015  .  Graves' disease with exophthalmos 03/23/2011  . Diverticulosis of colon 03/23/2011  . IBS (irritable bowel syndrome) 03/23/2011  . Varicose vein of leg 03/23/2011  . Thyrotoxicosis with diffuse goiter and without thyroid storm 03/23/2011  . Idiopathic thrombocytopenic purpura ([place]) 03/21/2011  . Benign essential HTN 03/21/2011  . Hypothyroid 03/21/2011  . History of breast cancer, invasive mammary right  12/11/2010  . Uterus cancer ([place]) 03/23/1999    Past Surgical History:  Procedure Laterality Date  . ABDOMINAL HYSTERECTOMY  2001  . APPENDECTOMY  1941  . BREAST SURGERY  02/17/2003   Mastectomy-Right  . MASTECTOMY  2004   Dr Margot Chimes  . OOPHORECTOMY     BSO  . SKIN CANCER EXCISION  2019   removal of cancer from ear  . SPLENECTOMY  1986     OB History    Gravida  3   Para  3   Term  2   Preterm  1   AB      Living  3     SAB      TAB      Ectopic      Multiple      Live Births              Family History  Problem Relation Age of Onset  . Breast cancer Mother        Age 71  . Hypertension Father   . Heart disease Father   . Lung cancer Father   . Breast cancer Sister  Age 30  . Melanoma Sister        dx in her 35s  . Dementia Sister   . Heart disease Maternal Aunt   . Prostate cancer Maternal Uncle   . Colon cancer Maternal Grandfather   . Colon cancer Maternal Uncle   . Prostate cancer Maternal Uncle   . Dementia Maternal Aunt   . Breast cancer Cousin        maternal 2nd cousin  . Breast cancer Cousin        maternal first cousin dx in her 81s  . Melanoma Son        dx in his 66s    Social History   Tobacco Use  . Smoking status: Never Smoker  . Smokeless tobacco: Never Used  Vaping Use  . Vaping Use: Never used  Substance Use Topics  . Alcohol use: No  . Drug use: No    Home Medications Prior to Admission medications   Medication Sig Start Date End Date Taking? Authorizing Provider  amLODipine (NORVASC) 10 MG tablet  Take 10 mg by mouth daily.    [provider]  aspirin 81 MG tablet Take 81 mg by mouth daily.      [provider]  Calcium Carbonate-Vitamin D 600-200 MG-UNIT TABS Take by mouth.    [provider]  CALCIUM-VITAMIN D PO Take by mouth.      [provider]  conjugated estrogens (PREMARIN) vaginal cream Apply 0.5mg  (pea-sized amount)  just inside the vaginal introitus with a finger-tip on  Monday, Wednesday and Friday nights. 10/08/19   Zara Council A, PA-C  CRANBERRY PO Take 3 tablets by mouth daily.    [provider]  Diphenhydramine-APAP, sleep, (TYLENOL PM EXTRA STRENGTH PO) Take by mouth.      [provider]  hydrochlorothiazide (HYDRODIURIL) 25 MG tablet TAKE 1 TABLET BY MOUTH ONCE DAILY FOR BLOOD PRESSURE 12/14/19   Pleas Koch, NP  levothyroxine (SYNTHROID) 100 MCG tablet TAKE 1 TAB BY MOUTH ONCE DAILY. TAKE ON AN EMPTY STOMACH WITH A GLASS OF WATER ATLEAST 30-60 MINUTES BEFORE BREAKFAST 09/03/19   Pleas Koch, NP  losartan (COZAAR) 100 MG tablet Take 1 tablet (100 mg total) by mouth daily. For high blood pressure 01/13/20   Pleas Koch, NP  metoprolol tartrate (LOPRESSOR) 50 MG tablet TAKE 1 TABLET BY MOUTH TWICE A DAY 09/15/19   Pleas Koch, NP  nitrofurantoin, macrocrystal-monohydrate, (MACROBID) 100 MG capsule Take 1 capsule (100 mg total) by mouth daily. 11/26/19 05/24/20  Debroah Loop, PA-C  omeprazole (PRILOSEC) 20 MG capsule TAKE 1 CAPSULE BY MOUTH ONCE DAILY FOR HEARTBURN 11/04/19   Pleas Koch, NP  rosuvastatin (CRESTOR) 5 MG tablet TAKE 1 TABLET BY MOUTH EVERY OTHER DAY 09/28/19   Pleas Koch, NP  venlafaxine XR (EFFEXOR-XR) 37.5 MG 24 hr capsule Take 1 capsule (37.5 mg total) by mouth daily with breakfast. For anxiety. 01/13/20   Pleas Koch, NP    Allergies    Sulfamethoxazole-trimethoprim and Septra [bactrim]  Review of Systems   Review of Systems  Constitutional:         Per HPI, otherwise negative  HENT:       Per HPI, otherwise negative  Respiratory:       Per HPI, otherwise negative  Cardiovascular:       Per HPI, otherwise negative  Gastrointestinal: Negative for vomiting.  Endocrine:       Negative aside from HPI  Genitourinary:       Neg aside from HPI   Musculoskeletal:       Per HPI, otherwise negative  Skin: Positive for wound.  Neurological: Negative for syncope.    Physical Exam Updated Vital Signs BP (!) 172/85 (BP Location: Left Arm)   Pulse 85   Resp 17   Ht 5\' 4"  (1.626 m)   Wt 63.5 kg   SpO2 98%   BMI 24.03 kg/m   Physical Exam Vitals and nursing note reviewed.  Constitutional:      General: She is not in acute distress.    Appearance: She is well-developed.  HENT:     Head: Normocephalic.      Left Ear: Tympanic membrane normal.     Nose:     Comments: Left nostril with visible dried blood internal, no active bleeding.    Mouth/Throat:   Eyes:     Extraocular Movements: Extraocular movements intact.     Conjunctiva/sclera: Conjunctivae normal.     Pupils: Pupils are equal, round, and reactive to light.     Comments: No restriction of range of motion of the eyes bilaterally.  Cardiovascular:     Rate and Rhythm: Normal rate and regular rhythm.  Pulmonary:     Effort: Pulmonary effort is normal. No respiratory distress.     Breath sounds: Normal breath sounds. No stridor.  Abdominal:     General: There is no distension.  Musculoskeletal:     Cervical back: Full passive range of motion without pain and neck supple. No spinous process tenderness or muscular tenderness.     Comments: Musculoskeletal exam unremarkable. Patient flexes and extends both hips, knees, without pain, limitation. There are superficial abrasions on both knees, inferior to the patella, but no pain.  Skin:    General: Skin is warm and dry.  Neurological:     Mental Status: She is alert and oriented to person, place, and time.      Cranial Nerves: No cranial nerve deficit or dysarthria.     Motor: No tremor or abnormal muscle tone.     Coordination: Coordination normal.      ED Results / Procedures / Treatments   Labs (all labs ordered are listed, but only abnormal results are displayed) Labs Reviewed  COMPREHENSIVE METABOLIC PANEL - Abnormal; Notable for the following components:      Result Value   Sodium 128 (*)    Chloride 93 (*)    Glucose, Bld 135 (*)    All other components within normal limits  CBC WITH DIFFERENTIAL/PLATELET - Abnormal; Notable for the following components:   WBC 11.4 (*)    Neutro Abs 8.6 (*)    Monocytes Absolute 1.2 (*)    All other components within normal limits  CBG MONITORING, ED - Abnormal; Notable for the following components:   Glucose-Capillary 123 (*)    All other components within normal limits    EKG EKG Interpretation  Date/Time:  Tuesday February 02 2020 21:27:11 EST Ventricular Rate:  78 PR Interval:    QRS Duration: 91 QT Interval:  384 QTC Calculation: 438 R Axis:   69 Text Interpretation: Sinus rhythm Borderline ST depression, diffuse leads Artifact Baseline wander Abnormal ECG Confirmed by Carmin Muskrat [phone]) on 02/02/2020 9:30:31 PM  On cardiac monitor patient has sinus rhythm, 80, unremarkable Pulse oximetry 99% room air normal   Radiology CT HEAD WO CONTRAST  Result Date: 02/02/2020 CLINICAL DATA:  Fall EXAM: CT HEAD WITHOUT CONTRAST TECHNIQUE: Contiguous  axial images were obtained from the base of the skull through the vertex without intravenous contrast. COMPARISON:  None. FINDINGS: Brain: No evidence of acute territorial infarction, hemorrhage, hydrocephalus,extra-axial collection or mass lesion/mass effect. There is dilatation the ventricles and sulci consistent with age-related atrophy. Low-attenuation changes in the deep white matter consistent with small vessel ischemia. Vascular: No hyperdense vessel or unexpected calcification. Skull:  The skull is intact. No fracture or focal lesion identified. Sinuses/Orbits: The visualized paranasal sinuses and mastoid air cells are clear. The orbits and globes intact. Other: None Face: Osseous: There is a nondisplaced inferior left nasal bone fracture. Overlying nasal bridge soft tissue swelling is seen. There is a small probable anterior septal hematoma present. No other osseous fracture is identified. Orbits: No fracture identified. Unremarkable appearance of globes and orbits. Sinuses: The visualized paranasal sinuses and mastoid air cells are unremarkable. Soft tissues:  No acute findings. Limited intracranial: No acute findings. IMPRESSION: No acute intracranial abnormality. Findings consistent with age related atrophy and chronic small vessel ischemia Nondisplaced inferior left nasal bone fracture with overlying nasal bridge soft tissue swelling and probable small septal hematoma. Electronically Signed   By: Prudencio Pair M.D.   On: 02/02/2020 22:31   DG Chest Port 1 View  Result Date: 02/02/2020 CLINICAL DATA:  86 year old female with fall. EXAM: PORTABLE CHEST 1 VIEW COMPARISON:  None. FINDINGS: No focal consolidation, pleural effusion, pneumothorax. There is cardiomegaly. Atherosclerotic calcification of the aorta. No acute osseous pathology. IMPRESSION: 1. No acute cardiopulmonary process. 2. Cardiomegaly. Electronically Signed   By: Anner Crete M.D.   On: 02/02/2020 21:47   CT Maxillofacial Wo Contrast  Result Date: 02/02/2020 CLINICAL DATA:  Fall EXAM: CT HEAD WITHOUT CONTRAST TECHNIQUE: Contiguous axial images were obtained from the base of the skull through the vertex without intravenous contrast. COMPARISON:  None. FINDINGS: Brain: No evidence of acute territorial infarction, hemorrhage, hydrocephalus,extra-axial collection or mass lesion/mass effect. There is dilatation the ventricles and sulci consistent with age-related atrophy. Low-attenuation changes in the deep white matter  consistent with small vessel ischemia. Vascular: No hyperdense vessel or unexpected calcification. Skull: The skull is intact. No fracture or focal lesion identified. Sinuses/Orbits: The visualized paranasal sinuses and mastoid air cells are clear. The orbits and globes intact. Other: None Face: Osseous: There is a nondisplaced inferior left nasal bone fracture. Overlying nasal bridge soft tissue swelling is seen. There is a small probable anterior septal hematoma present. No other osseous fracture is identified. Orbits: No fracture identified. Unremarkable appearance of globes and orbits. Sinuses: The visualized paranasal sinuses and mastoid air cells are unremarkable. Soft tissues:  No acute findings. Limited intracranial: No acute findings. IMPRESSION: No acute intracranial abnormality. Findings consistent with age related atrophy and chronic small vessel ischemia Nondisplaced inferior left nasal bone fracture with overlying nasal bridge soft tissue swelling and probable small septal hematoma. Electronically Signed   By: Prudencio Pair M.D.   On: 02/02/2020 22:31    Procedures Procedures (including critical care time)  Medications Ordered in ED Medications  0.9 %  sodium chloride infusion ( Intravenous New Bag/Given (Non-Interop) 02/02/20 2200)  neomycin-bacitracin-polymyxin (NEOSPORIN) ointment packet (has no administration in time range)    ED Course  I have reviewed the triage vital signs and the nursing notes.  Pertinent labs & imaging results that were available during my care of the patient were reviewed by me and considered in my medical decision making (see chart for details).    11:00 PM Patient in no distress,  awake, alert. Now, on repeat evaluation, we discussed CT results, which I have reviewed, agree with interpretation. On reviewing the patient's facial wounds, no active bleeding anywhere. She does have one 1 cm lesion on the inside of her upper lip that is hemostatic. Otherwise  wounds are reassuring, none requiring suture repair. We discussed wound care at home, necessity of following up for her nasal bone fracture, reassuring labs given her history of hyponatremia, which she corroborates. Patient discharged in stable condition with her son.  MDM Rules/Calculators/A&P MDM Number of Diagnoses or Management Options Abrasion: new, needed workup Closed fracture of nasal bone, initial encounter: new, needed workup Fall, initial encounter: new, needed workup   Amount and/or Complexity of Data Reviewed Clinical lab tests: reviewed Tests in the radiology section of CPT: reviewed Tests in the medicine section of CPT: reviewed Decide to obtain previous medical records or to obtain history from someone other than the patient: yes Obtain history from someone other than the patient: yes Independent visualization of images, tracings, or specimens: yes  Risk of Complications, Morbidity, and/or Mortality Presenting problems: high Diagnostic procedures: high Management options: high  Critical Care Total time providing critical care: < 30 minutes  Patient Progress Patient progress: stable  Final Clinical Impression(s) / ED Diagnoses Final diagnoses:  Fall, initial encounter  Abrasion  Closed fracture of nasal bone, initial encounter    Rx / DC Orders ED Discharge Orders         Ordered    sodium chloride (OCEAN) 0.65 % SOLN nasal spray  As needed        02/02/20 2256           Carmin Muskrat, MD 02/02/20 2301

## 2020-02-05 ENCOUNTER — Encounter: Payer: Self-pay | Admitting: Primary Care

## 2020-02-05 ENCOUNTER — Ambulatory Visit (INDEPENDENT_AMBULATORY_CARE_PROVIDER_SITE_OTHER): Payer: Medicare Other | Admitting: Primary Care

## 2020-02-05 ENCOUNTER — Other Ambulatory Visit: Payer: Self-pay

## 2020-02-05 VITALS — BP 138/88 | HR 79 | Temp 97.0°F | Ht 64.0 in | Wt 147.0 lb

## 2020-02-05 DIAGNOSIS — M25619 Stiffness of unspecified shoulder, not elsewhere classified: Secondary | ICD-10-CM

## 2020-02-05 DIAGNOSIS — Z23 Encounter for immunization: Secondary | ICD-10-CM | POA: Diagnosis not present

## 2020-02-05 DIAGNOSIS — I10 Essential (primary) hypertension: Secondary | ICD-10-CM | POA: Diagnosis not present

## 2020-02-05 DIAGNOSIS — R2689 Other abnormalities of gait and mobility: Secondary | ICD-10-CM | POA: Diagnosis not present

## 2020-02-05 DIAGNOSIS — R296 Repeated falls: Secondary | ICD-10-CM | POA: Diagnosis not present

## 2020-02-05 DIAGNOSIS — E871 Hypo-osmolality and hyponatremia: Secondary | ICD-10-CM

## 2020-02-05 LAB — BASIC METABOLIC PANEL
BUN: 12 mg/dL (ref 6–23)
CO2: 29 mEq/L (ref 19–32)
Calcium: 9.5 mg/dL (ref 8.4–10.5)
Chloride: 94 mEq/L — ABNORMAL LOW (ref 96–112)
Creatinine, Ser: 0.62 mg/dL (ref 0.40–1.20)
GFR: 81.77 mL/min (ref >60.00)
Glucose, Bld: 104 mg/dL — ABNORMAL HIGH (ref 70–99)
Potassium: 3.8 mEq/L (ref 3.5–5.1)
Sodium: 131 mEq/L — ABNORMAL LOW (ref 135–145)

## 2020-02-05 NOTE — Assessment & Plan Note (Signed)
Recent fall involving nondisplaced nasal bone fracture.  Referral placed for outpatient physical therapy to work on balance and strength to prevent falls.

## 2020-02-05 NOTE — Assessment & Plan Note (Signed)
Chronic bilaterally, which impedes balance at times.  Referral placed to physical therapy for evaluation.

## 2020-02-05 NOTE — Progress Notes (Signed)
Subjective:    Patient ID: Ashlee Lopez, female    DOB: [birth year], 86 y.o.   MRN: [id number]  HPI  This visit occurred during the SARS-CoV-2 public health emergency.  Safety protocols were in place, including screening questions prior to the visit, additional usage of staff PPE, and extensive cleaning of exam room while observing appropriate contact time as indicated for disinfecting solutions.   Ashlee Lopez is a 86 year old female with a history of hypertension, hypothyroidism, GAD, uterine cancer, hyperlipidemia who presents today for follow up of hypertension.  She is currently prescribed amlodipine 10 mg, losartan 100 mg, metoprolol  tartrate 50 mg twice daily. During her last visit we discontinued HCTZ given severe hyponatremia that was thought to be the cause, sodium level of 125 on 01/21/20.   She presented to [facility] on [facility]/02/20 due to facial pain after a fall when losing balance. Labs completed which showed sodium level of 128. CT maxillofacial with nondisplaced inferior left nasal bone fracture. CT head negative for bleed. She endorses a long history of balance issues which has lead to falling. She uses her cane most of the time.   Today she endorses that she stopped her HCTZ a few weeks ago when instructed. She is compliant to her venlafaxine XR 37.5 mg which was recently started during her last visit, hasn't noticed an improvement in anxiety as of yet. She is working to schedule an appointment with ENT.   BP Readings from Last 3 Encounters:  02/05/20 138/88  02/02/20 (!) 158/76  01/13/20 (!) 148/82      Review of Systems  Respiratory: Negative for shortness of breath.   Cardiovascular: Negative for chest pain.  Musculoskeletal: Positive for arthralgias.  Skin:       Bruising since fall on 02/02/20  Neurological: Negative for dizziness.     Past Medical History:  Diagnosis Date   Benign essential HTN 03/21/2011   Breast cancer, stage 1 ([place]) 02/10/2003   Right tubular  breast cancer   Cancer ([place])    Diverticulosis of colon 03/23/2011   Family history of breast cancer    Family history of colon cancer    Family history of melanoma    Family history of prostate cancer    Fibrocystic disease of breast 03/23/2011   Graves' disease with exophthalmos 03/23/2011   Hypertension    Hypothyroid 03/21/2011   IBS (irritable bowel syndrome) 03/23/2011   ITP (idiopathic thrombocytopenic purpura) 03/21/2011   Nasal fracture 01/2020   S/P splenectomy 03/21/2011   Uterus cancer ([place]) 03/23/1999   Well differentiated AdenoCA of endometrium-superficially confined   Varicose vein of leg 03/23/2011     Social History   Socioeconomic History   Marital status: Widowed    Spouse name: Not on file   Number of children: Not on file   Years of education: Not on file   Highest education level: Not on file  Occupational History   Not on file  Tobacco Use   Smoking status: Never Smoker   Smokeless tobacco: Never Used  Vaping Use   Vaping Use: Never used  Substance and Sexual Activity   Alcohol use: No   Drug use: No   Sexual activity: Never    Birth control/protection: Surgical  Other Topics Concern   Not on file  Social History Narrative   Widow. Lives alone.    3 children, 5 grandchildren.   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   Enjoys reading, watching TV.   Social Determinants  of Health   Financial Resource Strain: Low Risk    Difficulty of Paying Living Expenses: Not hard at all  Food Insecurity: No Food Insecurity   Worried About [place] in the Last Year: Never true   [place] in the Last Year: Never true  Transportation Needs: No Transportation Needs   Lack of Transportation (Medical): No   Lack of Transportation (Non-Medical): No  Physical Activity: Inactive   Days of Exercise per Week: 0 days   Minutes of Exercise per Session: 0 min  Stress: Stress Concern Present   Feeling of Stress : To some extent   Social Connections:    Frequency of Communication with Friends and Family: Not on file   Frequency of Social Gatherings with Friends and Family: Not on file   Attends Religious Services: Not on file   Active Member of Clubs or Organizations: Not on file   Attends Archivist Meetings: Not on file   Marital Status: Not on file  Intimate Partner Violence: Not At Risk   Fear of Current or Ex-Partner: No   Emotionally Abused: No   Physically Abused: No   Sexually Abused: No    Past Surgical History:  Procedure Laterality Date   ABDOMINAL HYSTERECTOMY  2001   APPENDECTOMY  1941   BREAST SURGERY  02/17/2003   Mastectomy-Right   MASTECTOMY  2004   Dr Margot Chimes   OOPHORECTOMY     BSO   SKIN CANCER EXCISION  2019   removal of cancer from ear   [place]    Family History  Problem Relation Age of Onset   Breast cancer Mother        Age 103   Hypertension Father    Heart disease Father    Lung cancer Father    Breast cancer Sister        Age 50   Melanoma Sister        dx in her 53s   Dementia Sister    Heart disease Maternal Aunt    Prostate cancer Maternal Uncle    Colon cancer Maternal Grandfather    Colon cancer Maternal Uncle    Prostate cancer Maternal Uncle    Dementia Maternal Aunt    Breast cancer Cousin        maternal 2nd cousin   Breast cancer Cousin        maternal first cousin dx in her 24s   Melanoma Son        dx in his 94s    Allergies  Allergen Reactions   Sulfamethoxazole-Trimethoprim Hives   Septra [Bactrim]     Current Outpatient Medications on File Prior to Visit  Medication Sig Dispense Refill   amLODipine (NORVASC) 10 MG tablet Take 10 mg by mouth daily.     aspirin 81 MG tablet Take 81 mg by mouth daily.       Calcium Carbonate-Vitamin D 600-200 MG-UNIT TABS Take by mouth.     CALCIUM-VITAMIN D PO Take by mouth.       conjugated estrogens (PREMARIN) vaginal cream Apply 0.5mg   (pea-sized amount)  just inside the vaginal introitus with a finger-tip on  Monday, Wednesday and Friday nights. 30 g 12   CRANBERRY PO Take 3 tablets by mouth daily.     Diphenhydramine-APAP, sleep, (TYLENOL PM EXTRA STRENGTH PO) Take by mouth.       hydrochlorothiazide (HYDRODIURIL) 25 MG tablet TAKE 1 TABLET BY MOUTH ONCE DAILY FOR BLOOD  PRESSURE 90 tablet 1   levothyroxine (SYNTHROID) 100 MCG tablet TAKE 1 TAB BY MOUTH ONCE DAILY. TAKE ON AN EMPTY STOMACH WITH A GLASS OF WATER ATLEAST 30-60 MINUTES BEFORE BREAKFAST 90 tablet 1   losartan (COZAAR) 100 MG tablet Take 1 tablet (100 mg total) by mouth daily. For high blood pressure 90 tablet 3   metoprolol tartrate (LOPRESSOR) 50 MG tablet TAKE 1 TABLET BY MOUTH TWICE A DAY 180 tablet 1   nitrofurantoin, macrocrystal-monohydrate, (MACROBID) 100 MG capsule Take 1 capsule (100 mg total) by mouth daily. 30 capsule 5   omeprazole (PRILOSEC) 20 MG capsule TAKE 1 CAPSULE BY MOUTH ONCE DAILY FOR HEARTBURN 90 capsule 1   rosuvastatin (CRESTOR) 5 MG tablet TAKE 1 TABLET BY MOUTH EVERY OTHER DAY 90 tablet 0   sodium chloride (OCEAN) 0.65 % SOLN nasal spray Place 1 spray into both nostrils as needed for congestion. 30 mL 0   venlafaxine XR (EFFEXOR-XR) 37.5 MG 24 hr capsule Take 1 capsule (37.5 mg total) by mouth daily with breakfast. For anxiety. 30 capsule 1   No current facility-administered medications on file prior to visit.    BP 138/88    Pulse 79    Temp (!) 97 F (36.1 C) (Temporal)    Ht 5\' 4"  (1.626 m)    Wt 147 lb (66.7 kg)    SpO2 98%    BMI 25.23 kg/m    Objective:   Physical Exam Cardiovascular:     Rate and Rhythm: Normal rate and regular rhythm.  Pulmonary:     Effort: Pulmonary effort is normal.     Breath sounds: Normal breath sounds.  Skin:    General: Skin is warm and dry.     Comments: Bruising around eyes, nasal bridge, chin, purple/bluish color.   Neurological:     Mental Status: She is alert and oriented to  person, place, and time.            Assessment & Plan:

## 2020-02-05 NOTE — Assessment & Plan Note (Signed)
Initially high, improved on recheck. Remain off of hydrochlorothiazide given her history of hyponatremia.  Continue losartan 100 mg, amlodipine 10 mg, metoprolol tartrate 50 mg twice daily.  Repeat BMP pending.  If sodium levels remain low despite removal of hydrochlorothiazide, then need to consider hyponatremia work-up.

## 2020-02-05 NOTE — Patient Instructions (Addendum)
Continue to remain off of hydrochlorothiazide blood pressure.  Stop by the lab prior to leaving today. I will notify you of your results once received.   You will be contacted regarding your referral to physical therapy to help with balance.  Please let us know if you have not been contacted within two weeks.   It was a pleasure to see you today!

## 2020-02-05 NOTE — Assessment & Plan Note (Signed)
Chronic and presumed to be secondary to thiazide diuretic.  She has now been off of hydrochlorothiazide for the last 2+ weeks.  Repeat BMP pending today.  If hyponatremia remains despite removal of thiazide diuretic, then consider hyponatremia work-up.

## 2020-02-08 ENCOUNTER — Encounter (INDEPENDENT_AMBULATORY_CARE_PROVIDER_SITE_OTHER): Payer: Self-pay | Admitting: Otolaryngology

## 2020-02-08 ENCOUNTER — Ambulatory Visit (INDEPENDENT_AMBULATORY_CARE_PROVIDER_SITE_OTHER): Payer: Medicare Other | Admitting: Otolaryngology

## 2020-02-08 ENCOUNTER — Other Ambulatory Visit: Payer: Self-pay

## 2020-02-08 VITALS — Temp 98.1°F

## 2020-02-08 DIAGNOSIS — S022XXA Fracture of nasal bones, initial encounter for closed fracture: Secondary | ICD-10-CM | POA: Diagnosis not present

## 2020-02-08 NOTE — Progress Notes (Signed)
HPI: Ashlee Lopez is a 86 y.o. female who presents is referred by ED for evaluation of nasal fracture.  Patient apparently fell outside of her church 6 days ago last Tuesday landing on her face.  She was seen in the ED and had facial CT scan that showed a nondisplaced left nasal bone fracture.  Questionable septal hematoma and she is referred here.  She is having no trouble breathing through her nose today..  Past Medical History:  Diagnosis Date  . Benign essential HTN 03/21/2011  . Breast cancer, stage 1 ([place]) 02/10/2003   Right tubular breast cancer  . Cancer ([place])   . Diverticulosis of colon 03/23/2011  . Family history of breast cancer   . Family history of colon cancer   . Family history of melanoma   . Family history of prostate cancer   . Fibrocystic disease of breast 03/23/2011  . Graves' disease with exophthalmos 03/23/2011  . Hypertension   . Hypothyroid 03/21/2011  . IBS (irritable bowel syndrome) 03/23/2011  . ITP (idiopathic thrombocytopenic purpura) 03/21/2011  . Nasal fracture 01/2020  . S/P splenectomy 03/21/2011  . 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 Varicose vein of leg 03/23/2011   Past Surgical History:  Procedure Laterality Date  . ABDOMINAL HYSTERECTOMY  2001  . APPENDECTOMY  1941  . BREAST SURGERY  02/17/2003   Mastectomy-Right  . MASTECTOMY  2004   Dr Margot Chimes  . OOPHORECTOMY     BSO  . SKIN CANCER EXCISION  2019   removal of cancer from ear  . SPLENECTOMY  1986   Social History   Socioeconomic History  . Marital status: Widowed    Spouse name: Not on file  . Number of children: Not on file  . Years of education: Not on file  . Highest education level: Not on file  Occupational History  . Not on file  Tobacco Use  . Smoking status: Never Smoker  . Smokeless tobacco: Never Used  Vaping Use  . Vaping Use: Never used  Substance and Sexual Activity  . Alcohol use: No  . Drug use: No  . Sexual  activity: Never    Birth control/protection: Surgical  Other Topics Concern  . Not on file  Social History Narrative   Widow. Lives alone.    3 children, 5 grandchildren.   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   Enjoys reading, watching TV.   Social Determinants of Health   Financial Resource Strain: Low Risk   . Difficulty of Paying Living Expenses: Not hard at all  Food Insecurity: No Food Insecurity  . Worried About Charity fundraiser in the Last Year: Never true  . Ran Out of Food in the Last Year: Never true  Transportation Needs: No Transportation Needs  . Lack of Transportation (Medical): No  . Lack of Transportation (Non-Medical): No  Physical Activity: Inactive  . Days of Exercise per Week: 0 days  . Minutes of Exercise per Session: 0 min  Stress: Stress Concern Present  . Feeling of Stress : To some extent  Social Connections:   . Frequency of Communication with Friends and Family: Not on file  . Frequency of Social Gatherings with Friends and Family: Not on file  . Attends Religious Services: Not on file  . Active Member of Clubs or Organizations: Not on file  . Attends Archivist Meetings: Not on file  . Marital Status: Not on file  Family History  Problem Relation Age of Onset  . Breast cancer Mother        Age 87  . Hypertension Father   . Heart disease Father   . Lung cancer Father   . Breast cancer Sister        Age 44  . Melanoma Sister        dx in her 56s  . Dementia Sister   . Heart disease Maternal Aunt   . Prostate cancer Maternal Uncle   . Colon cancer Maternal Grandfather   . Colon cancer Maternal Uncle   . Prostate cancer Maternal Uncle   . Dementia Maternal Aunt   . Breast cancer Cousin        maternal 2nd cousin  . Breast cancer Cousin        maternal first cousin dx in her 38s  . Melanoma Son        dx in his 9s   Allergies  Allergen Reactions  . Sulfamethoxazole-Trimethoprim Hives  . Septra [Bactrim]    Prior to  Admission medications   Medication Sig Start Date End Date Taking? Authorizing Provider  amLODipine (NORVASC) 10 MG tablet Take 10 mg by mouth daily.   Yes [provider]  aspirin 81 MG tablet Take 81 mg by mouth daily.     Yes [provider]  Calcium Carbonate-Vitamin D 600-200 MG-UNIT TABS Take by mouth.   Yes [provider]  CALCIUM-VITAMIN D PO Take by mouth.     Yes [provider]  conjugated estrogens (PREMARIN) vaginal cream Apply 0.5mg  (pea-sized amount)  just inside the vaginal introitus with a finger-tip on  Monday, Wednesday and Friday nights. 10/08/19  Yes McGowan, Larene Beach A, PA-C  CRANBERRY PO Take 3 tablets by mouth daily.   Yes [provider]  Diphenhydramine-APAP, sleep, (TYLENOL PM EXTRA STRENGTH PO) Take by mouth.     Yes [provider]  levothyroxine (SYNTHROID) 100 MCG tablet TAKE 1 TAB BY MOUTH ONCE DAILY. TAKE ON AN EMPTY STOMACH WITH A GLASS OF WATER ATLEAST 30-60 MINUTES BEFORE BREAKFAST 09/03/19  Yes Pleas Koch, NP  losartan (COZAAR) 100 MG tablet Take 1 tablet (100 mg total) by mouth daily. For high blood pressure 01/13/20  Yes Pleas Koch, NP  metoprolol tartrate (LOPRESSOR) 50 MG tablet TAKE 1 TABLET BY MOUTH TWICE A DAY 09/15/19  Yes Pleas Koch, NP  nitrofurantoin, macrocrystal-monohydrate, (MACROBID) 100 MG capsule Take 1 capsule (100 mg total) by mouth daily. 11/26/19 05/24/20 Yes Vaillancourt, Samantha, PA-C  omeprazole (PRILOSEC) 20 MG capsule TAKE 1 CAPSULE BY MOUTH ONCE DAILY FOR HEARTBURN 11/04/19  Yes Pleas Koch, NP  rosuvastatin (CRESTOR) 5 MG tablet TAKE 1 TABLET BY MOUTH EVERY OTHER DAY 09/28/19  Yes Pleas Koch, NP  sodium chloride (OCEAN) 0.65 % SOLN nasal spray Place 1 spray into both nostrils as needed for congestion. 02/02/20  Yes Carmin Muskrat, MD  venlafaxine XR (EFFEXOR-XR) 37.5 MG 24 hr capsule Take 1 capsule (37.5 mg total) by mouth daily with breakfast. For  anxiety. 01/13/20  Yes Pleas Koch, NP     Positive ROS: Otherwise negative  All other systems have been reviewed and were otherwise negative with the exception of those mentioned in the HPI and as above.  Physical Exam: Constitutional: Alert, well-appearing, no acute distress Ears: External ears without lesions or tenderness. Ear canals are clear bilaterally with intact, clear TMs.  Nasal: She has some abrasions of the nose.  But the nose is well aligned.  No significant displacement of the nasal bones.  He also has an abrasion of the left upper lip.  Intranasal exam reveals septum to be relatively midline with no evidence of significant septal hematoma.  Nasal passages are clear. Oral: Lips and gums without lesions. Tongue and palate mucosa without lesions. Posterior oropharynx clear.  Floor mouth is clear as is oral mucosa. Neck: No palpable adenopathy or masses.  She has ecchymosis of her neck as well as her chin and around her eyes. Respiratory: Breathing comfortably  Skin: No facial/neck lesions or rash noted.  Procedures  Assessment: Nondisplaced nasal fracture  Plan: No further therapy or intervention is needed.   Radene Journey, MD   CC:

## 2020-02-10 ENCOUNTER — Other Ambulatory Visit: Payer: Self-pay

## 2020-02-10 ENCOUNTER — Ambulatory Visit (INDEPENDENT_AMBULATORY_CARE_PROVIDER_SITE_OTHER): Payer: Medicare Other | Admitting: Urology

## 2020-02-10 VITALS — BP 148/74 | HR 83 | Ht 64.0 in | Wt 145.0 lb

## 2020-02-10 DIAGNOSIS — N3281 Overactive bladder: Secondary | ICD-10-CM | POA: Diagnosis not present

## 2020-02-10 LAB — BLADDER SCAN AMB NON-IMAGING: Scan Result: 0

## 2020-02-10 NOTE — Progress Notes (Signed)
02/10/2020 1:58 PM   Ashlee Lopez [birth year] [id number]  Referring provider: Pleas Koch, NP [place],  [place] [id number]  No chief complaint on file.   HPI:  F/u -   1) OAB symptoms,mixed incontinenceand recurrent UTI. She has a history of dysuria and mixed incontinence, but also asymptomatic bacteriuria. Low dose abx suppression was discussed but not started.She followed up with PA McGowanand had a normal pelvic exam exhibiting atrophic vaginitis. PA McGowan put her on probiotics, vitamin C and cranberry supplements as well as transvaginal estrogen. Renal ultrasound was normal March 28, 2018. PVR has been normal.   She continued to havefrequency and urgency. She wears a few ppd due to UUI. She has occasional urine odor and cloudiness. No dysuria.She started Myrbetriq 25 mg. She has nocturia. She has LE edema and hasn't been wearing pressure stocking.  Shehad + klebsiella urine cx Sep 2020. PVR normal. She had dysuria Sep 2020 which cleared with increased water intake. Cx was mixed growth. She has frequency and urgency. She stopped Myrbetriq. Sometimes urine odor and cloudiness come and go. No gross hematuria.   Solifenacin was expensive and she stopped that May 2021. She started PTNS but it didn't seem to help too much. She had e coli on cx in June, Jul and Sep 2021. She started TV estrogen Jul 2021. She is taking NF nightly. No dysuria or hematuria. She continues to have frequency, urgency and nocturia.   PVR is 0 today.    PMH: Past Medical History:  Diagnosis Date  . Benign essential HTN 03/21/2011  . Breast cancer, stage 1 ([place]) 02/10/2003   Right tubular breast cancer  . Cancer ([place])   . Diverticulosis of colon 03/23/2011  . Family history of breast cancer   . Family history of colon cancer   . Family history of melanoma   . Family history of prostate cancer   . Fibrocystic disease of breast 03/23/2011  . Graves' disease with exophthalmos  03/23/2011  . Hypertension   . Hypothyroid 03/21/2011  . IBS (irritable bowel syndrome) 03/23/2011  . ITP (idiopathic thrombocytopenic purpura) 03/21/2011  . Nasal fracture 01/2020  . S/P splenectomy 03/21/2011  . Uterus cancer ([place]) 03/23/1999   Well differentiated AdenoCA of endometrium-superficially confined  . Varicose vein of leg 03/23/2011    Surgical History: Past Surgical History:  Procedure Laterality Date  . ABDOMINAL HYSTERECTOMY  2001  . APPENDECTOMY  1941  . BREAST SURGERY  02/17/2003   Mastectomy-Right  . MASTECTOMY  2004   Dr Margot Chimes  . OOPHORECTOMY     BSO  . SKIN CANCER EXCISION  2019   removal of cancer from ear  . SPLENECTOMY  1986    Home Medications:  Allergies as of 02/10/2020      Reactions   Sulfamethoxazole-trimethoprim Hives   Septra [bactrim]       Medication List       Accurate as of February 10, 2020  1:58 PM. If you have any questions, ask your nurse or doctor.        amLODipine 10 MG tablet Commonly known as: NORVASC Take 10 mg by mouth daily.   aspirin 81 MG tablet Take 81 mg by mouth daily.   Calcium Carbonate-Vitamin D 600-200 MG-UNIT Tabs Take by mouth.   CALCIUM-VITAMIN D PO Take by mouth.   CRANBERRY PO Take 3 tablets by mouth daily.   levothyroxine 100 MCG tablet Commonly known as: SYNTHROID TAKE 1 TAB BY MOUTH  ONCE DAILY. TAKE ON AN EMPTY STOMACH WITH A GLASS OF WATER ATLEAST 30-60 MINUTES BEFORE BREAKFAST   losartan 100 MG tablet Commonly known as: Cozaar Take 1 tablet (100 mg total) by mouth daily. For high blood pressure   metoprolol tartrate 50 MG tablet Commonly known as: LOPRESSOR TAKE 1 TABLET BY MOUTH TWICE A DAY   nitrofurantoin (macrocrystal-monohydrate) 100 MG capsule Commonly known as: MACROBID Take 1 capsule (100 mg total) by mouth daily.   omeprazole 20 MG capsule Commonly known as: PRILOSEC TAKE 1 CAPSULE BY MOUTH ONCE DAILY FOR HEARTBURN   Premarin vaginal cream Generic drug: conjugated  estrogens Apply 0.5mg  (pea-sized amount)  just inside the vaginal introitus with a finger-tip on  Monday, Wednesday and Friday nights.   rosuvastatin 5 MG tablet Commonly known as: CRESTOR TAKE 1 TABLET BY MOUTH EVERY OTHER DAY   sodium chloride 0.65 % Soln nasal spray Commonly known as: OCEAN Place 1 spray into both nostrils as needed for congestion.   TYLENOL PM EXTRA STRENGTH PO Take by mouth.   venlafaxine XR 37.5 MG 24 hr capsule Commonly known as: EFFEXOR-XR Take 1 capsule (37.5 mg total) by mouth daily with breakfast. For anxiety.       Allergies:  Allergies  Allergen Reactions  . Sulfamethoxazole-Trimethoprim Hives  . Septra [Bactrim]     Family History: Family History  Problem Relation Age of Onset  . Breast cancer Mother        Age 89  . Hypertension Father   . Heart disease Father   . Lung cancer Father   . Breast cancer Sister        Age 29  . Melanoma Sister        dx in her 20s  . Dementia Sister   . Heart disease Maternal Aunt   . Prostate cancer Maternal Uncle   . Colon cancer Maternal Grandfather   . Colon cancer Maternal Uncle   . Prostate cancer Maternal Uncle   . Dementia Maternal Aunt   . Breast cancer Cousin        maternal 2nd cousin  . Breast cancer Cousin        maternal first cousin dx in her 13s  . Melanoma Son        dx in his 6s    Social History:  reports that she has never smoked. She has never used smokeless tobacco. She reports that she does not drink alcohol and does not use drugs.   Physical Exam: There were no vitals taken for this visit.  Constitutional:  Alert and oriented, No acute distress. HEENT: [place] AT, moist mucus membranes.  Trachea midline, no masses. Cardiovascular: No clubbing, cyanosis, or edema. Respiratory: Normal respiratory effort, no increased work of breathing. GI: Abdomen is soft, nontender, nondistended, no abdominal masses GU: No CVA tenderness Skin: No rashes, bruises or suspicious  lesions. Neurologic: Grossly intact, no focal deficits, moving all 4 extremities. Psychiatric: Normal mood and affect.  Laboratory Data: Lab Results  Component Value Date   WBC 11.4 (H) 02/02/2020   HGB 12.9 02/02/2020   HCT 37.6 02/02/2020   MCV 94.5 02/02/2020   PLT 296 02/02/2020    Lab Results  Component Value Date   CREATININE 0.62 02/05/2020    No results found for: PSA  No results found for: TESTOSTERONE  Lab Results  Component Value Date   HGBA1C 5.9 (H) 12/16/2019    Urinalysis    Component Value Date/Time   APPEARANCEUR Cloudy (A) 11/26/2019  1024   GLUCOSEU Negative 11/26/2019 1024   BILIRUBINUR Negative 11/26/2019 1024   PROTEINUR Negative 11/26/2019 1024   UROBILINOGEN 0.2 12/24/2017 0830   NITRITE Negative 11/26/2019 1024   LEUKOCYTESUR 3+ (A) 11/26/2019 1024    Lab Results  Component Value Date   LABMICR See below: 11/26/2019   WBCUA 11-30 (A) 11/26/2019   RBCUA 0-2 05/09/2018   LABEPIT 0-10 11/26/2019   MUCUS Present (A) 05/09/2018   BACTERIA Many (A) 11/26/2019    Pertinent Imaging: N/a  No results found for this or any previous visit.  No results found for this or any previous visit.  No results found for this or any previous visit.  No results found for this or any previous visit.  Results for orders placed during the hospital encounter of 03/28/18  US RENAL  Narrative CLINICAL DATA:  Recurrent UTI  EXAM: RENAL / URINARY TRACT ULTRASOUND COMPLETE  COMPARISON:  None  FINDINGS: Right Kidney:  Renal measurements: 9.7 x 3.5 x 5.0 cm = volume: 87.4 mL. Normal cortical thickness. Increased cortical echogenicity. No mass, hydronephrosis or shadowing calcification.  Left Kidney:  Renal measurements: 10.4 x 5.6 x 4.6 cm = volume: 132.4 mL. Normal cortical thickness. Increased cortical echogenicity. Simple appearing cyst at inferior pole 2.9 x 3.2 x 3.1 cm. Tiny cyst at medial aspect of mid upper pole 1.5 x 0.9 x 1.0 cm.  No hydronephrosis or shadowing calcification.  Bladder:  Appears normal for degree of bladder distention. BILATERAL ureteral jets seen.  IMPRESSION: Medical renal disease changes of both kidneys.  Two LEFT renal cysts.  Otherwise negative exam.   Electronically Signed By: Lavonia Dana M.D. On: 03/28/2018 15:06  No results found for this or any previous visit.  No results found for this or any previous visit.  No results found for this or any previous visit.   Assessment & Plan:    1) UTI - cut back to 50 mg po QHS or QOD then stop. At that point hopefully she'll do fine on TV estrogen as needed.   2) frequency, urgency - she continue surveillance for now       No follow-ups on file.  Festus Aloe, MD  [facility] [address] [place], [place] [address] [phone]

## 2020-02-17 ENCOUNTER — Other Ambulatory Visit: Payer: Self-pay

## 2020-02-17 ENCOUNTER — Encounter: Payer: Self-pay | Admitting: Podiatry

## 2020-02-17 ENCOUNTER — Ambulatory Visit (INDEPENDENT_AMBULATORY_CARE_PROVIDER_SITE_OTHER): Payer: Medicare Other

## 2020-02-17 ENCOUNTER — Ambulatory Visit (INDEPENDENT_AMBULATORY_CARE_PROVIDER_SITE_OTHER): Payer: Medicare Other | Admitting: Podiatry

## 2020-02-17 DIAGNOSIS — Z23 Encounter for immunization: Secondary | ICD-10-CM | POA: Diagnosis not present

## 2020-02-17 DIAGNOSIS — B351 Tinea unguium: Secondary | ICD-10-CM | POA: Diagnosis not present

## 2020-02-17 DIAGNOSIS — M79676 Pain in unspecified toe(s): Secondary | ICD-10-CM

## 2020-02-17 NOTE — Progress Notes (Signed)
She presents today chief complaint of painful elongated toenails.  Objective: Toenails are long thick yellow dystrophic-like mycotic painful.  Assessment: Pain in limb secondary to onychomycosis.  Plan: Remove toenails 1 through 5 bilateral.

## 2020-02-24 ENCOUNTER — Other Ambulatory Visit: Payer: Self-pay

## 2020-02-24 ENCOUNTER — Ambulatory Visit (INDEPENDENT_AMBULATORY_CARE_PROVIDER_SITE_OTHER): Payer: Medicare Other | Admitting: Primary Care

## 2020-02-24 ENCOUNTER — Ambulatory Visit (INDEPENDENT_AMBULATORY_CARE_PROVIDER_SITE_OTHER)
Admission: RE | Admit: 2020-02-24 | Discharge: 2020-02-24 | Disposition: A | Discharge status: Home / Self Care | Payer: Medicare Other | Source: Ambulatory Visit | Attending: Primary Care | Admitting: Primary Care

## 2020-02-24 VITALS — BP 156/82 | HR 77 | Temp 98.6°F | Ht 64.0 in | Wt 145.0 lb

## 2020-02-24 DIAGNOSIS — F411 Generalized anxiety disorder: Secondary | ICD-10-CM

## 2020-02-24 DIAGNOSIS — M7989 Other specified soft tissue disorders: Secondary | ICD-10-CM | POA: Diagnosis not present

## 2020-02-24 DIAGNOSIS — S62231A Other displaced fracture of base of first metacarpal bone, right hand, initial encounter for closed fracture: Secondary | ICD-10-CM | POA: Diagnosis not present

## 2020-02-24 DIAGNOSIS — I10 Essential (primary) hypertension: Secondary | ICD-10-CM

## 2020-02-24 DIAGNOSIS — S62521A Displaced fracture of distal phalanx of right thumb, initial encounter for closed fracture: Secondary | ICD-10-CM | POA: Diagnosis not present

## 2020-02-24 DIAGNOSIS — R296 Repeated falls: Secondary | ICD-10-CM

## 2020-02-24 LAB — BASIC METABOLIC PANEL
BUN: 14 mg/dL (ref 6–23)
CO2: 29 mEq/L (ref 19–32)
Calcium: 9.7 mg/dL (ref 8.4–10.5)
Chloride: 97 mEq/L (ref 96–112)
Creatinine, Ser: 0.69 mg/dL (ref 0.40–1.20)
GFR: 79.66 mL/min (ref >60.00)
Glucose, Bld: 92 mg/dL (ref 70–99)
Potassium: 3.9 mEq/L (ref 3.5–5.1)
Sodium: 133 mEq/L — ABNORMAL LOW (ref 135–145)

## 2020-02-24 MED ORDER — VENLAFAXINE HCL ER 37.5 MG PO CP24: 37.5000 mg | ORAL_CAPSULE | Freq: Every day | ORAL | 3 refills | Status: DC

## 2020-02-24 NOTE — Progress Notes (Addendum)
Subjective:    Patient ID: Ashlee Lopez, female    DOB: [birth year], 86 y.o.   MRN: [id number]  HPI  This visit occurred during the SARS-CoV-2 public health emergency.  Safety protocols were in place, including screening questions prior to the visit, additional usage of staff PPE, and extensive cleaning of exam room while observing appropriate contact time as indicated for disinfecting solutions.   Ashlee Lopez is a 86 year old female with a history of hypertension, hypothyroidism, uterine cancer, ITP, hyperlipidemia, GAD who presents today for follow up of hypertension and anxiety.  1) GAD: She was last evaluated six weeks ago for chronic anxiety symptoms and hypertension. She endorsed ongoing anxiety which has been present since childhood, worries about everyone in her family, also friends, frequently feels anxious. Given ongoing symptoms coupled with failed treatment previously with Zoloft and Citalopram, venlafaxine was initiated.   Since initiation of venlafaxine ER 37.5 mg she's feeling better. She is worrying less and overall feels more relaxed. She would like to continue this medication.   2) Hypertension: During her visit in October 2021 we stopped her hydrochlorothiazide given chronic hyponatremia. She continues to refrain from taking. She is compliant to her losartan 100 mg, metoprolol tartrate 50 mg BID, and amlodipine 10 mg.   Since her last visit he is checking her blood pressure twice weekly, yesterday and today her readings were 129/81 and 127/75. She denies chest pain, dizziness.   3) Thumb Pain: Acute since falling a few weeks ago. Has noticed residual pain and swelling to right thumb at the DIP joint. She can move her thumb well. Her thumb was not xray'd at the time of her fall.     BP Readings from Last 3 Encounters:  02/24/20 (!) 158/98  02/10/20 (!) 148/74  02/05/20 138/88     Review of Systems  Respiratory: Negative for shortness of breath.   Cardiovascular:  Negative for chest pain.  Musculoskeletal: Positive for arthralgias and joint swelling.  Neurological: Negative for dizziness and headaches.  Psychiatric/Behavioral:       See HPI       Past Medical History:  Diagnosis Date  . Benign essential HTN 03/21/2011  . Breast cancer, stage 1 ([place]) 02/10/2003   Right tubular breast cancer  . Cancer ([place])   . Diverticulosis of colon 03/23/2011  . Family history of breast cancer   . Family history of colon cancer   . Family history of melanoma   . Family history of prostate cancer   . Fibrocystic disease of breast 03/23/2011  . Graves' disease with exophthalmos 03/23/2011  . Hypertension   . Hypothyroid 03/21/2011  . IBS (irritable bowel syndrome) 03/23/2011  . ITP (idiopathic thrombocytopenic purpura) 03/21/2011  . Nasal fracture 01/2020  . S/P splenectomy 03/21/2011  . Uterus cancer ([place]) 03/23/1999   Well differentiated AdenoCA of endometrium-superficially confined  . Varicose vein of leg 03/23/2011     Social History   Socioeconomic History  . Marital status: Widowed    Spouse name: Not on file  . Number of children: Not on file  . Years of education: Not on file  . Highest education level: Not on file  Occupational History  . Not on file  Tobacco Use  . Smoking status: Never Smoker  . Smokeless tobacco: Never Used  Vaping Use  . Vaping Use: Never used  Substance and Sexual Activity  . Alcohol use: No  . Drug use: No  . Sexual activity: Never  Birth control/protection: Surgical  Other Topics Concern  . Not on file  Social History Narrative   Widow. Lives alone.    3 children, 5 grandchildren.   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   Enjoys reading, watching TV.   Social Determinants of Health   Financial Resource Strain: Low Risk   . Difficulty of Paying Living Expenses: Not hard at all  Food Insecurity: No Food Insecurity  . Worried About Charity fundraiser in the Last Year: Never true  . Ran Out of Food in the Last Year:  Never true  Transportation Needs: No Transportation Needs  . Lack of Transportation (Medical): No  . Lack of Transportation (Non-Medical): No  Physical Activity: Inactive  . Days of Exercise per Week: 0 days  . Minutes of Exercise per Session: 0 min  Stress: Stress Concern Present  . Feeling of Stress : To some extent  Social Connections:   . Frequency of Communication with Friends and Family: Not on file  . Frequency of Social Gatherings with Friends and Family: Not on file  . Attends Religious Services: Not on file  . Active Member of Clubs or Organizations: Not on file  . Attends Archivist Meetings: Not on file  . Marital Status: Not on file  Intimate Partner Violence: Not At Risk  . Fear of Current or Ex-Partner: No  . Emotionally Abused: No  . Physically Abused: No  . Sexually Abused: No    Past Surgical History:  Procedure Laterality Date  . ABDOMINAL HYSTERECTOMY  2001  . APPENDECTOMY  1941  . BREAST SURGERY  02/17/2003   Mastectomy-Right  . MASTECTOMY  2004   Dr Margot Chimes  . OOPHORECTOMY     BSO  . SKIN CANCER EXCISION  2019   removal of cancer from ear  . SPLENECTOMY  1986    Family History  Problem Relation Age of Onset  . Breast cancer Mother        Age 86  . Hypertension Father   . Heart disease Father   . Lung cancer Father   . Breast cancer Sister        Age 45  . Melanoma Sister        dx in her 34s  . Dementia Sister   . Heart disease Maternal Aunt   . Prostate cancer Maternal Uncle   . Colon cancer Maternal Grandfather   . Colon cancer Maternal Uncle   . Prostate cancer Maternal Uncle   . Dementia Maternal Aunt   . Breast cancer Cousin        maternal 2nd cousin  . Breast cancer Cousin        maternal first cousin dx in her 76s  . Melanoma Son        dx in his 42s    Allergies  Allergen Reactions  . Sulfamethoxazole-Trimethoprim Hives  . Septra [Bactrim]     Current Outpatient Medications on File Prior to Visit   Medication Sig Dispense Refill  . amLODipine (NORVASC) 10 MG tablet Take 10 mg by mouth daily.    Marland Kitchen aspirin 81 MG tablet Take 81 mg by mouth daily.      . Calcium Carbonate-Vitamin D 600-200 MG-UNIT TABS Take by mouth.    Marland Kitchen CALCIUM-VITAMIN D PO Take by mouth.      . conjugated estrogens (PREMARIN) vaginal cream Apply 0.5mg  (pea-sized amount)  just inside the vaginal introitus with a finger-tip on  Monday, Wednesday and Friday nights. 30 g  12  . CRANBERRY PO Take 3 tablets by mouth daily.    . Diphenhydramine-APAP, sleep, (TYLENOL PM EXTRA STRENGTH PO) Take by mouth.      . levothyroxine (SYNTHROID) 100 MCG tablet TAKE 1 TAB BY MOUTH ONCE DAILY. TAKE ON AN EMPTY STOMACH WITH A GLASS OF WATER ATLEAST 30-60 MINUTES BEFORE BREAKFAST 90 tablet 1  . losartan (COZAAR) 100 MG tablet Take 1 tablet (100 mg total) by mouth daily. For high blood pressure 90 tablet 3  . metoprolol tartrate (LOPRESSOR) 50 MG tablet TAKE 1 TABLET BY MOUTH TWICE A DAY 180 tablet 1  . nitrofurantoin, macrocrystal-monohydrate, (MACROBID) 100 MG capsule Take 1 capsule (100 mg total) by mouth daily. 30 capsule 5  . omeprazole (PRILOSEC) 20 MG capsule TAKE 1 CAPSULE BY MOUTH ONCE DAILY FOR HEARTBURN 90 capsule 1  . rosuvastatin (CRESTOR) 5 MG tablet TAKE 1 TABLET BY MOUTH EVERY OTHER DAY 90 tablet 0  . sodium chloride (OCEAN) 0.65 % SOLN nasal spray Place 1 spray into both nostrils as needed for congestion. 30 mL 0   No current facility-administered medications on file prior to visit.    BP (!) 156/82   Pulse 77   Temp 98.6 F (37 C) (Temporal)   Ht 5\' 4"  (1.626 m)   Wt 145 lb (65.8 kg)   SpO2 98%   BMI 24.89 kg/m    Objective:   Physical Exam Cardiovascular:     Rate and Rhythm: Normal rate and regular rhythm.  Pulmonary:     Effort: Pulmonary effort is normal.     Breath sounds: Normal breath sounds.  Musculoskeletal:     Cervical back: Neck supple.     Comments: Right thumb with moderate swelling at DIP joint  and to tip. Slight decrease in ROM due to swelling. No erythema.  Skin:    General: Skin is warm and dry.     Findings: No erythema.            Assessment & Plan:

## 2020-02-24 NOTE — Assessment & Plan Note (Signed)
Thumb abnormally appearing today. Xray pending.

## 2020-02-24 NOTE — Assessment & Plan Note (Signed)
Home readings much better than office readings. Continue losartan 100 mg, metoprolol tartrate 50 mg BID, and amlodipine 10 mg.   Repeat BMP pending.

## 2020-02-24 NOTE — Patient Instructions (Addendum)
Stop by the lab prior to leaving today. I will notify you of your results once received.   Continue taking losartan 100 mg, metoprolol tartrate 50 mg twice daily, amlodipine 10 mg daily for blood pressure.  I sent refills to the pharmacy of venlafaxine ER 37.5 mg for anxiety.  Please schedule a follow up appointment in 6 months.   It was a pleasure to see you today!

## 2020-02-24 NOTE — Assessment & Plan Note (Signed)
Improved with venlafaxine ER 37.5 mg, no bad side effects. Continue current regimen. Refills sent to pharmacy.

## 2020-03-01 DIAGNOSIS — M545 Low back pain, unspecified: Secondary | ICD-10-CM | POA: Diagnosis not present

## 2020-03-02 ENCOUNTER — Other Ambulatory Visit: Payer: Self-pay | Admitting: Primary Care

## 2020-03-02 DIAGNOSIS — E039 Hypothyroidism, unspecified: Secondary | ICD-10-CM

## 2020-03-09 ENCOUNTER — Other Ambulatory Visit: Payer: Self-pay | Admitting: Primary Care

## 2020-03-16 DIAGNOSIS — H2513 Age-related nuclear cataract, bilateral: Secondary | ICD-10-CM | POA: Diagnosis not present

## 2020-04-28 DIAGNOSIS — M6281 Muscle weakness (generalized): Secondary | ICD-10-CM | POA: Diagnosis not present

## 2020-04-28 DIAGNOSIS — R2681 Unsteadiness on feet: Secondary | ICD-10-CM | POA: Diagnosis not present

## 2020-04-28 DIAGNOSIS — R262 Difficulty in walking, not elsewhere classified: Secondary | ICD-10-CM | POA: Diagnosis not present

## 2020-04-28 DIAGNOSIS — R2689 Other abnormalities of gait and mobility: Secondary | ICD-10-CM | POA: Diagnosis not present

## 2020-05-05 DIAGNOSIS — R2681 Unsteadiness on feet: Secondary | ICD-10-CM | POA: Diagnosis not present

## 2020-05-05 DIAGNOSIS — R2689 Other abnormalities of gait and mobility: Secondary | ICD-10-CM | POA: Diagnosis not present

## 2020-05-05 DIAGNOSIS — M6281 Muscle weakness (generalized): Secondary | ICD-10-CM | POA: Diagnosis not present

## 2020-05-05 DIAGNOSIS — R262 Difficulty in walking, not elsewhere classified: Secondary | ICD-10-CM | POA: Diagnosis not present

## 2020-05-06 DIAGNOSIS — R262 Difficulty in walking, not elsewhere classified: Secondary | ICD-10-CM | POA: Diagnosis not present

## 2020-05-06 DIAGNOSIS — M6281 Muscle weakness (generalized): Secondary | ICD-10-CM | POA: Diagnosis not present

## 2020-05-06 DIAGNOSIS — R2681 Unsteadiness on feet: Secondary | ICD-10-CM | POA: Diagnosis not present

## 2020-05-06 DIAGNOSIS — R2689 Other abnormalities of gait and mobility: Secondary | ICD-10-CM | POA: Diagnosis not present

## 2020-05-10 DIAGNOSIS — R262 Difficulty in walking, not elsewhere classified: Secondary | ICD-10-CM | POA: Diagnosis not present

## 2020-05-10 DIAGNOSIS — R2689 Other abnormalities of gait and mobility: Secondary | ICD-10-CM | POA: Diagnosis not present

## 2020-05-10 DIAGNOSIS — M6281 Muscle weakness (generalized): Secondary | ICD-10-CM | POA: Diagnosis not present

## 2020-05-10 DIAGNOSIS — R2681 Unsteadiness on feet: Secondary | ICD-10-CM | POA: Diagnosis not present

## 2020-05-10 NOTE — Progress Notes (Signed)
05/11/2020 8:00 PM   [place] 05-06-1933 [id number]  Referring provider: Pleas Koch, NP [place],  [place] [address]  Chief Complaint  Patient presents with  . Recurrent UTI   Urological history: 1. OAB - failed Vesicare - failed PTNS  2. rUTI's - contributing factors of age, IBS, limited fluid intake and vaginal atrophy - documented positive urine cultures  E. Coli resistant to ampicillin, ciprofloxacin, levofloxacin, tetracycline and trimethoprim/sulfa on Jul 31, 2019  Pan sensitive E. Coli on September 04, 2019 treated with Keflex 500 mg, BID x 7 days  Pan sensitive E. Coli on 10/07/2019 treated with Keflex 500 mg, BID x 7 days  Pan sensitive E. Coli on 11/26/2019 treated with Ceftin 250 mg, BID x 5 days - on daily nitrofurantoin 100 mg every other day  3. Renal cysts - left simple cysts on RUS 2020    HPI: Ashlee Lopez is a 87 y.o. female who presents today for a three month follow up.  She is having baseline frequency, urgency and incontinence.  She states her urine has an odor to it and it has been dark.    She also has been experiencing nocturia x3-4.  She sometimes is quite having to have most of her fluid intake in the evening especially on days that she has outings.  She also has pedal edema that resolves in the morning.  When asked, her family members as stated she snores and her sister had sleep apnea.  Patient denies any modifying or aggravating factors.  Patient denies any gross hematuria, dysuria or suprapubic/flank pain.  Patient denies any fevers, chills, nausea or vomiting.    UA positive for nitrites, >30 WBC's and many bacteria.    PMH: Past Medical History:  Diagnosis Date  . Benign essential HTN 03/21/2011  . Breast cancer, stage 1 ([place]) 02/10/2003   Right tubular breast cancer  . Cancer ([place])   . Diverticulosis of colon 03/23/2011  . Family history of breast cancer   . Family history of colon cancer   . Family history  of melanoma   . Family history of prostate cancer   . Fibrocystic disease of breast 03/23/2011  . Graves' disease with exophthalmos 03/23/2011  . Hypertension   . Hypothyroid 03/21/2011  . IBS (irritable bowel syndrome) 03/23/2011  . ITP (idiopathic thrombocytopenic purpura) 03/21/2011  . Nasal fracture 01/2020  . S/P splenectomy 03/21/2011  . 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 Varicose vein of leg 03/23/2011    Surgical History: Past Surgical History:  Procedure Laterality Date  . ABDOMINAL HYSTERECTOMY  2001  . APPENDECTOMY  1941  . BREAST SURGERY  02/17/2003   Mastectomy-Right  . MASTECTOMY  2004   Dr Margot Chimes  . OOPHORECTOMY     BSO  . SKIN CANCER EXCISION  2019   removal of cancer from ear  . SPLENECTOMY  1986    Home Medications:  Allergies as of 05/11/2020      Reactions   Sulfamethoxazole-trimethoprim Hives   Septra [bactrim]       Medication List       Accurate as of May 11, 2020 11:59 PM. If you have any questions, ask your nurse or doctor.        STOP taking these medications   hydrochlorothiazide 25 MG tablet Commonly known as: HYDRODIURIL Stopped by:  , PA-C     TAKE these medications   amLODipine  10 MG tablet Commonly known as: NORVASC Take 10 mg by mouth daily.   aspirin 81 MG tablet Take 81 mg by mouth daily.   Calcium Carbonate-Vitamin D 600-200 MG-UNIT Tabs Take by mouth.   CALCIUM-VITAMIN D PO Take by mouth.   CRANBERRY PO Take 3 tablets by mouth daily.   levothyroxine 100 MCG tablet Commonly known as: SYNTHROID TAKE 1 TAB BY MOUTH ONCE DAILY. TAKE ON AN EMPTY STOMACH WITH A GLASS OF WATER ATLEAST 30-60 MINUTES BEFORE BREAKFAST   losartan 100 MG tablet Commonly known as: Cozaar Take 1 tablet (100 mg total) by mouth daily. For high blood pressure   metoprolol tartrate 50 MG tablet Commonly known as: LOPRESSOR TAKE 1 TABLET BY MOUTH TWICE A DAY    nitrofurantoin (macrocrystal-monohydrate) 100 MG capsule Commonly known as: MACROBID Take 1 capsule (100 mg total) by mouth daily. What changed: when to take this   omeprazole 20 MG capsule Commonly known as: PRILOSEC TAKE 1 CAPSULE BY MOUTH ONCE DAILY FOR HEARTBURN   Premarin vaginal cream Generic drug: conjugated estrogens Apply 0.5mg  (pea-sized amount)  just inside the vaginal introitus with a finger-tip on  Monday, Wednesday and Friday nights.   rosuvastatin 5 MG tablet Commonly known as: CRESTOR TAKE 1 TABLET BY MOUTH EVERY OTHER DAY   sodium chloride 0.65 % Soln nasal spray Commonly known as: OCEAN Place 1 spray into both nostrils as needed for congestion.   TYLENOL PM EXTRA STRENGTH PO Take by mouth.   venlafaxine XR 37.5 MG 24 hr capsule Commonly known as: EFFEXOR-XR Take 1 capsule (37.5 mg total) by mouth daily with breakfast. For anxiety.       Allergies:  Allergies  Allergen Reactions  . Sulfamethoxazole-Trimethoprim Hives  . Septra [Bactrim]     Family History: Family History  Problem Relation Age of Onset  . Breast cancer Mother        Age 25  . Hypertension Father   . Heart disease Father   . Lung cancer Father   . Breast cancer Sister        Age 67  . Melanoma Sister        dx in her 49s  . Dementia Sister   . Heart disease Maternal Aunt   . Prostate cancer Maternal Uncle   . Colon cancer Maternal Grandfather   . Colon cancer Maternal Uncle   . Prostate cancer Maternal Uncle   . Dementia Maternal Aunt   . Breast cancer Cousin        maternal 2nd cousin  . Breast cancer Cousin        maternal first cousin dx in her 48s  . Melanoma Son        dx in his 10s    Social History:  reports that she has never smoked. She has never used smokeless tobacco. She reports that she does not drink alcohol and does not use drugs.  ROS: Pertinent ROS in HPI  Physical Exam: BP (!) 172/89   Pulse 76   Ht 5\' 4"  (1.626 m)   Wt 146 lb (66.2 kg)    BMI 25.06 kg/m   Constitutional:  Well nourished. Alert and oriented, No acute distress. HEENT: [place] AT, mask in place.  Trachea midline Cardiovascular: No clubbing, cyanosis, or edema. Respiratory: Normal respiratory effort, no increased work of breathing. Neurologic: Grossly intact, no focal deficits, moving all 4 extremities. Psychiatric: Normal mood and affect.   Laboratory Data: Lab Results  Component Value Date   WBC  11.4 (H) 02/02/2020   HGB 12.9 02/02/2020   HCT 37.6 02/02/2020   MCV 94.5 02/02/2020   PLT 296 02/02/2020    Lab Results  Component Value Date   CREATININE 0.69 02/24/2020    Lab Results  Component Value Date   HGBA1C 5.9 (H) 12/16/2019    Lab Results  Component Value Date   TSH 4.340 01/13/2020       Component Value Date/Time   CHOL 165 12/16/2019 0818   HDL 64 12/16/2019 0818   CHOLHDL 2.6 12/16/2019 0818   LDLCALC 87 12/16/2019 0818    Lab Results  Component Value Date   AST 40 02/02/2020   Lab Results  Component Value Date   ALT 30 02/02/2020    Urinalysis Component     Latest Ref Rng & Units 05/11/2020  Specific Gravity, UA     1.005 - 1.030 1.015  pH, UA     5.0 - 7.5 8.5 (H)  Color, UA     Yellow Yellow  Appearance Ur     Clear Cloudy (A)  Leukocytes,UA     Negative 2+ (A)  Protein,UA     Negative/Trace Negative  Glucose, UA     Negative Negative  Ketones, UA     Negative Negative  RBC, UA     Negative Negative  Bilirubin, UA     Negative Negative  Urobilinogen, Ur     0.2 - 1.0 mg/dL 0.2  Nitrite, UA     Negative Positive (A)  Microscopic Examination      See below:   Component     Latest Ref Rng & Units 05/11/2020  WBC, UA     0 - 5 /hpf >30 (A)  RBC     0 - 2 /hpf 0-2  Epithelial Cells (non renal)     0 - 10 /hpf 0-10  Casts     None seen /lpf Present (A)  Cast Type     N/A Hyaline casts  Crystals     N/A Present (A)  Crystal Type     N/A Triple Phosphate  Bacteria, UA     None seen/Few Many  (A)  I have reviewed the labs.   Pertinent Imaging: No recent imaging  Assessment & Plan:    1. OAB - failed anticholinergics - failed PTNS  - most bothersome symptom nocturia  2. rUTI's - UA nitrite positive - urine sent for culture - continue Macrobid 100 mg every other day   3. Vaginal atrophy - continue vaginal estrogen cream three nights weekly   4. Nocturia -Explained how fluid taken later in the afternoon, sleep apnea and pedal edema contribute to nocturia -Advised patient to try to get most of her fluid intake earlier in the day -Recommend that sometimes during the day she spends 30 minutes with her legs elevated to reduce the pedal edema before bedtime -If she continues to have persistent nocturia despite these measures, she should speak with her PCP to see if she is at risk for sleep apnea and if so undergo a sleep study   Return for will call with UA results.  These notes generated with voice recognition software. I apologize for typographical errors.  Zara Council, PA-C  [facility] [address]  [place], [place] [address] [phone]

## 2020-05-11 ENCOUNTER — Other Ambulatory Visit: Payer: Self-pay

## 2020-05-11 ENCOUNTER — Ambulatory Visit (INDEPENDENT_AMBULATORY_CARE_PROVIDER_SITE_OTHER): Payer: Medicare Other | Admitting: Urology

## 2020-05-11 VITALS — BP 172/89 | HR 76 | Ht 64.0 in | Wt 146.0 lb

## 2020-05-11 DIAGNOSIS — N952 Postmenopausal atrophic vaginitis: Secondary | ICD-10-CM

## 2020-05-11 DIAGNOSIS — N390 Urinary tract infection, site not specified: Secondary | ICD-10-CM

## 2020-05-11 DIAGNOSIS — N3281 Overactive bladder: Secondary | ICD-10-CM | POA: Diagnosis not present

## 2020-05-11 LAB — MICROSCOPIC EXAMINATION: WBC, UA: >30 /hpf — AB (ref 0–5)

## 2020-05-11 LAB — URINALYSIS, COMPLETE
Bilirubin, UA: NEGATIVE
Glucose, UA: NEGATIVE
Ketones, UA: NEGATIVE
Nitrite, UA: POSITIVE — AB
Protein,UA: NEGATIVE
RBC, UA: NEGATIVE
Specific Gravity, UA: 1.015 (ref 1.005–1.030)
Urobilinogen, Ur: 0.2 mg/dL (ref 0.2–1.0)
pH, UA: 8.5 — ABNORMAL HIGH (ref 5.0–7.5)

## 2020-05-12 DIAGNOSIS — R2681 Unsteadiness on feet: Secondary | ICD-10-CM | POA: Diagnosis not present

## 2020-05-12 DIAGNOSIS — M6281 Muscle weakness (generalized): Secondary | ICD-10-CM | POA: Diagnosis not present

## 2020-05-12 DIAGNOSIS — R2689 Other abnormalities of gait and mobility: Secondary | ICD-10-CM | POA: Diagnosis not present

## 2020-05-12 DIAGNOSIS — R262 Difficulty in walking, not elsewhere classified: Secondary | ICD-10-CM | POA: Diagnosis not present

## 2020-05-13 DIAGNOSIS — R262 Difficulty in walking, not elsewhere classified: Secondary | ICD-10-CM | POA: Diagnosis not present

## 2020-05-13 DIAGNOSIS — M6281 Muscle weakness (generalized): Secondary | ICD-10-CM | POA: Diagnosis not present

## 2020-05-13 DIAGNOSIS — R2689 Other abnormalities of gait and mobility: Secondary | ICD-10-CM | POA: Diagnosis not present

## 2020-05-13 DIAGNOSIS — R2681 Unsteadiness on feet: Secondary | ICD-10-CM | POA: Diagnosis not present

## 2020-05-14 LAB — CULTURE, URINE COMPREHENSIVE

## 2020-05-15 ENCOUNTER — Encounter: Payer: Self-pay | Admitting: Urology

## 2020-05-16 ENCOUNTER — Telehealth: Payer: Self-pay | Admitting: Family Medicine

## 2020-05-16 NOTE — Telephone Encounter (Signed)
LMOM for patient to return call.

## 2020-05-16 NOTE — Telephone Encounter (Signed)
-----   Message from Nori Riis, PA-C sent at 05/15/2020  8:10 PM EST ----- Please let Ashlee Lopez know that her urine grew out a bacteria.  If she is not having dysuria, pain, fevers or gross hematuria, I recommend not to treat as she is likely colonized.  I would also stop her nitrofurantoin at this time as the bacteria is resistant to the antibiotic.  She needs to increase her water intake, take cranberry tablets, take Vitamin C, take D-mannose and pro-biotics.  I would like to not treat with antibiotics as her bacteria is resistant to several antibiotics.   I would like to see her in one month.

## 2020-05-17 DIAGNOSIS — R2689 Other abnormalities of gait and mobility: Secondary | ICD-10-CM | POA: Diagnosis not present

## 2020-05-17 DIAGNOSIS — R2681 Unsteadiness on feet: Secondary | ICD-10-CM | POA: Diagnosis not present

## 2020-05-17 DIAGNOSIS — R262 Difficulty in walking, not elsewhere classified: Secondary | ICD-10-CM | POA: Diagnosis not present

## 2020-05-17 DIAGNOSIS — M6281 Muscle weakness (generalized): Secondary | ICD-10-CM | POA: Diagnosis not present

## 2020-05-17 NOTE — Telephone Encounter (Signed)
Notified patient as instructed, patient pleased. Discussed follow-up appointments, patient agrees  

## 2020-05-18 ENCOUNTER — Encounter: Payer: Self-pay | Admitting: Podiatry

## 2020-05-18 ENCOUNTER — Ambulatory Visit (INDEPENDENT_AMBULATORY_CARE_PROVIDER_SITE_OTHER): Payer: Medicare Other | Admitting: Podiatry

## 2020-05-18 ENCOUNTER — Other Ambulatory Visit: Payer: Self-pay

## 2020-05-18 DIAGNOSIS — B351 Tinea unguium: Secondary | ICD-10-CM | POA: Diagnosis not present

## 2020-05-18 DIAGNOSIS — M79676 Pain in unspecified toe(s): Secondary | ICD-10-CM | POA: Diagnosis not present

## 2020-05-18 NOTE — Progress Notes (Signed)
She presents today chief complaint of painfully elongated toenails.  Objective: Toenails are long thick yellow dystrophic-like mycotic.  Assessment: Pain in setting onychomycosis.  Plan: Debridement of toenails 1 through 5 bilateral.

## 2020-05-19 DIAGNOSIS — R2681 Unsteadiness on feet: Secondary | ICD-10-CM | POA: Diagnosis not present

## 2020-05-19 DIAGNOSIS — R262 Difficulty in walking, not elsewhere classified: Secondary | ICD-10-CM | POA: Diagnosis not present

## 2020-05-19 DIAGNOSIS — M6281 Muscle weakness (generalized): Secondary | ICD-10-CM | POA: Diagnosis not present

## 2020-05-19 DIAGNOSIS — R2689 Other abnormalities of gait and mobility: Secondary | ICD-10-CM | POA: Diagnosis not present

## 2020-05-20 DIAGNOSIS — M6281 Muscle weakness (generalized): Secondary | ICD-10-CM | POA: Diagnosis not present

## 2020-05-20 DIAGNOSIS — R2681 Unsteadiness on feet: Secondary | ICD-10-CM | POA: Diagnosis not present

## 2020-05-20 DIAGNOSIS — R2689 Other abnormalities of gait and mobility: Secondary | ICD-10-CM | POA: Diagnosis not present

## 2020-05-20 DIAGNOSIS — R262 Difficulty in walking, not elsewhere classified: Secondary | ICD-10-CM | POA: Diagnosis not present

## 2020-05-24 DIAGNOSIS — R2689 Other abnormalities of gait and mobility: Secondary | ICD-10-CM | POA: Diagnosis not present

## 2020-05-24 DIAGNOSIS — R2681 Unsteadiness on feet: Secondary | ICD-10-CM | POA: Diagnosis not present

## 2020-05-24 DIAGNOSIS — R262 Difficulty in walking, not elsewhere classified: Secondary | ICD-10-CM | POA: Diagnosis not present

## 2020-05-24 DIAGNOSIS — M6281 Muscle weakness (generalized): Secondary | ICD-10-CM | POA: Diagnosis not present

## 2020-05-26 DIAGNOSIS — R262 Difficulty in walking, not elsewhere classified: Secondary | ICD-10-CM | POA: Diagnosis not present

## 2020-05-26 DIAGNOSIS — R2689 Other abnormalities of gait and mobility: Secondary | ICD-10-CM | POA: Diagnosis not present

## 2020-05-26 DIAGNOSIS — M6281 Muscle weakness (generalized): Secondary | ICD-10-CM | POA: Diagnosis not present

## 2020-05-26 DIAGNOSIS — R2681 Unsteadiness on feet: Secondary | ICD-10-CM | POA: Diagnosis not present

## 2020-06-01 DIAGNOSIS — R2681 Unsteadiness on feet: Secondary | ICD-10-CM | POA: Diagnosis not present

## 2020-06-01 DIAGNOSIS — R2689 Other abnormalities of gait and mobility: Secondary | ICD-10-CM | POA: Diagnosis not present

## 2020-06-01 DIAGNOSIS — R262 Difficulty in walking, not elsewhere classified: Secondary | ICD-10-CM | POA: Diagnosis not present

## 2020-06-01 DIAGNOSIS — M6281 Muscle weakness (generalized): Secondary | ICD-10-CM | POA: Diagnosis not present

## 2020-06-02 DIAGNOSIS — R262 Difficulty in walking, not elsewhere classified: Secondary | ICD-10-CM | POA: Diagnosis not present

## 2020-06-02 DIAGNOSIS — R2681 Unsteadiness on feet: Secondary | ICD-10-CM | POA: Diagnosis not present

## 2020-06-02 DIAGNOSIS — M6281 Muscle weakness (generalized): Secondary | ICD-10-CM | POA: Diagnosis not present

## 2020-06-02 DIAGNOSIS — R2689 Other abnormalities of gait and mobility: Secondary | ICD-10-CM | POA: Diagnosis not present

## 2020-06-06 ENCOUNTER — Other Ambulatory Visit: Payer: Self-pay | Admitting: Primary Care

## 2020-06-06 DIAGNOSIS — E785 Hyperlipidemia, unspecified: Secondary | ICD-10-CM

## 2020-06-07 DIAGNOSIS — L820 Inflamed seborrheic keratosis: Secondary | ICD-10-CM | POA: Diagnosis not present

## 2020-06-07 DIAGNOSIS — L821 Other seborrheic keratosis: Secondary | ICD-10-CM | POA: Diagnosis not present

## 2020-06-07 DIAGNOSIS — R2681 Unsteadiness on feet: Secondary | ICD-10-CM | POA: Diagnosis not present

## 2020-06-07 DIAGNOSIS — M6281 Muscle weakness (generalized): Secondary | ICD-10-CM | POA: Diagnosis not present

## 2020-06-07 DIAGNOSIS — L723 Sebaceous cyst: Secondary | ICD-10-CM | POA: Diagnosis not present

## 2020-06-07 DIAGNOSIS — L905 Scar conditions and fibrosis of skin: Secondary | ICD-10-CM | POA: Diagnosis not present

## 2020-06-07 DIAGNOSIS — D225 Melanocytic nevi of trunk: Secondary | ICD-10-CM | POA: Diagnosis not present

## 2020-06-07 DIAGNOSIS — L814 Other melanin hyperpigmentation: Secondary | ICD-10-CM | POA: Diagnosis not present

## 2020-06-07 DIAGNOSIS — R2689 Other abnormalities of gait and mobility: Secondary | ICD-10-CM | POA: Diagnosis not present

## 2020-06-07 DIAGNOSIS — R262 Difficulty in walking, not elsewhere classified: Secondary | ICD-10-CM | POA: Diagnosis not present

## 2020-06-07 DIAGNOSIS — Z85828 Personal history of other malignant neoplasm of skin: Secondary | ICD-10-CM | POA: Diagnosis not present

## 2020-06-07 DIAGNOSIS — D1801 Hemangioma of skin and subcutaneous tissue: Secondary | ICD-10-CM | POA: Diagnosis not present

## 2020-06-09 DIAGNOSIS — R2689 Other abnormalities of gait and mobility: Secondary | ICD-10-CM | POA: Diagnosis not present

## 2020-06-09 DIAGNOSIS — R262 Difficulty in walking, not elsewhere classified: Secondary | ICD-10-CM | POA: Diagnosis not present

## 2020-06-09 DIAGNOSIS — R2681 Unsteadiness on feet: Secondary | ICD-10-CM | POA: Diagnosis not present

## 2020-06-09 DIAGNOSIS — M6281 Muscle weakness (generalized): Secondary | ICD-10-CM | POA: Diagnosis not present

## 2020-06-13 ENCOUNTER — Other Ambulatory Visit: Payer: Self-pay | Admitting: Primary Care

## 2020-06-13 DIAGNOSIS — I10 Essential (primary) hypertension: Secondary | ICD-10-CM

## 2020-06-14 DIAGNOSIS — M6281 Muscle weakness (generalized): Secondary | ICD-10-CM | POA: Diagnosis not present

## 2020-06-14 DIAGNOSIS — R2681 Unsteadiness on feet: Secondary | ICD-10-CM | POA: Diagnosis not present

## 2020-06-14 DIAGNOSIS — R262 Difficulty in walking, not elsewhere classified: Secondary | ICD-10-CM | POA: Diagnosis not present

## 2020-06-14 DIAGNOSIS — R2689 Other abnormalities of gait and mobility: Secondary | ICD-10-CM | POA: Diagnosis not present

## 2020-06-16 DIAGNOSIS — R2689 Other abnormalities of gait and mobility: Secondary | ICD-10-CM | POA: Diagnosis not present

## 2020-06-16 DIAGNOSIS — R262 Difficulty in walking, not elsewhere classified: Secondary | ICD-10-CM | POA: Diagnosis not present

## 2020-06-16 DIAGNOSIS — R2681 Unsteadiness on feet: Secondary | ICD-10-CM | POA: Diagnosis not present

## 2020-06-16 DIAGNOSIS — M6281 Muscle weakness (generalized): Secondary | ICD-10-CM | POA: Diagnosis not present

## 2020-06-16 NOTE — Progress Notes (Signed)
06/17/2020 8:20 AM   Ashlee Lopez May 13, 1933 [id number]  Referring provider: Pleas Koch, NP [place],  [place] [address]  Chief Complaint  Patient presents with  . Follow-up   Urological history: 1. OAB - failed Vesicare - failed PTNS  2. rUTI's - contributing factors of age, IBS, limited fluid intake and vaginal atrophy - documented positive urine cultures  E. Coli resistant to ampicillin, ciprofloxacin, levofloxacin, tetracycline and trimethoprim/sulfa on Jul 31, 2019  Pan sensitive E. Coli on September 04, 2019 treated with Keflex 500 mg, BID x 7 days  Pan sensitive E. Coli on 10/07/2019 treated with Keflex 500 mg, BID x 7 days  Pan sensitive E. Coli on 11/26/2019 treated with Ceftin 250 mg, BID x 5 days - on daily nitrofurantoin 100 mg every other day  3. Renal cysts - left simple cysts on RUS 2020    HPI: Ashlee Lopez is a 87 y.o. female who presents today for a three month follow up.  She has no new urinary complaints.  She has baseline frequency, urgency and incontinence.  She states her urine continues to have an odor to it and is dark on occasion.  She states that she has to wear pads daily and experiences a lot of episodes of leakage.  She is no longer taking the suppressive Macrobid, but she is taking cranberry tablets, d-mannose and applying the vaginal estrogen cream.  Patient denies any modifying or aggravating factors.  Patient denies any gross hematuria, dysuria or suprapubic/flank pain.  Patient denies any fevers, chills, nausea or vomiting.   PMH: Past Medical History:  Diagnosis Date  . Benign essential HTN 03/21/2011  . Breast cancer, stage 1 ([place]) 02/10/2003   Right tubular breast cancer  . Cancer ([place])   . Diverticulosis of colon 03/23/2011  . Family history of breast cancer   . Family history of colon cancer   . Family history of melanoma   . Family history of prostate cancer   . Fibrocystic disease of breast 03/23/2011  .  Graves' disease with exophthalmos 03/23/2011  . Hypertension   . Hypothyroid 03/21/2011  . IBS (irritable bowel syndrome) 03/23/2011  . ITP (idiopathic thrombocytopenic purpura) 03/21/2011  . Nasal fracture 01/2020  . S/P splenectomy 03/21/2011  . Uterus cancer ([place]) 03/23/1999   Well differentiated AdenoCA of endometrium-superficially confined  . Varicose vein of leg 03/23/2011    Surgical History: Past Surgical History:  Procedure Laterality Date  . ABDOMINAL HYSTERECTOMY  2001  . APPENDECTOMY  1941  . BREAST SURGERY  02/17/2003   Mastectomy-Right  . MASTECTOMY  2004   Dr Margot Chimes  . OOPHORECTOMY     BSO  . SKIN CANCER EXCISION  2019   removal of cancer from ear  . SPLENECTOMY  1986    Home Medications:  Allergies as of 06/17/2020      Reactions   Sulfamethoxazole-trimethoprim Hives   Septra [bactrim]       Medication List       Accurate as of June 17, 2020 11:59 PM. If you have any questions, ask your nurse or doctor.        STOP taking these medications   sodium chloride 0.65 % Soln nasal spray Commonly known as: OCEAN Stopped by: Zara Council, PA-C     TAKE these medications   amLODipine 10 MG tablet Commonly known as: NORVASC TAKE 1 TABLET BY MOUTH DAILY FOR BLOOD PRESSURE   aspirin 81 MG tablet Take  81 mg by mouth daily.   Calcium Carbonate-Vitamin D 600-200 MG-UNIT Tabs Take by mouth.   CALCIUM-VITAMIN D PO Take by mouth.   CRANBERRY PO Take 3 tablets by mouth daily.   Gemtesa 75 MG Tabs Generic drug: Vibegron Take 75 mg by mouth daily. Started by: Zara Council, PA-C   levothyroxine 100 MCG tablet Commonly known as: SYNTHROID TAKE 1 TAB BY MOUTH ONCE DAILY. TAKE ON AN EMPTY STOMACH WITH A GLASS OF WATER ATLEAST 30-60 MINUTES BEFORE BREAKFAST   losartan 100 MG tablet Commonly known as: Cozaar Take 1 tablet (100 mg total) by mouth daily. For high blood pressure   metoprolol tartrate 50 MG tablet Commonly known as: LOPRESSOR TAKE 1 TABLET BY  MOUTH TWICE A DAY   omeprazole 20 MG capsule Commonly known as: PRILOSEC TAKE 1 CAPSULE BY MOUTH ONCE DAILY FOR HEARTBURN   Premarin vaginal cream Generic drug: conjugated estrogens Apply 0.5mg  (pea-sized amount)  just inside the vaginal introitus with a finger-tip on  Monday, Wednesday and Friday nights.   rosuvastatin 5 MG tablet Commonly known as: CRESTOR Take 1 tablet (5 mg total) by mouth every other day. For cholesterol.   TYLENOL PM EXTRA STRENGTH PO Take by mouth.   venlafaxine XR 37.5 MG 24 hr capsule Commonly known as: EFFEXOR-XR Take 1 capsule (37.5 mg total) by mouth daily with breakfast. For anxiety.       Allergies:  Allergies  Allergen Reactions  . Sulfamethoxazole-Trimethoprim Hives  . Septra [Bactrim]     Family History: Family History  Problem Relation Age of Onset  . Breast cancer Mother        Age 70  . Hypertension Father   . Heart disease Father   . Lung cancer Father   . Breast cancer Sister        Age 32  . Melanoma Sister        dx in her 84s  . Dementia Sister   . Heart disease Maternal Aunt   . Prostate cancer Maternal Uncle   . Colon cancer Maternal Grandfather   . Colon cancer Maternal Uncle   . Prostate cancer Maternal Uncle   . Dementia Maternal Aunt   . Breast cancer Cousin        maternal 2nd cousin  . Breast cancer Cousin        maternal first cousin dx in her 44s  . Melanoma Son        dx in his 24s    Social History:  reports that she has never smoked. She has never used smokeless tobacco. She reports that she does not drink alcohol and does not use drugs.  ROS: Pertinent ROS in HPI  Physical Exam: BP 138/84   Pulse 76   Ht 5\' 4"  (1.626 m)   Wt 146 lb (66.2 kg)   BMI 25.06 kg/m   Constitutional:  Well nourished. Alert and oriented, No acute distress. HEENT: [place] AT, mask in place.  Trachea midline Cardiovascular: No clubbing, cyanosis, or edema. Respiratory: Normal respiratory effort, no increased work of  breathing. Neurologic: Grossly intact, no focal deficits, moving all 4 extremities. Psychiatric: Normal mood and affect.   Laboratory Data: Lab Results  Component Value Date   WBC 11.4 (H) 02/02/2020   HGB 12.9 02/02/2020   HCT 37.6 02/02/2020   MCV 94.5 02/02/2020   PLT 296 02/02/2020    Lab Results  Component Value Date   CREATININE 0.69 02/24/2020    Lab Results  Component Value  Date   HGBA1C 5.9 (H) 12/16/2019    Lab Results  Component Value Date   TSH 4.340 01/13/2020       Component Value Date/Time   CHOL 165 12/16/2019 0818   HDL 64 12/16/2019 0818   CHOLHDL 2.6 12/16/2019 0818   LDLCALC 87 12/16/2019 0818    Lab Results  Component Value Date   AST 40 02/02/2020   Lab Results  Component Value Date   ALT 30 02/02/2020    Urinalysis Component     Latest Ref Rng & Units 05/11/2020  Specific Gravity, UA     1.005 - 1.030 1.015  pH, UA     5.0 - 7.5 8.5 (H)  Color, UA     Yellow Yellow  Appearance Ur     Clear Cloudy (A)  Leukocytes,UA     Negative 2+ (A)  Protein,UA     Negative/Trace Negative  Glucose, UA     Negative Negative  Ketones, UA     Negative Negative  RBC, UA     Negative Negative  Bilirubin, UA     Negative Negative  Urobilinogen, Ur     0.2 - 1.0 mg/dL 0.2  Nitrite, UA     Negative Positive (A)  Microscopic Examination      See below:   Component     Latest Ref Rng & Units 05/11/2020  WBC, UA     0 - 5 /hpf >30 (A)  RBC     0 - 2 /hpf 0-2  Epithelial Cells (non renal)     0 - 10 /hpf 0-10  Casts     None seen /lpf Present (A)  Cast Type     N/A Hyaline casts  Crystals     N/A Present (A)  Crystal Type     N/A Triple Phosphate  Bacteria, UA     None seen/Few Many (A)  I have reviewed the labs.   Pertinent Imaging: No recent imaging  Assessment & Plan:    1. OAB - failed anticholinergics - failed PTNS  - most bothersome symptom nocturia -Explained that increase in urinary frequency is not typically  a sign of UTI, but of OAB -We will give her samples of Gemtesa 75 mg, #28 to see if it is effective in controlling her urinary symptoms  2. rUTI's -Asymptomatic at today's visit -Discussed the difference between asymptomatic bacteriuria versus UTI  3. Vaginal atrophy - continue vaginal estrogen cream three nights weekly   4. Nocturia -has not discussed her risk for sleep apnea with PCP   Return in about 3 weeks (around 07/08/2020) for PVR and OAB questionnaire.  These notes generated with voice recognition software. I apologize for typographical errors.  Zara Council, PA-C  [facility] [address]  [place], [place] [address] [phone]

## 2020-06-17 ENCOUNTER — Encounter: Payer: Self-pay | Admitting: Urology

## 2020-06-17 ENCOUNTER — Ambulatory Visit (INDEPENDENT_AMBULATORY_CARE_PROVIDER_SITE_OTHER): Payer: Medicare Other | Admitting: Urology

## 2020-06-17 ENCOUNTER — Other Ambulatory Visit: Payer: Self-pay

## 2020-06-17 VITALS — BP 138/84 | HR 76 | Ht 64.0 in | Wt 146.0 lb

## 2020-06-17 DIAGNOSIS — N3281 Overactive bladder: Secondary | ICD-10-CM | POA: Diagnosis not present

## 2020-06-17 DIAGNOSIS — R351 Nocturia: Secondary | ICD-10-CM | POA: Diagnosis not present

## 2020-06-17 DIAGNOSIS — N952 Postmenopausal atrophic vaginitis: Secondary | ICD-10-CM

## 2020-06-17 MED ORDER — GEMTESA 75 MG PO TABS: 75.0000 mg | ORAL_TABLET | Freq: Every day | ORAL | 0 refills | Status: DC

## 2020-06-20 DIAGNOSIS — M25571 Pain in right ankle and joints of right foot: Secondary | ICD-10-CM | POA: Diagnosis not present

## 2020-06-20 DIAGNOSIS — M25512 Pain in left shoulder: Secondary | ICD-10-CM | POA: Diagnosis not present

## 2020-06-22 DIAGNOSIS — R2689 Other abnormalities of gait and mobility: Secondary | ICD-10-CM | POA: Diagnosis not present

## 2020-06-22 DIAGNOSIS — R262 Difficulty in walking, not elsewhere classified: Secondary | ICD-10-CM | POA: Diagnosis not present

## 2020-06-22 DIAGNOSIS — R2681 Unsteadiness on feet: Secondary | ICD-10-CM | POA: Diagnosis not present

## 2020-06-22 DIAGNOSIS — M6281 Muscle weakness (generalized): Secondary | ICD-10-CM | POA: Diagnosis not present

## 2020-06-29 DIAGNOSIS — Z1231 Encounter for screening mammogram for malignant neoplasm of breast: Secondary | ICD-10-CM | POA: Diagnosis not present

## 2020-06-29 DIAGNOSIS — Z803 Family history of malignant neoplasm of breast: Secondary | ICD-10-CM | POA: Diagnosis not present

## 2020-06-29 LAB — HM MAMMOGRAPHY

## 2020-07-07 ENCOUNTER — Encounter: Payer: Self-pay | Admitting: Primary Care

## 2020-07-08 ENCOUNTER — Ambulatory Visit: Payer: Self-pay | Admitting: Urology

## 2020-07-12 NOTE — Progress Notes (Addendum)
07/13/2020 9:31 PM   Ashlee Lopez 1933-12-22 [id number]  Referring provider: Pleas Koch, NP [place],  [place] [address]  Chief Complaint  Patient presents with  . Over Active Bladder   Urological history: 1. OAB - failed Vesicare - failed PTNS  2. rUTI's - contributing factors of age, IBS, limited fluid intake and vaginal atrophy - documented positive urine cultures  E. Coli resistant to ampicillin, ciprofloxacin, levofloxacin, tetracycline and trimethoprim/sulfa on Jul 31, 2019  Pan sensitive E. Coli on September 04, 2019 treated with Keflex 500 mg, BID x 7 days  Pan sensitive E. Coli on 10/07/2019 treated with Keflex 500 mg, BID x 7 days  Pan sensitive E. Coli on 11/26/2019 treated with Ceftin 250 mg, BID x 5 days - on daily nitrofurantoin 100 mg every other day  3. Renal cysts - left simple cysts on RUS 2020    HPI: Ashlee Lopez is a 87 y.o. female who presents today for a three month follow up.   She is experiencing 8 or more urinations during the day, 3 and lower urinations at night.  She has a strong urge to urinate.  She has leakage when she feels she cannot make it to the bathroom on time.  She leaks 1-2 times daily.  She always wears poise pads.  She wears 2 pads daily.  She does reduce fluid in an effort to decrease her urinary symptoms.  And she is engaged in toilet mapping.  She has failed PTNS, Vesicare and Gemtesa.  She states the best she felt is when she was on the [facility] for suppression.     She states that she occasionally gets a funny feeling while urinating and is having intermittent suprapubic pain.  Patient denies any modifying or aggravating factors.  Patient denies any gross hematuria, dysuria or suprapubic/flank pain.  Patient denies any fevers, chills, nausea or vomiting.   She is taking cranberry tablets, probiotics, d-mannose and using vaginal estrogen cream.  Her UA nitrite positive, >30WBC's and many bacteria.    Her  PVR 183 mL.   PMH: Past Medical History:  Diagnosis Date  . Benign essential HTN 03/21/2011  . Breast cancer, stage 1 ([place]) 02/10/2003   Right tubular breast cancer  . Cancer ([place])   . Diverticulosis of colon 03/23/2011  . Family history of breast cancer   . Family history of colon cancer   . Family history of melanoma   . Family history of prostate cancer   . Fibrocystic disease of breast 03/23/2011  . Graves' disease with exophthalmos 03/23/2011  . Hypertension   . Hypothyroid 03/21/2011  . IBS (irritable bowel syndrome) 03/23/2011  . ITP (idiopathic thrombocytopenic purpura) 03/21/2011  . Nasal fracture 01/2020  . S/P splenectomy 03/21/2011  . 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 Varicose vein of leg 03/23/2011    Surgical History: Past Surgical History:  Procedure Laterality Date  . ABDOMINAL HYSTERECTOMY  2001  . APPENDECTOMY  1941  . BREAST SURGERY  02/17/2003   Mastectomy-Right  . MASTECTOMY  2004   Dr Margot Chimes  . OOPHORECTOMY     BSO  . SKIN CANCER EXCISION  2019   removal of cancer from ear  . SPLENECTOMY  1986    Home Medications:  Allergies as of 07/13/2020      Reactions   Sulfamethoxazole-trimethoprim Hives   Septra [bactrim]       Medication List  Accurate as of July 13, 2020 11:59 PM. If you have any questions, ask your nurse or doctor.        amLODipine 10 MG tablet Commonly known as: NORVASC TAKE 1 TABLET BY MOUTH DAILY FOR BLOOD PRESSURE   aspirin 81 MG tablet Take 81 mg by mouth daily.   Calcium Carbonate-Vitamin D 600-200 MG-UNIT Tabs Take by mouth.   CALCIUM-VITAMIN D PO Take by mouth.   CRANBERRY PO Take 3 tablets by mouth daily.   Gemtesa 75 MG Tabs Generic drug: Vibegron Take 75 mg by mouth daily.   levothyroxine 100 MCG tablet Commonly known as: SYNTHROID TAKE 1 TAB BY MOUTH ONCE DAILY. TAKE ON AN EMPTY STOMACH WITH A GLASS OF WATER ATLEAST 30-60 MINUTES BEFORE  BREAKFAST   losartan 100 MG tablet Commonly known as: Cozaar Take 1 tablet (100 mg total) by mouth daily. For high blood pressure   metoprolol tartrate 50 MG tablet Commonly known as: LOPRESSOR TAKE 1 TABLET BY MOUTH TWICE A DAY   omeprazole 20 MG capsule Commonly known as: PRILOSEC TAKE 1 CAPSULE BY MOUTH ONCE DAILY FOR HEARTBURN   Premarin vaginal cream Generic drug: conjugated estrogens Apply 0.5mg  (pea-sized amount)  just inside the vaginal introitus with a finger-tip on  Monday, Wednesday and Friday nights.   rosuvastatin 5 MG tablet Commonly known as: CRESTOR Take 1 tablet (5 mg total) by mouth every other day. For cholesterol.   TYLENOL PM EXTRA STRENGTH PO Take by mouth.   venlafaxine XR 37.5 MG 24 hr capsule Commonly known as: EFFEXOR-XR Take 1 capsule (37.5 mg total) by mouth daily with breakfast. For anxiety.       Allergies:  Allergies  Allergen Reactions  . Sulfamethoxazole-Trimethoprim Hives  . Septra [Bactrim]     Family History: Family History  Problem Relation Age of Onset  . Breast cancer Mother        Age 13  . Hypertension Father   . Heart disease Father   . Lung cancer Father   . Breast cancer Sister        Age 51  . Melanoma Sister        dx in her 2s  . Dementia Sister   . Heart disease Maternal Aunt   . Prostate cancer Maternal Uncle   . Colon cancer Maternal Grandfather   . Colon cancer Maternal Uncle   . Prostate cancer Maternal Uncle   . Dementia Maternal Aunt   . Breast cancer Cousin        maternal 2nd cousin  . Breast cancer Cousin        maternal first cousin dx in her 45s  . Melanoma Son        dx in his 16s    Social History:  reports that she has never smoked. She has never used smokeless tobacco. She reports that she does not drink alcohol and does not use drugs.  ROS: Pertinent ROS in HPI  Physical Exam: BP (!) 158/85   Pulse 80   Ht 5\' 4"  (1.626 m)   Wt 143 lb (64.9 kg)   BMI 24.55 kg/m    Constitutional:  Well nourished. Alert and oriented, No acute distress. HEENT: [place] AT, mask in place.  Trachea midline Cardiovascular: No clubbing, cyanosis, or edema. Respiratory: Normal respiratory effort, no increased work of breathing. Neurologic: Grossly intact, no focal deficits, moving all 4 extremities. Psychiatric: Normal mood and affect.   Laboratory Data: Urinalysis Component     Latest Ref Rng &  Units 07/13/2020  Specific Gravity, UA     1.005 - 1.030 1.015  pH, UA     5.0 - 7.5 7.5  Color, UA     Yellow Yellow  Appearance Ur     Clear Cloudy (A)  Leukocytes,UA     Negative 2+ (A)  Protein,UA     Negative/Trace Negative  Glucose, UA     Negative Negative  Ketones, UA     Negative Negative  RBC, UA     Negative Negative  Bilirubin, UA     Negative Negative  Urobilinogen, Ur     0.2 - 1.0 mg/dL 0.2  Nitrite, UA     Negative Positive (A)  Microscopic Examination      See below:   Component     Latest Ref Rng & Units 07/13/2020  WBC, UA     0 - 5 /hpf >30 (A)  RBC     0 - 2 /hpf 0-2  Epithelial Cells (non renal)     0 - 10 /hpf >10 (A)  Bacteria, UA     None seen/Few Many (A)  I have reviewed the labs.   Pertinent Imaging: Results for MAREA, REASNER (MRN [id number]) as of 07/13/2020 09:59  Ref. Range 07/13/2020 09:50  Scan Result Unknown 143mL    Assessment & Plan:    1. OAB -failed anticholinergics -failed PTNS  -most bothersome symptom nocturia -We will schedule her for cystoscopy as she has failed OAB treatments is experiencing and weird sensation associated with suprapubic pain during urination for the last several months -I have explained to the patient that they will  be scheduled for a cystoscopy in our office to evaluate their bladder.  The cystoscopy consists of passing a tube with a lens up through their urethra and into their urinary bladder.   We will inject the urethra with a lidocaine gel prior to introducing the cystoscope to help with  any discomfort during the procedure.   After the procedure, they might experience blood in the urine and discomfort with urination.  This will abate after the first few voids.  I have  encouraged the patient to increase water intake  during this time.  Patient denies any allergies to lidocaine.   2. rUTI's -Asymptomatic at today's visit -Discussed the difference between asymptomatic bacteriuria versus UTI -We will send urine for culture and treat with antibiotic as we have a pending cystoscopy  3. Vaginal atrophy - continue vaginal estrogen cream three nights weekly   4. Nocturia -has not discussed her risk for sleep apnea with PCP and I have encouraged her to do so  No follow-ups on file.  These notes generated with voice recognition software. I apologize for typographical errors.  Zara Council, PA-C  [facility] [address]  [place], [place] [address] [phone]

## 2020-07-13 ENCOUNTER — Encounter: Payer: Self-pay | Admitting: Urology

## 2020-07-13 ENCOUNTER — Other Ambulatory Visit: Payer: Self-pay

## 2020-07-13 ENCOUNTER — Ambulatory Visit (INDEPENDENT_AMBULATORY_CARE_PROVIDER_SITE_OTHER): Payer: Medicare Other | Admitting: Urology

## 2020-07-13 VITALS — BP 158/85 | HR 80 | Ht 64.0 in | Wt 143.0 lb

## 2020-07-13 DIAGNOSIS — N390 Urinary tract infection, site not specified: Secondary | ICD-10-CM | POA: Diagnosis not present

## 2020-07-13 DIAGNOSIS — N952 Postmenopausal atrophic vaginitis: Secondary | ICD-10-CM | POA: Diagnosis not present

## 2020-07-13 DIAGNOSIS — N3281 Overactive bladder: Secondary | ICD-10-CM | POA: Diagnosis not present

## 2020-07-13 DIAGNOSIS — R351 Nocturia: Secondary | ICD-10-CM | POA: Diagnosis not present

## 2020-07-13 LAB — BLADDER SCAN AMB NON-IMAGING

## 2020-07-15 LAB — URINALYSIS, COMPLETE
Bilirubin, UA: NEGATIVE
Glucose, UA: NEGATIVE
Ketones, UA: NEGATIVE
Nitrite, UA: POSITIVE — AB
Protein,UA: NEGATIVE
RBC, UA: NEGATIVE
Specific Gravity, UA: 1.015 (ref 1.005–1.030)
Urobilinogen, Ur: 0.2 mg/dL (ref 0.2–1.0)
pH, UA: 7.5 (ref 5.0–7.5)

## 2020-07-15 LAB — MICROSCOPIC EXAMINATION
Epithelial Cells (non renal): >10 /hpf — AB (ref 0–10)
WBC, UA: >30 /hpf — AB (ref 0–5)

## 2020-07-17 LAB — CULTURE, URINE COMPREHENSIVE

## 2020-07-18 ENCOUNTER — Other Ambulatory Visit: Payer: Self-pay | Admitting: *Deleted

## 2020-07-18 MED ORDER — CIPROFLOXACIN HCL 250 MG PO TABS: 250.0000 mg | ORAL_TABLET | Freq: Two times a day (BID) | ORAL | 0 refills | Status: AC

## 2020-07-25 ENCOUNTER — Telehealth: Payer: Self-pay

## 2020-07-25 NOTE — Telephone Encounter (Signed)
I recommend she proceed with the second booster Covid vaccine. She's okay to get the vaccine now, especially if she's recovered from her UTI.

## 2020-07-25 NOTE — Telephone Encounter (Signed)
Called patient reviewed all information and repeated back to me. Will call if any questions.  ? ?

## 2020-07-25 NOTE — Telephone Encounter (Signed)
Patient called and stated that she has had three COVID shots and wants to know if she should get the second booster shot. Patient stated that she recently had a UTI and was treated with Cipro. She finished these last night and wants to know if she should get it now or wait? Please advise.

## 2020-07-29 ENCOUNTER — Telehealth: Payer: Self-pay | Admitting: Primary Care

## 2020-07-29 NOTE — Telephone Encounter (Signed)
Ashlee Lopez called in wanted to know about why she was billed for a tdap vaccine from 11/21 and she called her insurance and stated that it was paid on 05/07/20.   Please advise

## 2020-08-01 NOTE — Telephone Encounter (Signed)
Called and discussed with patient. I advised her to call the billing department as we do not handle the billing in our office. I let her know that they would be able to look into the reason she received a bill and give her the information as to why it wasn't covered. Patient verbalized understanding and said she would call billing.

## 2020-08-03 ENCOUNTER — Other Ambulatory Visit: Payer: Self-pay | Admitting: Urology

## 2020-08-11 ENCOUNTER — Encounter: Payer: Self-pay | Admitting: Urology

## 2020-08-11 ENCOUNTER — Other Ambulatory Visit: Payer: Self-pay

## 2020-08-11 ENCOUNTER — Ambulatory Visit (INDEPENDENT_AMBULATORY_CARE_PROVIDER_SITE_OTHER): Payer: Medicare Other | Admitting: Urology

## 2020-08-11 VITALS — BP 165/85 | HR 91 | Ht 64.0 in | Wt 143.0 lb

## 2020-08-11 DIAGNOSIS — N3281 Overactive bladder: Secondary | ICD-10-CM

## 2020-08-11 DIAGNOSIS — N390 Urinary tract infection, site not specified: Secondary | ICD-10-CM

## 2020-08-12 ENCOUNTER — Encounter: Payer: Self-pay | Admitting: Urology

## 2020-08-12 NOTE — Progress Notes (Signed)
   08/12/20  CC:  Chief Complaint  Patient presents with  . Cysto    HPI: Recurrent UTI and overactive bladder.  Refer to Shannon's note 07/13/2020  Blood pressure (!) 165/85, pulse 91, height 5\' 4"  (1.626 m), weight 143 lb (64.9 kg). NED. A&Ox3.   No respiratory distress   Abd soft, NT, ND Atrophic external genitalia with patent urethral meatus  Cystoscopy Procedure Note  Patient identification was confirmed, informed consent was obtained, and patient was prepped using Betadine solution.  Lidocaine jelly was administered per urethral meatus.    Procedure: - Flexible cystoscope introduced, without any difficulty.   - Thorough search of the bladder revealed:    normal urethral meatus    normal urothelium    no stones    no ulcers     no tumors    no urethral polyps    no trabeculation  - Ureteral orifices were normal in position and appearance.  Post-Procedure: - Patient tolerated the procedure well  Assessment/ Plan:  No mucosal abnormalities noted on cystoscopy  Her most bothersome voiding symptom is nocturia.  Larene Beach had previously discussed a sleep apnea evaluation by her PCP and this was reiterated  Follow-up visit with Larene Beach in 3 months   Abbie Sons, MD

## 2020-08-22 ENCOUNTER — Encounter: Payer: Self-pay | Admitting: Podiatry

## 2020-08-22 ENCOUNTER — Ambulatory Visit (INDEPENDENT_AMBULATORY_CARE_PROVIDER_SITE_OTHER): Payer: Medicare Other | Admitting: Podiatry

## 2020-08-22 ENCOUNTER — Other Ambulatory Visit: Payer: Self-pay

## 2020-08-22 DIAGNOSIS — B351 Tinea unguium: Secondary | ICD-10-CM

## 2020-08-22 DIAGNOSIS — M79676 Pain in unspecified toe(s): Secondary | ICD-10-CM | POA: Diagnosis not present

## 2020-08-22 NOTE — Progress Notes (Signed)
She presents today chief complaint of painfully elongated toenails.  Objective: Pulses remain palpable.  Toenails are long thick yellow dystrophic-like mycotic.  Assessment: Pain limb secondary to onychomycosis.  Plan: Debridement of toenails 1 through 5 bilateral.

## 2020-08-24 ENCOUNTER — Ambulatory Visit: Payer: Medicare Other | Admitting: Primary Care

## 2020-08-24 ENCOUNTER — Encounter: Payer: Self-pay | Admitting: Primary Care

## 2020-08-24 ENCOUNTER — Other Ambulatory Visit: Payer: Self-pay

## 2020-08-24 ENCOUNTER — Ambulatory Visit (INDEPENDENT_AMBULATORY_CARE_PROVIDER_SITE_OTHER): Payer: Medicare Other | Admitting: Primary Care

## 2020-08-24 VITALS — BP 146/74 | HR 93 | Temp 97.3°F | Ht 64.0 in | Wt 147.0 lb

## 2020-08-24 DIAGNOSIS — R296 Repeated falls: Secondary | ICD-10-CM

## 2020-08-24 DIAGNOSIS — R351 Nocturia: Secondary | ICD-10-CM | POA: Diagnosis not present

## 2020-08-24 DIAGNOSIS — E039 Hypothyroidism, unspecified: Secondary | ICD-10-CM | POA: Diagnosis not present

## 2020-08-24 DIAGNOSIS — I10 Essential (primary) hypertension: Secondary | ICD-10-CM | POA: Diagnosis not present

## 2020-08-24 DIAGNOSIS — F411 Generalized anxiety disorder: Secondary | ICD-10-CM | POA: Diagnosis not present

## 2020-08-24 DIAGNOSIS — N3281 Overactive bladder: Secondary | ICD-10-CM | POA: Insufficient documentation

## 2020-08-24 MED ORDER — VENLAFAXINE HCL ER 75 MG PO CP24: 75.0000 mg | ORAL_CAPSULE | Freq: Every day | ORAL | 1 refills | Status: DC

## 2020-08-24 NOTE — Assessment & Plan Note (Signed)
Initially improved with venlafaxine ER 37.5 mg which seems to be ineffective now. We both agree to increase her dose to ER 75 mg, new Rx sent to pharmacy.  Follow up in four months.

## 2020-08-24 NOTE — Assessment & Plan Note (Signed)
Overall stable today, continue amlodipine 10 mg, metoprolol tartrate 50 mg BID, losartan 100 mg.  BMP pending.

## 2020-08-24 NOTE — Assessment & Plan Note (Signed)
Following with Urology, no improvement despite several medications.  Agree with sleep study recommendation, patient will notify when ready.

## 2020-08-24 NOTE — Addendum Note (Signed)
Addended by: Ellamae Sia on: 08/24/2020 11:06 AM   Modules accepted: Orders

## 2020-08-24 NOTE — Assessment & Plan Note (Signed)
Continued imbalance with 1 fall since last visit. Referral placed for outpatient PT as this was very beneficial.  Continue to utilize cane.

## 2020-08-24 NOTE — Progress Notes (Signed)
Subjective:    Patient ID: Ashlee Lopez, female    DOB: 1933/12/09, 87 y.o.   MRN: [id number]  HPI  Ashlee Lopez is a very pleasant 87 y.o. female with a history of hypertension, hypothyroidism, breast cancer, GAD, hyponatremia, recurrent falls who presents today for follow up of chronic conditions.   She completed physical therapy for recurrent falls and balance and continues to notice balance issues. She would like to resume physical therapy given the benefit, she was going to [employer] [facility]. She's fallen once since our last visit, uses her cane.   She is checking her BP at home which is running 120's-140's/70's-80's. She is compliant to losartan 100 mg, metoprolol tartrate 50 mg BID, amlodipine 10 mg. She denies dizziness.   She continues to struggle with anxiety, is compliant to venlafaxine ER 37.5 for which was beneficial initially but doesn't feel it's as effective. She is questioning whether she needs a dose increase.   She is compliant to levothyroxine 100 mcg every morning on an empty stomach with water.   Following with Urology who recently completed cystoscopy which was negative. Is managed on vibegron for urinary frequency at night, this hasn't been effective. Her Urologist is recommending a sleep study for further insight into getting up numerous times nightly. She doesn't want this done now but will notify.    BP Readings from Last 3 Encounters:  08/24/20 (!) 146/74  08/11/20 (!) 165/85  07/13/20 (!) 158/85     Review of Systems  Respiratory: Negative for shortness of breath.   Cardiovascular: Negative for chest pain.  Genitourinary:       Nocturia   Psychiatric/Behavioral: The patient is nervous/anxious.          Past Medical History:  Diagnosis Date  . Benign essential HTN 03/21/2011  . Breast cancer, stage 1 ([place]) 02/10/2003   Right tubular breast cancer  . Cancer ([place])   . Diverticulosis of colon 03/23/2011  . Family history of breast cancer   . Family  history of colon cancer   . Family history of melanoma   . Family history of prostate cancer   . Fibrocystic disease of breast 03/23/2011  . Graves' disease with exophthalmos 03/23/2011  . Hypertension   . Hypothyroid 03/21/2011  . IBS (irritable bowel syndrome) 03/23/2011  . ITP (idiopathic thrombocytopenic purpura) 03/21/2011  . Nasal fracture 01/2020  . S/P splenectomy 03/21/2011  . Uterus cancer ([place]) 03/23/1999   Well differentiated AdenoCA of endometrium-superficially confined  . Varicose vein of leg 03/23/2011    Social History   Socioeconomic History  . Marital status: Widowed    Spouse name: Not on file  . Number of children: Not on file  . Years of education: Not on file  . Highest education level: Not on file  Occupational History  . Not on file  Tobacco Use  . Smoking status: Never Smoker  . Smokeless tobacco: Never Used  Vaping Use  . Vaping Use: Never used  Substance and Sexual Activity  . Alcohol use: No  . Drug use: No  . Sexual activity: Never    Birth control/protection: Surgical  Other Topics Concern  . Not on file  Social History Narrative   Widow. Lives alone.    3 children, 5 grandchildren.   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   Enjoys reading, watching TV.   Social Determinants of Health   Financial Resource Strain: Low Risk   . Difficulty of Paying Living Expenses: Not hard at  all  Food Insecurity: No Food Insecurity  . Worried About Charity fundraiser in the Last Year: Never true  . Ran Out of Food in the Last Year: Never true  Transportation Needs: No Transportation Needs  . Lack of Transportation (Medical): No  . Lack of Transportation (Non-Medical): No  Physical Activity: Inactive  . Days of Exercise per Week: 0 days  . Minutes of Exercise per Session: 0 min  Stress: Stress Concern Present  . Feeling of Stress : To some extent  Social Connections: Not on file  Intimate Partner Violence: Not At Risk  . Fear of Current or Ex-Partner: No  .  Emotionally Abused: No  . Physically Abused: No  . Sexually Abused: No    Past Surgical History:  Procedure Laterality Date  . ABDOMINAL HYSTERECTOMY  2001  . APPENDECTOMY  1941  . BREAST SURGERY  02/17/2003   Mastectomy-Right  . MASTECTOMY  2004   Dr Margot Chimes  . OOPHORECTOMY     BSO  . SKIN CANCER EXCISION  2019   removal of cancer from ear  . SPLENECTOMY  1986    Family History  Problem Relation Age of Onset  . Breast cancer Mother        Age 85  . Hypertension Father   . Heart disease Father   . Lung cancer Father   . Breast cancer Sister        Age 30  . Melanoma Sister        dx in her 19s  . Dementia Sister   . Heart disease Maternal Aunt   . Prostate cancer Maternal Uncle   . Colon cancer Maternal Grandfather   . Colon cancer Maternal Uncle   . Prostate cancer Maternal Uncle   . Dementia Maternal Aunt   . Breast cancer Cousin        maternal 2nd cousin  . Breast cancer Cousin        maternal first cousin dx in her 34s  . Melanoma Son        dx in his 59s    Allergies  Allergen Reactions  . Sulfamethoxazole-Trimethoprim Hives  . Septra [Bactrim]     Current Outpatient Medications on File Prior to Visit  Medication Sig Dispense Refill  . amLODipine (NORVASC) 10 MG tablet TAKE 1 TABLET BY MOUTH DAILY FOR BLOOD PRESSURE 90 tablet 2  . aspirin 81 MG tablet Take 81 mg by mouth daily.    . Calcium Carbonate-Vitamin D 600-200 MG-UNIT TABS Take by mouth.    Marland Kitchen CALCIUM-VITAMIN D PO Take by mouth.    . conjugated estrogens (PREMARIN) vaginal cream Apply 0.5mg  (pea-sized amount)  just inside the vaginal introitus with a finger-tip on  Monday, Wednesday and Friday nights. 30 g 12  . CRANBERRY PO Take 3 tablets by mouth daily.    . Diphenhydramine-APAP, sleep, (TYLENOL PM EXTRA STRENGTH PO) Take by mouth.    . levothyroxine (SYNTHROID) 100 MCG tablet TAKE 1 TAB BY MOUTH ONCE DAILY. TAKE ON AN EMPTY STOMACH WITH A GLASS OF WATER ATLEAST 30-60 MINUTES BEFORE BREAKFAST  90 tablet 1  . losartan (COZAAR) 100 MG tablet Take 1 tablet (100 mg total) by mouth daily. For high blood pressure 90 tablet 3  . metoprolol tartrate (LOPRESSOR) 50 MG tablet TAKE 1 TABLET BY MOUTH TWICE A DAY 180 tablet 1  . omeprazole (PRILOSEC) 20 MG capsule TAKE 1 CAPSULE BY MOUTH ONCE DAILY FOR HEARTBURN 90 capsule 1  . rosuvastatin (CRESTOR)  5 MG tablet Take 1 tablet (5 mg total) by mouth every other day. For cholesterol. 45 tablet 3  . venlafaxine XR (EFFEXOR-XR) 37.5 MG 24 hr capsule Take 1 capsule (37.5 mg total) by mouth daily with breakfast. For anxiety. 90 capsule 3  . Vibegron (GEMTESA) 75 MG TABS Take 75 mg by mouth daily. 28 tablet 0   No current facility-administered medications on file prior to visit.    Pulse 93   Temp (!) 97.3 F (36.3 C) (Temporal)   Ht 5\' 4"  (1.626 m)   Wt 147 lb (66.7 kg)   SpO2 99%   BMI 25.23 kg/m  Objective:   Physical Exam Cardiovascular:     Rate and Rhythm: Normal rate and regular rhythm.  Pulmonary:     Effort: Pulmonary effort is normal.     Breath sounds: Normal breath sounds.  Musculoskeletal:     Cervical back: Neck supple.  Skin:    General: Skin is warm and dry.           Assessment & Plan:      This visit occurred during the SARS-CoV-2 public health emergency.  Safety protocols were in place, including screening questions prior to the visit, additional usage of staff PPE, and extensive cleaning of exam room while observing appropriate contact time as indicated for disinfecting solutions.

## 2020-08-24 NOTE — Patient Instructions (Signed)
We increased the dose of your venlafaxine ER to 75 mg. I sent a new prescription to your pharmacy.  You will be contacted regarding your referral to physical therapy.  Please let us know if you have not been contacted within two weeks.   Stop by the lab prior to leaving today. I will notify you of your results once received.   Please schedule a follow up appointment in 4 months.  It was a pleasure to see you today!

## 2020-08-24 NOTE — Assessment & Plan Note (Signed)
Seems to be taking levothyroxine correctly, repeat TSH pending. Continue 100 mcg daily.

## 2020-08-25 LAB — BASIC METABOLIC PANEL
BUN/Creatinine Ratio: 25 (ref 12–28)
BUN: 18 mg/dL (ref 8–27)
CO2: 24 mmol/L (ref 20–29)
Calcium: 9.7 mg/dL (ref 8.7–10.3)
Chloride: 99 mmol/L (ref 96–106)
Creatinine, Ser: 0.73 mg/dL (ref 0.57–1.00)
Glucose: 91 mg/dL (ref 65–99)
Potassium: 4.2 mmol/L (ref 3.5–5.2)
Sodium: 139 mmol/L (ref 134–144)
eGFR: 81 mL/min/{1.73_m2} (ref >59)

## 2020-08-25 LAB — TSH: TSH: 3.1 u[IU]/mL (ref 0.450–4.500)

## 2020-08-30 ENCOUNTER — Telehealth: Payer: Self-pay | Admitting: Primary Care

## 2020-08-30 NOTE — Telephone Encounter (Signed)
Called patient let know putting results in mail today.

## 2020-08-30 NOTE — Telephone Encounter (Signed)
Mrs. Dust called in wanted to know if she can get a copy of her labs results from last week mailed to her

## 2020-09-01 DIAGNOSIS — R262 Difficulty in walking, not elsewhere classified: Secondary | ICD-10-CM | POA: Diagnosis not present

## 2020-09-01 DIAGNOSIS — R2689 Other abnormalities of gait and mobility: Secondary | ICD-10-CM | POA: Diagnosis not present

## 2020-09-01 DIAGNOSIS — R2681 Unsteadiness on feet: Secondary | ICD-10-CM | POA: Diagnosis not present

## 2020-09-01 DIAGNOSIS — M6281 Muscle weakness (generalized): Secondary | ICD-10-CM | POA: Diagnosis not present

## 2020-09-05 ENCOUNTER — Other Ambulatory Visit: Payer: Self-pay | Admitting: Primary Care

## 2020-09-05 DIAGNOSIS — I10 Essential (primary) hypertension: Secondary | ICD-10-CM

## 2020-09-14 DIAGNOSIS — R2681 Unsteadiness on feet: Secondary | ICD-10-CM | POA: Diagnosis not present

## 2020-09-14 DIAGNOSIS — R2689 Other abnormalities of gait and mobility: Secondary | ICD-10-CM | POA: Diagnosis not present

## 2020-09-14 DIAGNOSIS — R262 Difficulty in walking, not elsewhere classified: Secondary | ICD-10-CM | POA: Diagnosis not present

## 2020-09-14 DIAGNOSIS — M6281 Muscle weakness (generalized): Secondary | ICD-10-CM | POA: Diagnosis not present

## 2020-09-16 DIAGNOSIS — R2689 Other abnormalities of gait and mobility: Secondary | ICD-10-CM | POA: Diagnosis not present

## 2020-09-16 DIAGNOSIS — M6281 Muscle weakness (generalized): Secondary | ICD-10-CM | POA: Diagnosis not present

## 2020-09-16 DIAGNOSIS — R262 Difficulty in walking, not elsewhere classified: Secondary | ICD-10-CM | POA: Diagnosis not present

## 2020-09-16 DIAGNOSIS — R2681 Unsteadiness on feet: Secondary | ICD-10-CM | POA: Diagnosis not present

## 2020-09-22 DIAGNOSIS — R2689 Other abnormalities of gait and mobility: Secondary | ICD-10-CM | POA: Diagnosis not present

## 2020-09-22 DIAGNOSIS — M6281 Muscle weakness (generalized): Secondary | ICD-10-CM | POA: Diagnosis not present

## 2020-09-22 DIAGNOSIS — R262 Difficulty in walking, not elsewhere classified: Secondary | ICD-10-CM | POA: Diagnosis not present

## 2020-09-22 DIAGNOSIS — R2681 Unsteadiness on feet: Secondary | ICD-10-CM | POA: Diagnosis not present

## 2020-09-23 DIAGNOSIS — M6281 Muscle weakness (generalized): Secondary | ICD-10-CM | POA: Diagnosis not present

## 2020-09-23 DIAGNOSIS — R262 Difficulty in walking, not elsewhere classified: Secondary | ICD-10-CM | POA: Diagnosis not present

## 2020-09-23 DIAGNOSIS — R2689 Other abnormalities of gait and mobility: Secondary | ICD-10-CM | POA: Diagnosis not present

## 2020-09-23 DIAGNOSIS — R2681 Unsteadiness on feet: Secondary | ICD-10-CM | POA: Diagnosis not present

## 2020-09-27 DIAGNOSIS — M6281 Muscle weakness (generalized): Secondary | ICD-10-CM | POA: Diagnosis not present

## 2020-09-27 DIAGNOSIS — R2681 Unsteadiness on feet: Secondary | ICD-10-CM | POA: Diagnosis not present

## 2020-09-27 DIAGNOSIS — R2689 Other abnormalities of gait and mobility: Secondary | ICD-10-CM | POA: Diagnosis not present

## 2020-09-27 DIAGNOSIS — R262 Difficulty in walking, not elsewhere classified: Secondary | ICD-10-CM | POA: Diagnosis not present

## 2020-09-29 DIAGNOSIS — M6281 Muscle weakness (generalized): Secondary | ICD-10-CM | POA: Diagnosis not present

## 2020-09-29 DIAGNOSIS — R262 Difficulty in walking, not elsewhere classified: Secondary | ICD-10-CM | POA: Diagnosis not present

## 2020-09-29 DIAGNOSIS — R2689 Other abnormalities of gait and mobility: Secondary | ICD-10-CM | POA: Diagnosis not present

## 2020-09-29 DIAGNOSIS — R2681 Unsteadiness on feet: Secondary | ICD-10-CM | POA: Diagnosis not present

## 2020-10-04 DIAGNOSIS — R262 Difficulty in walking, not elsewhere classified: Secondary | ICD-10-CM | POA: Diagnosis not present

## 2020-10-04 DIAGNOSIS — M6281 Muscle weakness (generalized): Secondary | ICD-10-CM | POA: Diagnosis not present

## 2020-10-04 DIAGNOSIS — R2689 Other abnormalities of gait and mobility: Secondary | ICD-10-CM | POA: Diagnosis not present

## 2020-10-04 DIAGNOSIS — R2681 Unsteadiness on feet: Secondary | ICD-10-CM | POA: Diagnosis not present

## 2020-10-06 DIAGNOSIS — R262 Difficulty in walking, not elsewhere classified: Secondary | ICD-10-CM | POA: Diagnosis not present

## 2020-10-06 DIAGNOSIS — R2681 Unsteadiness on feet: Secondary | ICD-10-CM | POA: Diagnosis not present

## 2020-10-06 DIAGNOSIS — R2689 Other abnormalities of gait and mobility: Secondary | ICD-10-CM | POA: Diagnosis not present

## 2020-10-06 DIAGNOSIS — M6281 Muscle weakness (generalized): Secondary | ICD-10-CM | POA: Diagnosis not present

## 2020-10-12 DIAGNOSIS — M6281 Muscle weakness (generalized): Secondary | ICD-10-CM | POA: Diagnosis not present

## 2020-10-12 DIAGNOSIS — R2681 Unsteadiness on feet: Secondary | ICD-10-CM | POA: Diagnosis not present

## 2020-10-12 DIAGNOSIS — R2689 Other abnormalities of gait and mobility: Secondary | ICD-10-CM | POA: Diagnosis not present

## 2020-10-12 DIAGNOSIS — R262 Difficulty in walking, not elsewhere classified: Secondary | ICD-10-CM | POA: Diagnosis not present

## 2020-10-13 DIAGNOSIS — M6281 Muscle weakness (generalized): Secondary | ICD-10-CM | POA: Diagnosis not present

## 2020-10-13 DIAGNOSIS — R2681 Unsteadiness on feet: Secondary | ICD-10-CM | POA: Diagnosis not present

## 2020-10-13 DIAGNOSIS — R262 Difficulty in walking, not elsewhere classified: Secondary | ICD-10-CM | POA: Diagnosis not present

## 2020-10-13 DIAGNOSIS — R2689 Other abnormalities of gait and mobility: Secondary | ICD-10-CM | POA: Diagnosis not present

## 2020-10-19 DIAGNOSIS — M6281 Muscle weakness (generalized): Secondary | ICD-10-CM | POA: Diagnosis not present

## 2020-10-19 DIAGNOSIS — R262 Difficulty in walking, not elsewhere classified: Secondary | ICD-10-CM | POA: Diagnosis not present

## 2020-10-19 DIAGNOSIS — R2689 Other abnormalities of gait and mobility: Secondary | ICD-10-CM | POA: Diagnosis not present

## 2020-10-19 DIAGNOSIS — R2681 Unsteadiness on feet: Secondary | ICD-10-CM | POA: Diagnosis not present

## 2020-10-21 DIAGNOSIS — R262 Difficulty in walking, not elsewhere classified: Secondary | ICD-10-CM | POA: Diagnosis not present

## 2020-10-21 DIAGNOSIS — R2689 Other abnormalities of gait and mobility: Secondary | ICD-10-CM | POA: Diagnosis not present

## 2020-10-21 DIAGNOSIS — M6281 Muscle weakness (generalized): Secondary | ICD-10-CM | POA: Diagnosis not present

## 2020-10-21 DIAGNOSIS — R2681 Unsteadiness on feet: Secondary | ICD-10-CM | POA: Diagnosis not present

## 2020-11-10 NOTE — Progress Notes (Signed)
11/11/2020 10:57 AM   Ashlee Lopez 14-Jan-1934 [id number]  Referring provider: Pleas Koch, NP [place],  [place] [address]  Chief Complaint  Patient presents with   Follow-up    Urological history: 1. OAB - failed Vesicare - failed PTNS -cysto 07/2020 NED   2. rUTI's - contributing factors of age, IBS, limited fluid intake and vaginal atrophy - documented positive urine cultures  Pan sensitive E. Coli on 11/26/2019 treated with Ceftin 250 mg, BID x 5 days  Providencia rettgeri Abnormal  05/11/2020 resistant to ampicillin, cefazolin, nitrofurantoin and tetracycline  Providencia rettgeri Abnormal  07/13/2020  resistant to ampicillin, cefazolin, nitrofurantoin and tetracycline   3. Renal cysts - left simple cysts on RUS 2020   HPI: Ashlee Lopez is a 87 y.o. female who presents today for a three month follow up.   She is not taking the nitrofurantoin.  She continues to have nocturia, but she has not had a sleep study as of this appointment.    Patient denies any modifying or aggravating factors.  Patient denies any gross hematuria, dysuria or suprapubic/flank pain.  Patient denies any fevers, chills, nausea or vomiting.    She continues to have malodorous urine and it is very bothersome.   PMH: Past Medical History:  Diagnosis Date   Benign essential HTN 03/21/2011   Breast cancer, stage 1 ([place]) 02/10/2003   Right tubular breast cancer   Cancer ([place])    Diverticulosis of colon 03/23/2011   Family history of breast cancer    Family history of colon cancer    Family history of melanoma    Family history of prostate cancer    Fibrocystic disease of breast 03/23/2011   Graves' disease with exophthalmos 03/23/2011   Hypertension    Hypothyroid 03/21/2011   IBS (irritable bowel syndrome) 03/23/2011   ITP (idiopathic thrombocytopenic purpura) 03/21/2011   Nasal fracture 01/2020   S/P splenectomy 03/21/2011   Uterus cancer ([place]) 03/23/1999   Well differentiated  AdenoCA of endometrium-superficially confined   Varicose vein of leg 03/23/2011    Surgical History: Past Surgical History:  Procedure Laterality Date   ABDOMINAL HYSTERECTOMY  2001   APPENDECTOMY  1941   BREAST SURGERY  02/17/2003   Mastectomy-Right   MASTECTOMY  2004   Dr Margot Chimes   OOPHORECTOMY     BSO   SKIN CANCER EXCISION  2019   removal of cancer from ear   SPLENECTOMY  1986    Home Medications:  Allergies as of 11/11/2020       Reactions   Sulfamethoxazole-trimethoprim Hives   Septra [bactrim]         Medication List        Accurate as of November 11, 2020 10:57 AM. If you have any questions, ask your nurse or doctor.          amLODipine 10 MG tablet Commonly known as: NORVASC TAKE 1 TABLET BY MOUTH DAILY FOR BLOOD PRESSURE   aspirin 81 MG tablet Take 81 mg by mouth daily.   Calcium Carbonate-Vitamin D 600-200 MG-UNIT Tabs Take by mouth.   CALCIUM-VITAMIN D PO Take by mouth.   CRANBERRY PO Take 3 tablets by mouth daily.   D-MANNOSE PO Take by mouth.   Gemtesa 75 MG Tabs Generic drug: Vibegron Take 75 mg by mouth daily.   levothyroxine 100 MCG tablet Commonly known as: SYNTHROID TAKE 1 TAB BY MOUTH ONCE DAILY. TAKE ON AN EMPTY STOMACH WITH A GLASS  OF WATER ATLEAST 30-60 MINUTES BEFORE BREAKFAST   losartan 100 MG tablet Commonly known as: Cozaar Take 1 tablet (100 mg total) by mouth daily. For high blood pressure   metoprolol tartrate 50 MG tablet Commonly known as: LOPRESSOR TAKE 1 TABLET BY MOUTH TWICE A DAY for blood pressure.   nitrofurantoin (macrocrystal-monohydrate) 100 MG capsule Commonly known as: MACROBID Take one daily Started by: SHANNON MCGOWAN, PA-C   omeprazole 20 MG capsule Commonly known as: PRILOSEC TAKE 1 CAPSULE BY MOUTH ONCE DAILY FOR HEARTBURN   Premarin vaginal cream Generic drug: conjugated estrogens Apply 0.2m (pea-sized amount)  just inside the vaginal introitus with a finger-tip on  Monday, Wednesday and  Friday nights.   rosuvastatin 5 MG tablet Commonly known as: CRESTOR Take 1 tablet (5 mg total) by mouth every other day. For cholesterol.   TYLENOL PM EXTRA STRENGTH PO Take by mouth.   venlafaxine XR 75 MG 24 hr capsule Commonly known as: Effexor XR Take 1 capsule (75 mg total) by mouth daily with breakfast. For anxiety.        Allergies:  Allergies  Allergen Reactions   Sulfamethoxazole-Trimethoprim Hives   Septra [Bactrim]     Family History: Family History  Problem Relation Age of Onset   Breast cancer Mother        Age 85  Hypertension Father    Heart disease Father    Lung cancer Father    Breast cancer Sister        Age 85  Melanoma Sister        dx in her 661s  Dementia Sister    Heart disease Maternal Aunt    Prostate cancer Maternal Uncle    Colon cancer Maternal Grandfather    Colon cancer Maternal Uncle    Prostate cancer Maternal Uncle    Dementia Maternal Aunt    Breast cancer Cousin        maternal 2nd cousin   Breast cancer Cousin        maternal first cousin dx in her 538s  Melanoma Son        dx in his 468s   Social History:  reports that she has never smoked. She has never used smokeless tobacco. She reports that she does not drink alcohol and does not use drugs.  ROS: Pertinent ROS in HPI  Physical Exam: BP (!) 167/90   Pulse 80   Ht 5' 4" (1.626 m)   Wt 146 lb (66.2 kg)   BMI 25.06 kg/m   Constitutional:  Well nourished. Alert and oriented, No acute distress. HEENT: [place] AT, mask in place.  Trachea midline Cardiovascular: No clubbing, cyanosis, or edema. Respiratory: Normal respiratory effort, no increased work of breathing. Neurologic: Grossly intact, no focal deficits, moving all 4 extremities. Psychiatric: Normal mood and affect.    Laboratory Data: Component     Latest Ref Rng & Units 08/24/2020  Glucose     65 - 99 mg/dL 91  BUN     8 - 27 mg/dL 18  Creatinine     0.57 - 1.00 mg/dL 0.73  eGFR     >59 mL/min/1.73  81  BUN/Creatinine Ratio     12 - 28 25  Sodium     134 - 144 mmol/L 139  Potassium     3.5 - 5.2 mmol/L 4.2  Chloride     96 - 106 mmol/L 99  CO2     20 - 29 mmol/L  24  Calcium     8.7 - 10.3 mg/dL 9.7  I have reviewed the labs.   Pertinent Imaging: Results for Ashlee, Lopez (MRN [id number]) as of 07/13/2020 09:59  Ref. Range 07/13/2020 09:50  Scan Result Unknown 140m    Assessment & Plan:    1. OAB -failed anticholinergics -failed PTNS  -encouraged to speak with PCP for sleep study  2. rUTI's -Asymptomatic at today's visit -she is likely colonized-explained this to the patient, but she is not comforted  -will start nitrofurantoin 100 mg daily as she is anxious regarding her urine   3. Vaginal atrophy - continue vaginal estrogen cream three nights weekly   4. Nocturia -has not discussed her risk for sleep apnea with PCP and I have encouraged her to do so  Return if symptoms worsen or fail to improve.  These notes generated with voice recognition software. I apologize for typographical errors.  SZara Council PA-C  [facility] [address] [place]  [phone]

## 2020-11-11 ENCOUNTER — Encounter: Payer: Self-pay | Admitting: Urology

## 2020-11-11 ENCOUNTER — Ambulatory Visit (INDEPENDENT_AMBULATORY_CARE_PROVIDER_SITE_OTHER): Payer: Medicare Other | Admitting: Urology

## 2020-11-11 ENCOUNTER — Other Ambulatory Visit: Payer: Self-pay

## 2020-11-11 VITALS — BP 167/90 | HR 80 | Ht 64.0 in | Wt 146.0 lb

## 2020-11-11 DIAGNOSIS — N390 Urinary tract infection, site not specified: Secondary | ICD-10-CM

## 2020-11-11 DIAGNOSIS — N3281 Overactive bladder: Secondary | ICD-10-CM

## 2020-11-11 DIAGNOSIS — N952 Postmenopausal atrophic vaginitis: Secondary | ICD-10-CM | POA: Diagnosis not present

## 2020-11-11 MED ORDER — NITROFURANTOIN MONOHYD MACRO 100 MG PO CAPS: ORAL_CAPSULE | ORAL | 0 refills | Status: DC

## 2020-11-17 ENCOUNTER — Other Ambulatory Visit: Payer: Self-pay | Admitting: Primary Care

## 2020-11-17 DIAGNOSIS — E039 Hypothyroidism, unspecified: Secondary | ICD-10-CM

## 2020-11-23 ENCOUNTER — Ambulatory Visit (INDEPENDENT_AMBULATORY_CARE_PROVIDER_SITE_OTHER): Payer: Medicare Other | Admitting: Podiatry

## 2020-11-23 ENCOUNTER — Encounter: Payer: Self-pay | Admitting: Podiatry

## 2020-11-23 ENCOUNTER — Other Ambulatory Visit: Payer: Self-pay

## 2020-11-23 DIAGNOSIS — M79676 Pain in unspecified toe(s): Secondary | ICD-10-CM

## 2020-11-23 DIAGNOSIS — B351 Tinea unguium: Secondary | ICD-10-CM

## 2020-11-23 NOTE — Progress Notes (Signed)
She presents today chief complaint of painful elongated toenails 1 through 5 bilaterally.  Objective: Toenails are long thick yellow dystrophic onychomycotic painful palpation 1 through 5 bilateral.  Assessment: Pain in limb secondary onychomycosis 1 through 5 bilateral.  Plan: Debridement of toenails 1 through 5 bilateral.

## 2020-12-21 ENCOUNTER — Other Ambulatory Visit: Payer: Self-pay

## 2020-12-21 ENCOUNTER — Ambulatory Visit (INDEPENDENT_AMBULATORY_CARE_PROVIDER_SITE_OTHER): Payer: Medicare Other | Admitting: Primary Care

## 2020-12-21 ENCOUNTER — Encounter: Payer: Self-pay | Admitting: Primary Care

## 2020-12-21 VITALS — BP 122/80 | HR 84 | Temp 97.8°F | Ht 64.0 in | Wt 147.0 lb

## 2020-12-21 DIAGNOSIS — F411 Generalized anxiety disorder: Secondary | ICD-10-CM

## 2020-12-21 DIAGNOSIS — I10 Essential (primary) hypertension: Secondary | ICD-10-CM | POA: Diagnosis not present

## 2020-12-21 DIAGNOSIS — Z23 Encounter for immunization: Secondary | ICD-10-CM | POA: Diagnosis not present

## 2020-12-21 DIAGNOSIS — E039 Hypothyroidism, unspecified: Secondary | ICD-10-CM

## 2020-12-21 DIAGNOSIS — D693 Immune thrombocytopenic purpura: Secondary | ICD-10-CM

## 2020-12-21 DIAGNOSIS — Z853 Personal history of malignant neoplasm of breast: Secondary | ICD-10-CM

## 2020-12-21 DIAGNOSIS — N390 Urinary tract infection, site not specified: Secondary | ICD-10-CM | POA: Diagnosis not present

## 2020-12-21 DIAGNOSIS — R202 Paresthesia of skin: Secondary | ICD-10-CM | POA: Diagnosis not present

## 2020-12-21 DIAGNOSIS — E785 Hyperlipidemia, unspecified: Secondary | ICD-10-CM | POA: Diagnosis not present

## 2020-12-21 NOTE — Assessment & Plan Note (Addendum)
Improved with Venlafaxine ER 75 mg, breakthrough symptoms intermittently with triggers. Discussed consideration of limiting anxiety trigger of caring for her sister if possible.  Continue Venlafaxine ER 75 mg daily.  I evaluated patient, was consulted regarding treatment, and agree with assessment and plan per Budd Palmer, RN, DNP student.   [name], NP-C

## 2020-12-21 NOTE — Assessment & Plan Note (Addendum)
Asymptomatic today  Adherent to Premarin cream 3 times per week and daily prophylactic Macrobid 100 mg.  Last UTI was in April 2022, culture positive for Providencia rettgeri. Urology is following.   I evaluated patient, was consulted regarding treatment, and agree with assessment and plan per Budd Palmer, RN, DNP student.   [name], NP-C

## 2020-12-21 NOTE — Progress Notes (Signed)
Established Patient Office Visit  Subjective:  Patient ID: Ashlee Lopez, female    DOB: 29-Feb-1934  Age: 87 y.o. MRN: [id number]  CC: No chief complaint on file.   HPI Ashlee Lopez is an 87 year old female with a past medical history of essential hypertension, hypothyroidism, hyperlipidemia, recurrent UTI, invasive breast cancer, GAD, and diverticulitis. She presents today for follow up of chronic conditions.   Hypertension: She reports adherence to 10 mg Amlodipine, 100 mg Losartan, and 50 mg Lopressor daily. She takes her blood pressure intermittently, approximately every 2 weeks. Her last 2 recorded readings were 137/90 & 130/78 and the home readings range from 130s-140s over 70s-80s. She has mild intermittent pedal edema.   BP Readings from Last 3 Encounters:  12/21/20 122/80  11/11/20 (!) 167/90  08/24/20 (!) 146/74     Hypothyroidism: She is adherent to Levothyroxine and takes it appropriately on an empty stomach in the mornings without other medications. Her last TSH from 08/24/20 was 3.1.   Hyperlipidemia: She is adherent to 100 mg Rosuvastatin daily, denies myalgias. Her last lipid panel was at goal in September 2021.  GAD: She reports her anxiety is better controlled with Venlafaxine 75 mg ER daily, but not fully controlled all the time.  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   Recurrent UTI: She is being followed by Urology for this, last seen by them in August 2022. Her last UTI was in April 2022 and her culture was positive for Providencia rettgeri. She is taking prescribed prophylactic Macrobid 100 mg daily and Premarin cream 3 times a week.   Today she is complaining of increased pain in the wrist and finger joints in her hands bilaterally, which is chronic for her. She takes Tylenol PM most nights per week to help with this pain and to aid in sleep.  She also is complaining of paresthesias in her fingertips and toes bilaterally which  she mentioned at the end of the visit.    Past Surgical History:  Procedure Laterality Date   ABDOMINAL HYSTERECTOMY  2001   APPENDECTOMY  1941   BREAST SURGERY  02/17/2003   Mastectomy-Right   MASTECTOMY  2004   Dr Margot Chimes   OOPHORECTOMY     BSO   SKIN CANCER EXCISION  2019   removal of cancer from ear   SPLENECTOMY  1986    Family History  Problem Relation Age of Onset   Breast cancer Mother        Age 56   Hypertension Father    Heart disease Father    Lung cancer Father    Breast cancer Sister        Age 70   Melanoma Sister        dx in her 69s   Dementia Sister    Heart disease Maternal Aunt    Prostate cancer Maternal Uncle    Colon cancer Maternal Grandfather    Colon cancer Maternal Uncle    Prostate cancer Maternal Uncle    Dementia Maternal Aunt    Breast cancer Cousin        maternal 2nd cousin   Breast cancer Cousin        maternal first cousin dx in her 17s   Melanoma Son        dx in his 46s    Social History   Socioeconomic History   Marital status: Widowed    Spouse name: Not  on file   Number of children: Not on file   Years of education: Not on file   Highest education level: Not on file  Occupational History   Not on file  Tobacco Use   Smoking status: Never   Smokeless tobacco: Never  Vaping Use   Vaping Use: Never used  Substance and Sexual Activity   Alcohol use: No   Drug use: No   Sexual activity: Never    Birth control/protection: Surgical  Other Topics Concern   Not on file  Social History Narrative   Widow. Lives alone.    3 children, 5 grandchildren.   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   Enjoys reading, watching TV.   Social Determinants of Health   Financial Resource Strain: Not on file  Food Insecurity: Not on file  Transportation Needs: Not on file  Physical Activity: Not on file  Stress: Not on file  Social Connections: Not on file  Intimate Partner Violence: Not on file    Outpatient Medications Prior  to Visit  Medication Sig Dispense Refill   amLODipine (NORVASC) 10 MG tablet TAKE 1 TABLET BY MOUTH DAILY FOR BLOOD PRESSURE 90 tablet 2   aspirin 81 MG tablet Take 81 mg by mouth daily.     Calcium Carbonate-Vitamin D 600-200 MG-UNIT TABS Take by mouth.     CALCIUM-VITAMIN D PO Take by mouth.     conjugated estrogens (PREMARIN) vaginal cream Apply 0.29m (pea-sized amount)  just inside the vaginal introitus with a finger-tip on  Monday, Wednesday and Friday nights. 30 g 12   CRANBERRY PO Take 3 tablets by mouth daily.     D-MANNOSE PO Take by mouth.     Diphenhydramine-APAP, sleep, (TYLENOL PM EXTRA STRENGTH PO) Take by mouth.     levothyroxine (SYNTHROID) 100 MCG tablet Take 1 tablet by mouth every morning on an empty stomach with water only.  No food or other medications for 30 minutes. 90 tablet 0   losartan (COZAAR) 100 MG tablet Take 1 tablet (100 mg total) by mouth daily. For high blood pressure 90 tablet 3   metoprolol tartrate (LOPRESSOR) 50 MG tablet TAKE 1 TABLET BY MOUTH TWICE A DAY for blood pressure. 180 tablet 3   nitrofurantoin, macrocrystal-monohydrate, (MACROBID) 100 MG capsule Take one daily 90 capsule 0   omeprazole (PRILOSEC) 20 MG capsule TAKE 1 CAPSULE BY MOUTH ONCE DAILY FOR HEARTBURN 90 capsule 1   rosuvastatin (CRESTOR) 5 MG tablet Take 1 tablet (5 mg total) by mouth every other day. For cholesterol. 45 tablet 3   venlafaxine XR (EFFEXOR XR) 75 MG 24 hr capsule Take 1 capsule (75 mg total) by mouth daily with breakfast. For anxiety. 90 capsule 1   Vibegron (GEMTESA) 75 MG TABS Take 75 mg by mouth daily. (Patient not taking: Reported on 11/11/2020) 28 tablet 0   No facility-administered medications prior to visit.    Allergies  Allergen Reactions   Sulfamethoxazole-Trimethoprim Hives   Septra [Bactrim]     ROS Review of Systems  Constitutional: Negative.   Respiratory: Negative.    Cardiovascular: Negative.   Gastrointestinal: Negative.   Genitourinary:  Negative.   Musculoskeletal:  Positive for joint swelling.       MCP swelling in multiple joints bilateral hands  Skin: Negative.   Allergic/Immunologic: Negative for immunocompromised state.  Neurological: Negative.   Psychiatric/Behavioral:  The patient is nervous/anxious.        Baseline and chronic, although she reports improvement with Effexor  Objective:    Physical Exam Constitutional:      Appearance: Normal appearance.  HENT:     Head: Normocephalic.  Cardiovascular:     Rate and Rhythm: Normal rate and regular rhythm.     Pulses: Normal pulses.  Pulmonary:     Effort: Pulmonary effort is normal.     Breath sounds: Normal breath sounds.  Chest:  Breasts:    Breasts are asymmetrical.     Comments: Status post R mastectomy  Abdominal:     General: Abdomen is flat. Bowel sounds are normal.  Musculoskeletal:        General: Swelling present. Normal range of motion.     Cervical back: Neck supple.     Comments: Joint swelling in MCP multiple digits bilaterally Left pedal edema  Skin:    General: Skin is warm and dry.     Findings: No bruising or erythema.  Neurological:     General: No focal deficit present.     Mental Status: She is alert. Mental status is at baseline.  Psychiatric:        Mood and Affect: Mood normal.    There were no vitals taken for this visit. Wt Readings from Last 3 Encounters:  11/11/20 146 lb (66.2 kg)  08/24/20 147 lb (66.7 kg)  08/11/20 143 lb (64.9 kg)     Health Maintenance Due  Topic Date Due   Zoster Vaccines- Shingrix (1 of 2) Never done   COVID-19 Vaccine (4 - Booster for Pfizer series) 05/11/2020   INFLUENZA VACCINE  10/17/2020    There are no preventive care reminders to display for this patient.  Lab Results  Component Value Date   TSH 3.100 08/24/2020   Lab Results  Component Value Date   WBC 11.4 (H) 02/02/2020   HGB 12.9 02/02/2020   HCT 37.6 02/02/2020   MCV 94.5 02/02/2020   PLT 296 02/02/2020    Lab Results  Component Value Date   NA 139 08/24/2020   K 4.2 08/24/2020   CHLORIDE 96 (L) 03/20/2013   CO2 24 08/24/2020   GLUCOSE 91 08/24/2020   BUN 18 08/24/2020   CREATININE 0.73 08/24/2020   BILITOT 1.0 02/02/2020   ALKPHOS 67 02/02/2020   AST 40 02/02/2020   ALT 30 02/02/2020   PROT 8.1 02/02/2020   ALBUMIN 4.7 02/02/2020   CALCIUM 9.7 08/24/2020   ANIONGAP 9 02/02/2020   EGFR 81 08/24/2020   GFR 79.66 02/24/2020   Lab Results  Component Value Date   CHOL 165 12/16/2019   Lab Results  Component Value Date   HDL 64 12/16/2019   Lab Results  Component Value Date   LDLCALC 87 12/16/2019   Lab Results  Component Value Date   TRIG 75 12/16/2019   Lab Results  Component Value Date   CHOLHDL 2.6 12/16/2019   Lab Results  Component Value Date   HGBA1C 5.9 (H) 12/16/2019      Assessment & Plan:   Problem List Items Addressed This Visit   None   No orders of the defined types were placed in this encounter.   Follow-up: No follow-ups on file.    Ninfa Meeker, RN

## 2020-12-21 NOTE — Patient Instructions (Signed)
It was very nice to see you today!  Stop by the lab prior to leaving today. I will notify you of your results once received.

## 2020-12-21 NOTE — Progress Notes (Signed)
Subjective:    Patient ID: Ashlee Lopez, female    DOB: September 19, 1933, 87 y.o.   MRN: [id number]  HPI  Ashlee Lopez is a very pleasant 87 y.o. female with a history of hypothyroidism, hypertension, recurrent UTI, GAD who presents today for follow up of chronic conditions.  1) Essential Hypertension: Currently managed on amlodipine 10 mg, losartan 100 mg, metoprolol tartrate 50 mg BID. She denies chest pain, headaches, dizziness.   2) Recurrent UTI/OAB: Currently following with Urology, managed on daily Macrobid and Premarin vaginal cream three times weekly for prevention. Also managed on Gemtesa for OAB. Last Urology visit was in August 2022.   3) Hypothyroidism: Currently managed on levothyroxine 100 mcg. She is taking levothyroxine every morning on an empty stomach with water only. No food or other medications for 30 minutes.  4) GAD: Chronic history. Currently managed on venlafaxine ER 75 mg daily. Overall feels she is doing better on this regimen. She still gets acute anxiety episodes after visiting with her chronically ill sister who has Azlheimer's disease. Outside of visiting her sister her anxiety level is low.   BP Readings from Last 3 Encounters:  12/21/20 122/80  11/11/20 (!) 167/90  08/24/20 (!) 146/74     Review of Systems  Respiratory:  Negative for shortness of breath.   Cardiovascular:  Negative for chest pain.  Genitourinary:        See HPI  Neurological:  Negative for dizziness.  Psychiatric/Behavioral:  The patient is nervous/anxious.        See HPI        Past Medical History:  Diagnosis Date   Benign essential HTN 03/21/2011   Breast cancer, stage 1 ([place]) 02/10/2003   Right tubular breast cancer   Cancer ([place])    Diverticulosis of colon 03/23/2011   Family history of breast cancer    Family history of colon cancer    Family history of melanoma    Family history of prostate cancer    Fibrocystic disease of breast 03/23/2011   Graves' disease with  exophthalmos 03/23/2011   Hypertension    Hypothyroid 03/21/2011   IBS (irritable bowel syndrome) 03/23/2011   ITP (idiopathic thrombocytopenic purpura) 03/21/2011   Nasal fracture 01/2020   S/P splenectomy 03/21/2011   Uterus cancer ([place]) 03/23/1999   Well differentiated AdenoCA of endometrium-superficially confined   Varicose vein of leg 03/23/2011    Social History   Socioeconomic History   Marital status: Widowed    Spouse name: Not on file   Number of children: Not on file   Years of education: Not on file   Highest education level: Not on file  Occupational History   Not on file  Tobacco Use   Smoking status: Never   Smokeless tobacco: Never  Vaping Use   Vaping Use: Never used  Substance and Sexual Activity   Alcohol use: No   Drug use: No   Sexual activity: Never    Birth control/protection: Surgical  Other Topics Concern   Not on file  Social History Narrative   Widow. Lives alone.    3 children, 5 grandchildren.   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   Enjoys reading, watching TV.   Social Determinants of Health   Financial Resource Strain: Not on file  Food Insecurity: Not on file  Transportation Needs: Not on file  Physical Activity: Not on file  Stress: Not on file  Social Connections: Not on file  Intimate Partner Violence: Not on file  Past Surgical History:  Procedure Laterality Date   ABDOMINAL HYSTERECTOMY  2001   APPENDECTOMY  1941   BREAST SURGERY  02/17/2003   Mastectomy-Right   MASTECTOMY  2004   Dr Margot Chimes   OOPHORECTOMY     BSO   SKIN CANCER EXCISION  2019   removal of cancer from ear   SPLENECTOMY  1986    Family History  Problem Relation Age of Onset   Breast cancer Mother        Age 77   Hypertension Father    Heart disease Father    Lung cancer Father    Breast cancer Sister        Age 79   Melanoma Sister        dx in her 53s   Dementia Sister    Heart disease Maternal Aunt    Prostate cancer Maternal Uncle    Colon cancer  Maternal Grandfather    Colon cancer Maternal Uncle    Prostate cancer Maternal Uncle    Dementia Maternal Aunt    Breast cancer Cousin        maternal 2nd cousin   Breast cancer Cousin        maternal first cousin dx in her 76s   Melanoma Son        dx in his 62s    Allergies  Allergen Reactions   Sulfamethoxazole-Trimethoprim Hives   Septra [Bactrim]     Current Outpatient Medications on File Prior to Visit  Medication Sig Dispense Refill   amLODipine (NORVASC) 10 MG tablet TAKE 1 TABLET BY MOUTH DAILY FOR BLOOD PRESSURE 90 tablet 2   aspirin 81 MG tablet Take 81 mg by mouth daily.     Calcium Carbonate-Vitamin D 600-200 MG-UNIT TABS Take by mouth.     CALCIUM-VITAMIN D PO Take by mouth.     conjugated estrogens (PREMARIN) vaginal cream Apply 0.5mg  (pea-sized amount)  just inside the vaginal introitus with a finger-tip on  Monday, Wednesday and Friday nights. 30 g 12   CRANBERRY PO Take 3 tablets by mouth daily.     D-MANNOSE PO Take by mouth.     Diphenhydramine-APAP, sleep, (TYLENOL PM EXTRA STRENGTH PO) Take by mouth.     levothyroxine (SYNTHROID) 100 MCG tablet Take 1 tablet by mouth every morning on an empty stomach with water only.  No food or other medications for 30 minutes. 90 tablet 0   losartan (COZAAR) 100 MG tablet Take 1 tablet (100 mg total) by mouth daily. For high blood pressure 90 tablet 3   metoprolol tartrate (LOPRESSOR) 50 MG tablet TAKE 1 TABLET BY MOUTH TWICE A DAY for blood pressure. 180 tablet 3   nitrofurantoin, macrocrystal-monohydrate, (MACROBID) 100 MG capsule Take one daily 90 capsule 0   omeprazole (PRILOSEC) 20 MG capsule TAKE 1 CAPSULE BY MOUTH ONCE DAILY FOR HEARTBURN 90 capsule 1   rosuvastatin (CRESTOR) 5 MG tablet Take 1 tablet (5 mg total) by mouth every other day. For cholesterol. 45 tablet 3   venlafaxine XR (EFFEXOR XR) 75 MG 24 hr capsule Take 1 capsule (75 mg total) by mouth daily with breakfast. For anxiety. 90 capsule 1   Vibegron  (GEMTESA) 75 MG TABS Take 75 mg by mouth daily. 28 tablet 0   No current facility-administered medications on file prior to visit.    BP 122/80 (BP Location: Left Arm, Patient Position: Sitting, Cuff Size: Normal)   Pulse 84   Temp 97.8 F (36.6 C) (Temporal)  Ht 5\' 4"  (1.626 m)   Wt 147 lb (66.7 kg)   SpO2 96%   BMI 25.23 kg/m  Objective:   Physical Exam Cardiovascular:     Rate and Rhythm: Normal rate and regular rhythm.  Pulmonary:     Effort: Pulmonary effort is normal.     Breath sounds: Normal breath sounds.  Chest:  Breasts:    Right: Absent. No mass or skin change.     Left: No swelling, mass or skin change.  Musculoskeletal:     Cervical back: Neck supple.  Skin:    General: Skin is warm and dry.          Assessment & Plan:      This visit occurred during the SARS-CoV-2 public health emergency.  Safety protocols were in place, including screening questions prior to the visit, additional usage of staff PPE, and extensive cleaning of exam room while observing appropriate contact time as indicated for disinfecting solutions.

## 2020-12-21 NOTE — Assessment & Plan Note (Addendum)
Mammogram updated  April 2022  S/P right mastectomy, breast exam today unremarkable.  I evaluated patient, was consulted regarding treatment, and agree with assessment and plan per Budd Palmer, RN, DNP student.   [name], NP-C

## 2020-12-21 NOTE — Assessment & Plan Note (Addendum)
Last lipid panel in 11/2019 was at goal. Adherent to 100 mg Rosuvastatin daily, continue this.  Labs pending.  I evaluated patient, was consulted regarding treatment, and agree with assessment and plan per Budd Palmer, RN, DNP student.   [name], NP-C'

## 2020-12-21 NOTE — Assessment & Plan Note (Addendum)
Exam unremarkable  CBC pending. Last CBC reviewed and was stable.  I evaluated patient, was consulted regarding treatment, and agree with assessment and plan per Budd Palmer, RN, DNP student.   [name], NP-C

## 2020-12-21 NOTE — Assessment & Plan Note (Addendum)
Adherent to 100 mcg of Levothyroxine daily, taking this correctly. Last TSH at goal, 3.1  Continue 100 mcg Levothyroxine daily.  TSH pending  I evaluated patient, was consulted regarding treatment, and agree with assessment and plan per Budd Palmer, RN, DNP student.   [name], NP-C

## 2020-12-21 NOTE — Assessment & Plan Note (Addendum)
Patient mentioned this on her way out today. Located to fingertips and toes. Didn't have time to discuss today.  Following with podiatry  B12 pending.   I evaluated patient, was consulted regarding treatment, and agree with assessment and plan per Budd Palmer, RN, DNP student.   [name], NP-C

## 2020-12-21 NOTE — Assessment & Plan Note (Addendum)
Stable today in office. Home readings mostly at goal.  Continue Amlodipine 10 mg, Losartan 100 mg, and Metoprolol tartrate 50 mg daily  CMP pending  I evaluated patient, was consulted regarding treatment, and agree with assessment and plan per Budd Palmer, RN, DNP student.   [name], NP-C

## 2020-12-22 LAB — LIPID PANEL
Chol/HDL Ratio: 2.6 ratio (ref 0.0–4.4)
Cholesterol, Total: 162 mg/dL (ref 100–199)
HDL: 63 mg/dL (ref >39)
LDL Chol Calc (NIH): 83 mg/dL (ref 0–99)
Triglycerides: 88 mg/dL (ref 0–149)
VLDL Cholesterol Cal: 16 mg/dL (ref 5–40)

## 2020-12-22 LAB — CBC
Hematocrit: 36.7 % (ref 34.0–46.6)
Hemoglobin: 12.7 g/dL (ref 11.1–15.9)
MCH: 31.7 pg (ref 26.6–33.0)
MCHC: 34.6 g/dL (ref 31.5–35.7)
MCV: 92 fL (ref 79–97)
Platelets: 265 10*3/uL (ref 150–450)
RBC: 4.01 x10E6/uL (ref 3.77–5.28)
RDW: 13 % (ref 11.7–15.4)
WBC: 6.8 10*3/uL (ref 3.4–10.8)

## 2020-12-22 LAB — COMPREHENSIVE METABOLIC PANEL
ALT: 17 IU/L (ref 0–32)
AST: 27 IU/L (ref 0–40)
Albumin/Globulin Ratio: 1.6 (ref 1.2–2.2)
Albumin: 4.4 g/dL (ref 3.6–4.6)
Alkaline Phosphatase: 92 IU/L (ref 44–121)
BUN/Creatinine Ratio: 27 (ref 12–28)
BUN: 17 mg/dL (ref 8–27)
Bilirubin Total: 0.5 mg/dL (ref 0.0–1.2)
CO2: 20 mmol/L (ref 20–29)
Calcium: 9.7 mg/dL (ref 8.7–10.3)
Chloride: 100 mmol/L (ref 96–106)
Creatinine, Ser: 0.64 mg/dL (ref 0.57–1.00)
Globulin, Total: 2.8 g/dL (ref 1.5–4.5)
Glucose: 106 mg/dL — ABNORMAL HIGH (ref 70–99)
Potassium: 4.1 mmol/L (ref 3.5–5.2)
Sodium: 137 mmol/L (ref 134–144)
Total Protein: 7.2 g/dL (ref 6.0–8.5)
eGFR: 87 mL/min/{1.73_m2} (ref >59)

## 2020-12-22 LAB — TSH: TSH: 1.37 u[IU]/mL (ref 0.450–4.500)

## 2020-12-22 LAB — VITAMIN B12: Vitamin B-12: 349 pg/mL (ref 232–1245)

## 2021-01-16 ENCOUNTER — Other Ambulatory Visit: Payer: Self-pay | Admitting: Urology

## 2021-01-16 DIAGNOSIS — N952 Postmenopausal atrophic vaginitis: Secondary | ICD-10-CM

## 2021-01-23 ENCOUNTER — Other Ambulatory Visit: Payer: Self-pay | Admitting: Primary Care

## 2021-01-23 DIAGNOSIS — I10 Essential (primary) hypertension: Secondary | ICD-10-CM

## 2021-01-31 DIAGNOSIS — M25512 Pain in left shoulder: Secondary | ICD-10-CM | POA: Diagnosis not present

## 2021-02-07 ENCOUNTER — Other Ambulatory Visit: Payer: Self-pay | Admitting: Urology

## 2021-02-13 ENCOUNTER — Other Ambulatory Visit: Payer: Self-pay | Admitting: Primary Care

## 2021-02-13 DIAGNOSIS — E039 Hypothyroidism, unspecified: Secondary | ICD-10-CM

## 2021-02-13 DIAGNOSIS — F411 Generalized anxiety disorder: Secondary | ICD-10-CM

## 2021-02-22 ENCOUNTER — Encounter (INDEPENDENT_AMBULATORY_CARE_PROVIDER_SITE_OTHER): Payer: Self-pay

## 2021-02-22 ENCOUNTER — Other Ambulatory Visit: Payer: Self-pay

## 2021-02-22 ENCOUNTER — Encounter: Payer: Self-pay | Admitting: Podiatry

## 2021-02-22 ENCOUNTER — Ambulatory Visit (INDEPENDENT_AMBULATORY_CARE_PROVIDER_SITE_OTHER): Payer: Medicare Other | Admitting: Podiatry

## 2021-02-22 DIAGNOSIS — B351 Tinea unguium: Secondary | ICD-10-CM

## 2021-02-22 DIAGNOSIS — M79676 Pain in unspecified toe(s): Secondary | ICD-10-CM | POA: Diagnosis not present

## 2021-02-22 NOTE — Progress Notes (Signed)
Presents today chief complaint of painful elongated toenails.  Objective: Toenails are long thick yellow dystrophic onychomycotic.  Assessment: Pain limb secondary to onychomycosis.  Plan: Debridement of toenails 1 through 5 bilateral.

## 2021-02-27 ENCOUNTER — Other Ambulatory Visit: Payer: Self-pay

## 2021-02-27 ENCOUNTER — Encounter: Payer: Self-pay | Admitting: Internal Medicine

## 2021-02-27 ENCOUNTER — Ambulatory Visit: Payer: Medicare Other | Admitting: Family Medicine

## 2021-02-27 ENCOUNTER — Ambulatory Visit (INDEPENDENT_AMBULATORY_CARE_PROVIDER_SITE_OTHER): Payer: Medicare Other | Admitting: Internal Medicine

## 2021-02-27 DIAGNOSIS — R051 Acute cough: Secondary | ICD-10-CM

## 2021-02-27 NOTE — Patient Instructions (Signed)
Please try an over the counter cough suppressant (dextromethorphan) till your cough settles down. You should probably take the acid blocker, omeprazole, daily on an empty stomach for the next 1-2 weeks also. Let me know if things aren't settling down in the next week or so.

## 2021-02-27 NOTE — Progress Notes (Signed)
Subjective:    Patient ID: Ashlee Lopez, female    DOB: 1933/05/24, 87 y.o.   MRN: [id number]  HPI Here due to persistent cough Came in mostly due to son's concern--she thought it would go away  Started a little before Thanksgiving with raw throat Then started coughing---phlegm once in a while----clear or yellow/green May be improving in the past couple of days  No fever No SOB No headache or sinus pressure--but has had some post nasal drip Tried delsym and allergy pills (chlorpheniramine)  Current Outpatient Medications on File Prior to Visit  Medication Sig Dispense Refill   amLODipine (NORVASC) 10 MG tablet TAKE 1 TABLET BY MOUTH DAILY FOR BLOOD PRESSURE 90 tablet 2   aspirin 81 MG tablet Take 81 mg by mouth daily.     Calcium Carbonate-Vitamin D 600-200 MG-UNIT TABS Take by mouth.     CALCIUM-VITAMIN D PO Take by mouth.     conjugated estrogens (PREMARIN) vaginal cream Apply 0.5mg  (pea-sized amount)  just inside the vaginal introitus with a finger-tip on  Monday, Wednesday and Friday nights. 30 g 12   CRANBERRY PO Take 3 tablets by mouth daily.     D-MANNOSE PO Take by mouth.     Diphenhydramine-APAP, sleep, (TYLENOL PM EXTRA STRENGTH PO) Take by mouth.     levothyroxine (SYNTHROID) 100 MCG tablet TAKE 1 TAB BY MOUTH ONCE DAILY. TAKE ON AN EMPTY STOMACH WITH A GLASS OF WATER ATLEAST 30-60 MINUTES BEFORE BREAKFAST 90 tablet 2   losartan (COZAAR) 100 MG tablet TAKE 1 TABLET BY MOUTH ONCE A DAY FOR HIGH BLOOD PRESSURE. 90 tablet 3   metoprolol tartrate (LOPRESSOR) 50 MG tablet TAKE 1 TABLET BY MOUTH TWICE A DAY for blood pressure. 180 tablet 3   nitrofurantoin, macrocrystal-monohydrate, (MACROBID) 100 MG capsule TAKE 1 TABLET BY MOUTH ONCE A DAY 90 capsule 0   omeprazole (PRILOSEC) 20 MG capsule TAKE 1 CAPSULE BY MOUTH ONCE DAILY FOR HEARTBURN 90 capsule 1   rosuvastatin (CRESTOR) 5 MG tablet Take 1 tablet (5 mg total) by mouth every other day. For cholesterol. 45 tablet 3    venlafaxine XR (EFFEXOR-XR) 75 MG 24 hr capsule TAKE 1 CAPSULE BY MOUTH ONCE DAILY WITH BREAKFAST FOR ANXIETY 90 capsule 2   No current facility-administered medications on file prior to visit.    Allergies  Allergen Reactions   Sulfamethoxazole-Trimethoprim Hives   Septra [Bactrim]     Past Medical History:  Diagnosis Date   Benign essential HTN 03/21/2011   Breast cancer, stage 1 ([place]) 02/10/2003   Right tubular breast cancer   Cancer ([place])    Diverticulosis of colon 03/23/2011   Family history of breast cancer    Family history of colon cancer    Family history of melanoma    Family history of prostate cancer    Fibrocystic disease of breast 03/23/2011   Graves' disease with exophthalmos 03/23/2011   Hypertension    Hypothyroid 03/21/2011   IBS (irritable bowel syndrome) 03/23/2011   ITP (idiopathic thrombocytopenic purpura) 03/21/2011   Nasal fracture 01/2020   S/P splenectomy 03/21/2011   Uterus cancer ([place]) 03/23/1999   Well differentiated AdenoCA of endometrium-superficially confined   Varicose vein of leg 03/23/2011    Past Surgical History:  Procedure Laterality Date   ABDOMINAL HYSTERECTOMY  2001   APPENDECTOMY  1941   BREAST SURGERY  02/17/2003   Mastectomy-Right   MASTECTOMY  2004   Dr Margot Chimes   OOPHORECTOMY     BSO  SKIN CANCER EXCISION  2019   removal of cancer from ear   SPLENECTOMY  1986    Family History  Problem Relation Age of Onset   Breast cancer Mother        Age 79   Hypertension Father    Heart disease Father    Lung cancer Father    Breast cancer Sister        Age 14   Melanoma Sister        dx in her 25s   Dementia Sister    Heart disease Maternal Aunt    Prostate cancer Maternal Uncle    Colon cancer Maternal Grandfather    Colon cancer Maternal Uncle    Prostate cancer Maternal Uncle    Dementia Maternal Aunt    Breast cancer Cousin        maternal 2nd cousin   Breast cancer Cousin        maternal first cousin dx in her 26s   Melanoma  Son        dx in his 75s    Social History   Socioeconomic History   Marital status: Widowed    Spouse name: Not on file   Number of children: Not on file   Years of education: Not on file   Highest education level: Not on file  Occupational History   Not on file  Tobacco Use   Smoking status: Never   Smokeless tobacco: Never  Vaping Use   Vaping Use: Never used  Substance and Sexual Activity   Alcohol use: No   Drug use: No   Sexual activity: Never    Birth control/protection: Surgical  Other Topics Concern   Not on file  Social History Narrative   Widow. Lives alone.    3 children, 5 grandchildren.   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   Enjoys reading, watching TV.   Social Determinants of Health   Financial Resource Strain: Not on file  Food Insecurity: Not on file  Transportation Needs: Not on file  Physical Activity: Not on file  Stress: Not on file  Social Connections: Not on file  Intimate Partner Violence: Not on file   Review of Systems Some chronic cough still ---goes back years No regular heartburn---has omeprazole for prn but rarely needs No dysphagia ---but occ cough with eating and laughing    Objective:   Physical Exam Constitutional:      Appearance: Normal appearance.  HENT:     Head:     Comments: Slight maxillary tenderness    Right Ear: Tympanic membrane and ear canal normal.     Left Ear: Tympanic membrane and ear canal normal.     Nose: No congestion or rhinorrhea.     Mouth/Throat:     Pharynx: No oropharyngeal exudate or posterior oropharyngeal erythema.  Pulmonary:     Effort: Pulmonary effort is normal.     Breath sounds: Normal breath sounds. No wheezing, rhonchi or rales.  Musculoskeletal:     Cervical back: Neck supple.  Lymphadenopathy:     Cervical: No cervical adenopathy.  Neurological:     Mental Status: She is alert.           Assessment & Plan:

## 2021-02-27 NOTE — Assessment & Plan Note (Signed)
Some degree of chronic cough but noticeable since respiratory illness starting about 2 weeks ago Clearly doesn't sound sick Lungs are clear Discussed that this should just fade away Recommended DM cough syrup Probably should take omeprazole daily for the next 1-2 weeks Consider CXR if not improving in 1-2 weeks

## 2021-02-28 DIAGNOSIS — H25013 Cortical age-related cataract, bilateral: Secondary | ICD-10-CM | POA: Diagnosis not present

## 2021-02-28 DIAGNOSIS — H2513 Age-related nuclear cataract, bilateral: Secondary | ICD-10-CM | POA: Diagnosis not present

## 2021-02-28 DIAGNOSIS — H25043 Posterior subcapsular polar age-related cataract, bilateral: Secondary | ICD-10-CM | POA: Diagnosis not present

## 2021-02-28 DIAGNOSIS — H18413 Arcus senilis, bilateral: Secondary | ICD-10-CM | POA: Diagnosis not present

## 2021-02-28 DIAGNOSIS — H2512 Age-related nuclear cataract, left eye: Secondary | ICD-10-CM | POA: Diagnosis not present

## 2021-03-07 ENCOUNTER — Other Ambulatory Visit: Payer: Self-pay | Admitting: Primary Care

## 2021-03-07 DIAGNOSIS — I10 Essential (primary) hypertension: Secondary | ICD-10-CM

## 2021-04-10 ENCOUNTER — Other Ambulatory Visit: Payer: Self-pay | Admitting: Urology

## 2021-04-11 ENCOUNTER — Other Ambulatory Visit: Payer: Self-pay | Admitting: *Deleted

## 2021-04-11 DIAGNOSIS — N952 Postmenopausal atrophic vaginitis: Secondary | ICD-10-CM

## 2021-04-11 MED ORDER — PREMARIN 0.625 MG/GM VA CREA: TOPICAL_CREAM | VAGINAL | 12 refills | Status: DC

## 2021-05-15 ENCOUNTER — Other Ambulatory Visit: Payer: Self-pay | Admitting: Primary Care

## 2021-05-15 DIAGNOSIS — E785 Hyperlipidemia, unspecified: Secondary | ICD-10-CM

## 2021-05-15 NOTE — Telephone Encounter (Signed)
Last lipid 12-21-20 at OV No Future OV

## 2021-05-22 DIAGNOSIS — H2512 Age-related nuclear cataract, left eye: Secondary | ICD-10-CM | POA: Diagnosis not present

## 2021-05-23 DIAGNOSIS — H2511 Age-related nuclear cataract, right eye: Secondary | ICD-10-CM | POA: Diagnosis not present

## 2021-05-24 ENCOUNTER — Ambulatory Visit: Payer: Medicare Other | Admitting: Podiatry

## 2021-05-31 ENCOUNTER — Encounter: Payer: Self-pay | Admitting: Podiatry

## 2021-05-31 ENCOUNTER — Ambulatory Visit (INDEPENDENT_AMBULATORY_CARE_PROVIDER_SITE_OTHER): Payer: Medicare Other | Admitting: Podiatry

## 2021-05-31 ENCOUNTER — Other Ambulatory Visit: Payer: Self-pay

## 2021-05-31 DIAGNOSIS — M79676 Pain in unspecified toe(s): Secondary | ICD-10-CM | POA: Diagnosis not present

## 2021-05-31 DIAGNOSIS — B351 Tinea unguium: Secondary | ICD-10-CM | POA: Diagnosis not present

## 2021-05-31 NOTE — Progress Notes (Signed)
Presents today chief complaint of painful elongated toenails. ? ?Objective: Toenails are long thick yellow dystrophic onychomycotic and painful.  Pulses remain palpable.  No open lesions or wounds. ? ?Assessment: Pain in limb secondary onychomycosis. ? ?Plan: Debridement of toenails 1 through 5 bilateral. ?

## 2021-06-12 DIAGNOSIS — H2511 Age-related nuclear cataract, right eye: Secondary | ICD-10-CM | POA: Diagnosis not present

## 2021-06-12 DIAGNOSIS — H2513 Age-related nuclear cataract, bilateral: Secondary | ICD-10-CM | POA: Diagnosis not present

## 2021-06-16 ENCOUNTER — Telehealth: Payer: Self-pay | Admitting: Primary Care

## 2021-06-16 NOTE — Telephone Encounter (Signed)
Left message for patient to call back and schedule the Medicare Annual Wellness Visit (AWV) virtually or by telephone. ? ?Last AWV 12/16/19 ? ? ?Any questions, please call me at [phone]  ?

## 2021-07-12 DIAGNOSIS — Z85828 Personal history of other malignant neoplasm of skin: Secondary | ICD-10-CM | POA: Diagnosis not present

## 2021-07-12 DIAGNOSIS — L821 Other seborrheic keratosis: Secondary | ICD-10-CM | POA: Diagnosis not present

## 2021-07-12 DIAGNOSIS — L820 Inflamed seborrheic keratosis: Secondary | ICD-10-CM | POA: Diagnosis not present

## 2021-07-12 DIAGNOSIS — L720 Epidermal cyst: Secondary | ICD-10-CM | POA: Diagnosis not present

## 2021-07-12 DIAGNOSIS — D1801 Hemangioma of skin and subcutaneous tissue: Secondary | ICD-10-CM | POA: Diagnosis not present

## 2021-07-12 DIAGNOSIS — D225 Melanocytic nevi of trunk: Secondary | ICD-10-CM | POA: Diagnosis not present

## 2021-07-12 DIAGNOSIS — L905 Scar conditions and fibrosis of skin: Secondary | ICD-10-CM | POA: Diagnosis not present

## 2021-07-18 NOTE — Progress Notes (Signed)
? ? ?07/19/2021 ?4:04 PM  ? ?Ashlee Lopez ?17-Jan-1934 ?[id number] ? ?Referring provider: Pleas Koch, NP ?[address] ?[place],  [place] [address] ? ?Chief Complaint  ?Patient presents with  ? Dysuria  ? ?Urological history: ?1. OAB ?-Contributing factors of age, vaginal atrophy, IBS, limited fluid intake, hypertension and anxiety ?- failed Vesicare ?- failed PTNS ?-cysto 07/2020 NED  ? ?2. rUTI's ?- contributing factors of age, IBS, limited fluid intake and vaginal atrophy ?-RUS 2020 - left renal cysts ?- documented positive urine cultures over the last year ? None ?  ?3. Renal cysts ?- left simple cysts on RUS 2020  ? ?HPI: ?Ashlee Lopez is a 88 y.o. female who presents today for possible UTI.  ? ?She started to have suprapubic pressure and dysuria x week.  Patient denies any modifying or aggravating factors.  Patient denies any gross hematuria, dysuria or suprapubic/flank pain.  Patient denies any fevers, chills, nausea or vomiting.   ? ?She is taking OTC which has helped.   ? ?UA > 30 WBC's and many bacteria.   ? ?PMH: ?Past Medical History:  ?Diagnosis Date  ? Benign essential HTN 03/21/2011  ? Breast cancer, stage 1 ([place]) 02/10/2003  ? Right tubular breast cancer  ? Cancer [facility])   ? Diverticulosis of colon 03/23/2011  ? Family history of breast cancer   ? Family history of colon cancer   ? Family history of melanoma   ? Family history of prostate cancer   ? Fibrocystic disease of breast 03/23/2011  ? Graves' disease with exophthalmos 03/23/2011  ? Hypertension   ? Hypothyroid 03/21/2011  ? IBS (irritable bowel syndrome) 03/23/2011  ? ITP (idiopathic thrombocytopenic purpura) 03/21/2011  ? Nasal fracture 01/2020  ? S/P splenectomy 03/21/2011  ? Uterus cancer ([place]) 03/23/1999  ? 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 Varicose vein of leg 03/23/2011  ? ? ?Surgical History: ?Past Surgical History:  ?Procedure Laterality Date  ? ABDOMINAL HYSTERECTOMY  2001  ? APPENDECTOMY  1941  ? BREAST SURGERY   02/17/2003  ? Mastectomy-Right  ? MASTECTOMY  2004  ? Dr Margot Chimes  ? OOPHORECTOMY    ? BSO  ? SKIN CANCER EXCISION  2019  ? removal of cancer from ear  ? SPLENECTOMY  1986  ? ? ?Home Medications:  ?Allergies as of 07/19/2021   ? ?   Reactions  ? Sulfamethoxazole-trimethoprim Hives  ? Septra [bactrim]   ? ?  ? ?  ?Medication List  ?  ? ?  ? Accurate as of Jul 19, 2021  4:04 PM. If you have any questions, ask your nurse or doctor.  ?  ?  ? ?  ? ?amLODipine 10 MG tablet ?Commonly known as: NORVASC ?TAKE 1 TABLET BY MOUTH DAILY FOR BLOOD PRESSURE ?  ?aspirin 81 MG tablet ?Take 81 mg by mouth daily. ?  ?Calcium Carbonate-Vitamin D 600-200 MG-UNIT Tabs ?Take by mouth. ?  ?CALCIUM-VITAMIN D PO ?Take by mouth. ?  ?cefUROXime 500 MG tablet ?Commonly known as: CEFTIN ?Take 1 tablet (500 mg total) by mouth 2 (two) times daily with a meal. ?Started by: Zara Council, PA-C ?  ?CRANBERRY PO ?Take 3 tablets by mouth daily. ?  ?D-MANNOSE PO ?Take by mouth. ?  ?levothyroxine 100 MCG tablet ?Commonly known as: SYNTHROID ?TAKE 1 TAB BY MOUTH ONCE DAILY. TAKE ON AN EMPTY STOMACH WITH A GLASS OF WATER ATLEAST 30-60 MINUTES BEFORE BREAKFAST ?  ?losartan 100 MG tablet ?Commonly known  as: COZAAR ?TAKE 1 TABLET BY MOUTH ONCE A DAY FOR HIGH BLOOD PRESSURE. ?  ?metoprolol tartrate 50 MG tablet ?Commonly known as: LOPRESSOR ?TAKE 1 TABLET BY MOUTH TWICE A DAY for blood pressure. ?  ?nitrofurantoin (macrocrystal-monohydrate) 100 MG capsule ?Commonly known as: MACROBID ?TAKE 1 TABLET BY MOUTH ONCE A DAY ?  ?omeprazole 20 MG capsule ?Commonly known as: PRILOSEC ?TAKE 1 CAPSULE BY MOUTH ONCE DAILY FOR HEARTBURN ?  ?Premarin vaginal cream ?Generic drug: conjugated estrogens ?Apply 0.'5mg'$  (pea-sized amount)  just inside the vaginal introitus with a finger-tip on  Monday, Wednesday and Friday nights. ?  ?rosuvastatin 5 MG tablet ?Commonly known as: CRESTOR ?Take 1 tablet (5 mg total) by mouth every other day. For cholesterol. ?  ?TYLENOL PM EXTRA  STRENGTH PO ?Take by mouth. ?  ?venlafaxine XR 75 MG 24 hr capsule ?Commonly known as: EFFEXOR-XR ?TAKE 1 CAPSULE BY MOUTH ONCE DAILY WITH BREAKFAST FOR ANXIETY ?  ? ?  ? ? ?Allergies:  ?Allergies  ?Allergen Reactions  ? Sulfamethoxazole-Trimethoprim Hives  ? Septra [Bactrim]   ? ? ?Family History: ?Family History  ?Problem Relation Age of Onset  ? Breast cancer Mother   ?     Age 52  ? Hypertension Father   ? Heart disease Father   ? Lung cancer Father   ? Breast cancer Sister   ?     Age 58  ? Melanoma Sister   ?     dx in her 44s  ? Dementia Sister   ? Heart disease Maternal Aunt   ? Prostate cancer Maternal Uncle   ? Colon cancer Maternal Grandfather   ? Colon cancer Maternal Uncle   ? Prostate cancer Maternal Uncle   ? Dementia Maternal Aunt   ? Breast cancer Cousin   ?     maternal 2nd cousin  ? Breast cancer Cousin   ?     maternal first cousin dx in her 61s  ? Melanoma Son   ?     dx in his 67s  ? ? ?Social History:  reports that she has never smoked. She has never used smokeless tobacco. She reports that she does not drink alcohol and does not use drugs. ? ?ROS: ?Pertinent ROS in HPI ? ?Physical Exam: ?BP (!) 157/87   Pulse (!) 101   Ht '5\' 5"'$  (1.651 m)   Wt 150 lb (68 kg)   BMI 24.96 kg/m?   ?Constitutional:  Well nourished. Alert and oriented, No acute distress. ?HEENT: [place] AT, moist mucus membranes.  Trachea midline, no masses. ?Cardiovascular: No clubbing, cyanosis, or edema. ?Respiratory: Normal respiratory effort, no increased work of breathing. ?Neurologic: Grossly intact, no focal deficits, moving all 4 extremities. ?Psychiatric: Normal mood and affect.   ? ?Laboratory Data: ? ?  Latest Ref Rng & Units 12/21/2020  ? 12:15 PM 08/24/2020  ? 11:06 AM 02/24/2020  ? 11:42 AM  ?CMP  ?Glucose 70 - 99 mg/dL 106   91   92    ?BUN 8 - 27 mg/dL '17   18   14    '$ ?Creatinine 0.57 - 1.00 mg/dL 0.64   0.73   0.69    ?Sodium 134 - 144 mmol/L 137   139   133    ?Potassium 3.5 - 5.2 mmol/L 4.1   4.2   3.9    ?Chloride  96 - 106 mmol/L 100   99   97    ?CO2 20 - 29 mmol/L 20  24   29    ?Calcium 8.7 - 10.3 mg/dL 9.7   9.7   9.7    ?Total Protein 6.0 - 8.5 g/dL 7.2      ?Total Bilirubin 0.0 - 1.2 mg/dL 0.5      ?Alkaline Phos 44 - 121 IU/L 92      ?AST 0 - 40 IU/L 27      ?ALT 0 - 32 IU/L 17      ?  ? ?  Latest Ref Rng & Units 12/21/2020  ? 12:15 PM 02/02/2020  ?  9:55 PM 01/13/2020  ? 11:29 AM  ?CBC  ?WBC 3.4 - 10.8 x10E3/uL 6.8   11.4   5.9    ?Hemoglobin 11.1 - 15.9 g/dL 12.7   12.9   12.9    ?Hematocrit 34.0 - 46.6 % 36.7   37.6   37.0    ?Platelets 150 - 450 x10E3/uL 265   296   279    ? Urinalysis ?Component ?    Latest Ref Rng 07/19/2021  ?Specific Gravity, UA ?    1.005 - 1.030  1.020   ?pH, UA ?    5.0 - 7.5  6.0   ?Color, UA ?    Yellow  Yellow   ?Appearance Ur ?    Clear  Cloudy !   ?Leukocytes,UA ?    Negative  3+ !   ?Protein,UA ?    Negative/Trace  Trace !   ?Glucose, UA ?    Negative  Negative   ?Ketones, UA ?    Negative  Negative   ?RBC, UA ?    Negative  1+ !   ?Bilirubin, UA ?    Negative  Negative   ?Urobilinogen, Ur ?    0.2 - 1.0 mg/dL 0.2   ?Nitrite, UA ?    Negative  Negative   ?Microscopic Examination See below:   ? ?Component ?    Latest Ref Rng 07/19/2021  ?RBC ?    0 - 2 /hpf 0-2   ?WBC, UA ?    0 - 5 /hpf >30 !   ?Epithelial Cells (non renal) ?    0 - 10 /hpf 0-10   ?Bacteria, UA ?    None seen/Few  Many !   ?I have reviewed the labs. ? ? ?Pertinent Imaging: ?N/A ? ?Assessment & Plan:   ? ?1. OAB ?-failed anticholinergics ?-failed PTNS  ? ?2. Suspected UTI ?-UA grossly infected ?-urine culture ?-start Keflex ? ?3. Vaginal atrophy ?- continue vaginal estrogen cream three nights weekly  ? ? ?Return if symptoms worsen or fail to improve. ? ?These notes generated with voice recognition software. I apologize for typographical errors. ? ?Moosa Bueche, PA-C ? ?[address] ?[place], [place] [address] ?[phone] ? ? ?

## 2021-07-19 ENCOUNTER — Encounter: Payer: Self-pay | Admitting: Urology

## 2021-07-19 ENCOUNTER — Ambulatory Visit (INDEPENDENT_AMBULATORY_CARE_PROVIDER_SITE_OTHER): Payer: Medicare Other | Admitting: Urology

## 2021-07-19 VITALS — BP 157/87 | HR 101 | Ht 65.0 in | Wt 150.0 lb

## 2021-07-19 DIAGNOSIS — R300 Dysuria: Secondary | ICD-10-CM | POA: Diagnosis not present

## 2021-07-19 DIAGNOSIS — N3281 Overactive bladder: Secondary | ICD-10-CM | POA: Diagnosis not present

## 2021-07-19 DIAGNOSIS — R351 Nocturia: Secondary | ICD-10-CM

## 2021-07-19 DIAGNOSIS — N952 Postmenopausal atrophic vaginitis: Secondary | ICD-10-CM

## 2021-07-19 LAB — URINALYSIS, COMPLETE
Bilirubin, UA: NEGATIVE
Glucose, UA: NEGATIVE
Ketones, UA: NEGATIVE
Nitrite, UA: NEGATIVE
Specific Gravity, UA: 1.02 (ref 1.005–1.030)
Urobilinogen, Ur: 0.2 mg/dL (ref 0.2–1.0)
pH, UA: 6 (ref 5.0–7.5)

## 2021-07-19 LAB — MICROSCOPIC EXAMINATION: WBC, UA: >30 /hpf — AB (ref 0–5)

## 2021-07-19 MED ORDER — CEFUROXIME AXETIL 500 MG PO TABS: 500.0000 mg | ORAL_TABLET | Freq: Two times a day (BID) | ORAL | 0 refills | Status: DC

## 2021-07-19 NOTE — Progress Notes (Signed)
In and Out Catheterization ? ?Patient is present today for a I & O catheterization due to dysuria. Patient was cleaned and prepped in a sterile fashion with betadine . A 14FR cath was inserted no complications were noted , 11m of urine return was noted, urine was cloudy yellow in color. A clean urine sample was collected for urinalysis and culture. Bladder was drained  And catheter was removed with out difficulty.   ? ?Performed by: CBradly BienenstockCMA ? ? ?

## 2021-07-22 LAB — CULTURE, URINE COMPREHENSIVE

## 2021-07-24 ENCOUNTER — Telehealth: Payer: Self-pay

## 2021-07-24 NOTE — Telephone Encounter (Signed)
-----   Message from Nori Riis, PA-C sent at 07/23/2021  3:39 PM EDT ----- ?Please let Mrs. Huwe know that her urine culture was positive for infection and that the Ceftin was correct antibiotic.   ?

## 2021-07-24 NOTE — Telephone Encounter (Signed)
Ok per DPR, [facility] notifying pt of message below. Advised pt to call office should she have any questions.  ?

## 2021-07-25 ENCOUNTER — Ambulatory Visit (INDEPENDENT_AMBULATORY_CARE_PROVIDER_SITE_OTHER): Payer: Medicare Other

## 2021-07-25 VITALS — Wt 150.0 lb

## 2021-07-25 DIAGNOSIS — Z0000 Encounter for general adult medical examination without abnormal findings: Secondary | ICD-10-CM

## 2021-07-25 NOTE — Progress Notes (Signed)
?Virtual Visit via Telephone Note ? ?I connected with  Ashlee Lopez on 07/25/21 at 11:00 AM EDT by telephone and verified that I am speaking with the correct person using two identifiers. ? ?Location: ?Patient: home ?Provider: [facility] ?Persons participating in the virtual visit: patient/Nurse Health Advisor ?  ?I discussed the limitations, risks, security and privacy concerns of performing an evaluation and management service by telephone and the availability of in person appointments. The patient expressed understanding and agreed to proceed. ? ?Interactive audio and video telecommunications were attempted between this nurse and patient, however failed, due to patient having technical difficulties OR patient did not have access to video capability.  We continued and completed visit with audio only. ? ?Some vital signs may be absent or patient reported.  ? ?Dionisio David, LPN ? ?Subjective:  ? Ashlee Lopez is a 88 y.o. female who presents for Medicare Annual (Subsequent) preventive examination. ? ?Review of Systems    ? ?  ? ?   ?Objective:  ?  ?There were no vitals filed for this visit. ?There is no height or weight on file to calculate BMI. ? ? ?  02/02/2020  ?  9:19 PM 12/16/2019  ?  2:08 PM 12/04/2017  ?  3:49 PM 11/21/2016  ?  8:40 AM 09/28/2015  ? 10:16 AM  ?Advanced Directives  ?Does Patient Have a Medical Advance Directive? No Yes Yes Yes Yes  ?Type of Corporate treasurer of [place];Living will [place];Living will [place];Living will [place];Living will  ?Does patient want to make changes to medical advance directive?   No - Patient declined  No - Patient declined  ?Copy of [place] in Chart?  No - copy requested No - copy requested No - copy requested No - copy requested  ?Would patient like information on creating a medical advance directive? No - Patient declined      ? ? ?Current Medications  (verified) ?Outpatient Encounter Medications as of 07/25/2021  ?Medication Sig  ? amLODipine (NORVASC) 10 MG tablet TAKE 1 TABLET BY MOUTH DAILY FOR BLOOD PRESSURE  ? amLODipine (NORVASC) 5 MG tablet Take by mouth.  ? aspirin 81 MG tablet Take 81 mg by mouth daily.  ? aspirin EC 81 MG tablet SMARTSIG:1 By Mouth  ? Calcium Carbonate-Vitamin D (OYSTER CALCIUM 500 + D) 500-5 MG-MCG TABS SMARTSIG:1 By Mouth  ? Calcium Carbonate-Vitamin D 600-200 MG-UNIT TABS Take by mouth.  ? CALCIUM-VITAMIN D PO Take by mouth.  ? cefUROXime (CEFTIN) 500 MG tablet Take 1 tablet (500 mg total) by mouth 2 (two) times daily with a meal.  ? conjugated estrogens (PREMARIN) vaginal cream Apply 0.'5mg'$  (pea-sized amount)  just inside the vaginal introitus with a finger-tip on  Monday, Wednesday and Friday nights.  ? CRANBERRY PO Take 3 tablets by mouth daily.  ? D-MANNOSE PO Take by mouth.  ? Diphenhydramine-APAP, sleep, (TYLENOL PM EXTRA STRENGTH PO) Take by mouth.  ? doxycycline (VIBRAMYCIN) 100 MG capsule Take 100 mg by mouth 2 (two) times daily.  ? hydrochlorothiazide (HYDRODIURIL) 25 MG tablet Take by mouth.  ? ketorolac (ACULAR) 0.5 % ophthalmic solution   ? levothyroxine (SYNTHROID) 100 MCG tablet TAKE 1 TAB BY MOUTH ONCE DAILY. TAKE ON AN EMPTY STOMACH WITH A GLASS OF WATER ATLEAST 30-60 MINUTES BEFORE BREAKFAST  ? losartan (COZAAR) 100 MG tablet TAKE 1 TABLET BY MOUTH ONCE A DAY FOR HIGH BLOOD PRESSURE.  ?  metoprolol tartrate (LOPRESSOR) 50 MG tablet TAKE 1 TABLET BY MOUTH TWICE A DAY for blood pressure.  ? moxifloxacin (VIGAMOX) 0.5 % ophthalmic solution   ? nitrofurantoin, macrocrystal-monohydrate, (MACROBID) 100 MG capsule TAKE 1 TABLET BY MOUTH ONCE A DAY  ? omeprazole (PRILOSEC) 20 MG capsule TAKE 1 CAPSULE BY MOUTH ONCE DAILY FOR HEARTBURN  ? prednisoLONE acetate (PRED FORTE) 1 % ophthalmic suspension   ? RA CRANBERRY 500 MG CAPS SMARTSIG:1 By Mouth  ? rosuvastatin (CRESTOR) 10 MG tablet Take by mouth.  ? rosuvastatin (CRESTOR) 5 MG  tablet Take 1 tablet (5 mg total) by mouth every other day. For cholesterol.  ? TYLENOL 500 MG tablet SMARTSIG:1 By Mouth  ? venlafaxine XR (EFFEXOR-XR) 75 MG 24 hr capsule TAKE 1 CAPSULE BY MOUTH ONCE DAILY WITH BREAKFAST FOR ANXIETY  ? XANAX 0.5 MG tablet SMARTSIG:1 By Mouth  ? ?No facility-administered encounter medications on file as of 07/25/2021.  ? ? ?Allergies (verified) ?Sulfamethoxazole-trimethoprim and Septra [bactrim]  ? ?History: ?Past Medical History:  ?Diagnosis Date  ? Benign essential HTN 03/21/2011  ? Breast cancer, stage 1 ([place]) 02/10/2003  ? Right tubular breast cancer  ? Cancer [facility])   ? Diverticulosis of colon 03/23/2011  ? Family history of breast cancer   ? Family history of colon cancer   ? Family history of melanoma   ? Family history of prostate cancer   ? Fibrocystic disease of breast 03/23/2011  ? Graves' disease with exophthalmos 03/23/2011  ? Hypertension   ? Hypothyroid 03/21/2011  ? IBS (irritable bowel syndrome) 03/23/2011  ? ITP (idiopathic thrombocytopenic purpura) 03/21/2011  ? Nasal fracture 01/2020  ? S/P splenectomy 03/21/2011  ? Uterus cancer ([place]) 03/23/1999  ? 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 Varicose vein of leg 03/23/2011  ? ?Past Surgical History:  ?Procedure Laterality Date  ? ABDOMINAL HYSTERECTOMY  2001  ? APPENDECTOMY  1941  ? BREAST SURGERY  02/17/2003  ? Mastectomy-Right  ? MASTECTOMY  2004  ? Dr Margot Chimes  ? OOPHORECTOMY    ? BSO  ? SKIN CANCER EXCISION  2019  ? removal of cancer from ear  ? SPLENECTOMY  1986  ? ?Family History  ?Problem Relation Age of Onset  ? Breast cancer Mother   ?     Age 4  ? Hypertension Father   ? Heart disease Father   ? Lung cancer Father   ? Breast cancer Sister   ?     Age 44  ? Melanoma Sister   ?     dx in her 72s  ? Dementia Sister   ? Heart disease Maternal Aunt   ? Prostate cancer Maternal Uncle   ? Colon cancer Maternal Grandfather   ? Colon cancer Maternal Uncle   ? Prostate cancer Maternal Uncle   ? Dementia  Maternal Aunt   ? Breast cancer Cousin   ?     maternal 2nd cousin  ? Breast cancer Cousin   ?     maternal first cousin dx in her 41s  ? Melanoma Son   ?     dx in his 35s  ? ?Social History  ? ?Socioeconomic History  ? Marital status: Widowed  ?  Spouse name: Not on file  ? Number of children: Not on file  ? Years of education: Not on file  ? Highest education level: Not on file  ?Occupational History  ? Not on file  ?Tobacco Use  ? Smoking status: Never  ?  Smokeless tobacco: Never  ?Vaping Use  ? Vaping Use: Never used  ?Substance and Sexual Activity  ? Alcohol use: No  ? Drug use: No  ? Sexual activity: Never  ?  Birth control/protection: Surgical  ?Other Topics Concern  ? Not on file  ?Social History Narrative  ? Widow. Lives alone.   ? 3 children, 5 grandchildren.  ? 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  ? Enjoys reading, watching TV.  ? ?Social Determinants of Health  ? ?Financial Resource Strain: Not on file  ?Food Insecurity: Not on file  ?Transportation Needs: Not on file  ?Physical Activity: Not on file  ?Stress: Not on file  ?Social Connections: Not on file  ? ? ?Tobacco Counseling ?Counseling given: Not Answered ? ? ?Clinical Intake: ? ?Pre-visit preparation completed: Yes ? ?Pain : No/denies pain ? ?  ? ?Diabetes: No ? ?How often do you need to have someone help you when you read instructions, pamphlets, or other written materials from your doctor or pharmacy?: 1 - Never ? ?Diabetic?no ? ?  ? ?Information entered by :: Kirke Shaggy, LPN ? ? ?Activities of Daily Living ?   ? View : No data to display.  ?  ?  ?  ? ? ?Patient Care Team: ?Pleas Koch, NP as PCP - General (Internal Medicine) ?Annia Belt, MD (Hematology and Oncology) ?Eula Flax, OD as Referring Physician (Optometry) ?Christene Slates, MD as Consulting Physician (Dermatology) ?Ria Clock, MD as Attending Physician (Radiology) ? ?Indicate any recent Medical Services you may have received from other than [facility] providers  in the past year (date may be approximate). ? ?   ?Assessment:  ? This is a routine wellness examination for [place]. ? ?Hearing/Vision screen ?No results found. ? ?Dietary issues and exercise activities discussed:

## 2021-07-25 NOTE — Patient Instructions (Signed)
Ashlee Lopez , ?Thank you for taking time to come for your Medicare Wellness Visit. I appreciate your ongoing commitment to your health goals. Please review the following plan we discussed and let me know if I can assist you in the future.  ? ?Screening recommendations/referrals: ?Colonoscopy: aged out ?Mammogram: aged out  ?Bone Density: aged out ?Recommended yearly ophthalmology/optometry visit for glaucoma screening and checkup ?Recommended yearly dental visit for hygiene and checkup ? ?Vaccinations: ?Influenza vaccine: had one this past season, doesn't know date ?Pneumococcal vaccine: 09/28/15 ?Tdap vaccine: 02/05/20 ?Shingles vaccine: n/d   ?Covid-19:04/03/19, 04/24/19, 02/17/20, 08/30/20 ? ?Advanced directives: no ? ?Conditions/risks identified: none ? ?Next appointment: Follow up in one year for your annual wellness visit 07/27/22 @ 11am by phone ? ? ?Preventive Care 20 Years and Older, Female ?Preventive care refers to lifestyle choices and visits with your health care provider that can promote health and wellness. ?What does preventive care include? ?A yearly physical exam. This is also called an annual well check. ?Dental exams once or twice a year. ?Routine eye exams. Ask your health care provider how often you should have your eyes checked. ?Personal lifestyle choices, including: ?Daily care of your teeth and gums. ?Regular physical activity. ?Eating a healthy diet. ?Avoiding tobacco and drug use. ?Limiting alcohol use. ?Practicing safe sex. ?Taking low-dose aspirin every day. ?Taking vitamin and mineral supplements as recommended by your health care provider. ?What happens during an annual well check? ?The services and screenings done by your health care provider during your annual well check will depend on your age, overall health, lifestyle risk factors, and family history of disease. ?Counseling  ?Your health care provider may ask you questions about your: ?Alcohol use. ?Tobacco use. ?Drug use. ?Emotional  well-being. ?Home and relationship well-being. ?Sexual activity. ?Eating habits. ?History of falls. ?Memory and ability to understand (cognition). ?Work and work Statistician. ?Reproductive health. ?Screening  ?You may have the following tests or measurements: ?Height, weight, and BMI. ?Blood pressure. ?Lipid and cholesterol levels. These may be checked every 5 years, or more frequently if you are over 41 years old. ?Skin check. ?Lung cancer screening. You may have this screening every year starting at age 44 if you have a 30-pack-year history of smoking and currently smoke or have quit within the past 15 years. ?Fecal occult blood test (FOBT) of the stool. You may have this test every year starting at age 68. ?Flexible sigmoidoscopy or colonoscopy. You may have a sigmoidoscopy every 5 years or a colonoscopy every 10 years starting at age 1. ?Hepatitis C blood test. ?Hepatitis B blood test. ?Sexually transmitted disease (STD) testing. ?Diabetes screening. This is done by checking your blood sugar (glucose) after you have not eaten for a while (fasting). You may have this done every 1-3 years. ?Bone density scan. This is done to screen for osteoporosis. You may have this done starting at age 19. ?Mammogram. This may be done every 1-2 years. Talk to your health care provider about how often you should have regular mammograms. ?Talk with your health care provider about your test results, treatment options, and if necessary, the need for more tests. ?Vaccines  ?Your health care provider may recommend certain vaccines, such as: ?Influenza vaccine. This is recommended every year. ?Tetanus, diphtheria, and acellular pertussis (Tdap, Td) vaccine. You may need a Td booster every 10 years. ?Zoster vaccine. You may need this after age 16. ?Pneumococcal 13-valent conjugate (PCV13) vaccine. One dose is recommended after age 7. ?Pneumococcal polysaccharide (PPSV23) vaccine. One  dose is recommended after age 59. ?Talk to your  health care provider about which screenings and vaccines you need and how often you need them. ?This information is not intended to replace advice given to you by your health care provider. Make sure you discuss any questions you have with your health care provider. ?Document Released: 04/01/2015 Document Revised: 11/23/2015 Document Reviewed: 01/04/2015 ?Elsevier Interactive Patient Education ? 2017 [employer]. ? ?Fall Prevention in the Home ?Falls can cause injuries. They can happen to people of all ages. There are many things you can do to make your home safe and to help prevent falls. ?What can I do on the outside of my home? ?Regularly fix the edges of walkways and driveways and fix any cracks. ?Remove anything that might make you trip as you walk through a door, such as a raised step or threshold. ?Trim any bushes or trees on the path to your home. ?Use bright outdoor lighting. ?Clear any walking paths of anything that might make someone trip, such as rocks or tools. ?Regularly check to see if handrails are loose or broken. Make sure that both sides of any steps have handrails. ?Any raised decks and porches should have guardrails on the edges. ?Have any leaves, snow, or ice cleared regularly. ?Use sand or salt on walking paths during winter. ?Clean up any spills in your garage right away. This includes oil or grease spills. ?What can I do in the bathroom? ?Use night lights. ?Install grab bars by the toilet and in the tub and shower. Do not use towel bars as grab bars. ?Use non-skid mats or decals in the tub or shower. ?If you need to sit down in the shower, use a plastic, non-slip stool. ?Keep the floor dry. Clean up any water that spills on the floor as soon as it happens. ?Remove soap buildup in the tub or shower regularly. ?Attach bath mats securely with double-sided non-slip rug tape. ?Do not have throw rugs and other things on the floor that can make you trip. ?What can I do in the bedroom? ?Use night  lights. ?Make sure that you have a light by your bed that is easy to reach. ?Do not use any sheets or blankets that are too big for your bed. They should not hang down onto the floor. ?Have a firm chair that has side arms. You can use this for support while you get dressed. ?Do not have throw rugs and other things on the floor that can make you trip. ?What can I do in the kitchen? ?Clean up any spills right away. ?Avoid walking on wet floors. ?Keep items that you use a lot in easy-to-reach places. ?If you need to reach something above you, use a strong step stool that has a grab bar. ?Keep electrical cords out of the way. ?Do not use floor polish or wax that makes floors slippery. If you must use wax, use non-skid floor wax. ?Do not have throw rugs and other things on the floor that can make you trip. ?What can I do with my stairs? ?Do not leave any items on the stairs. ?Make sure that there are handrails on both sides of the stairs and use them. Fix handrails that are broken or loose. Make sure that handrails are as long as the stairways. ?Check any carpeting to make sure that it is firmly attached to the stairs. Fix any carpet that is loose or worn. ?Avoid having throw rugs at the top or bottom of the  stairs. If you do have throw rugs, attach them to the floor with carpet tape. ?Make sure that you have a light switch at the top of the stairs and the bottom of the stairs. If you do not have them, ask someone to add them for you. ?What else can I do to help prevent falls? ?Wear shoes that: ?Do not have high heels. ?Have rubber bottoms. ?Are comfortable and fit you well. ?Are closed at the toe. Do not wear sandals. ?If you use a stepladder: ?Make sure that it is fully opened. Do not climb a closed stepladder. ?Make sure that both sides of the stepladder are locked into place. ?Ask someone to hold it for you, if possible. ?Clearly mark and make sure that you can see: ?Any grab bars or handrails. ?First and last  steps. ?Where the edge of each step is. ?Use tools that help you move around (mobility aids) if they are needed. These include: ?Canes. ?Walkers. ?Scooters. ?Crutches. ?Turn on the lights when you go into a dark ar

## 2021-07-31 ENCOUNTER — Telehealth: Payer: Self-pay | Admitting: Family Medicine

## 2021-07-31 NOTE — Telephone Encounter (Signed)
Patient called stating she is still having the same urinary discomfort as she had a couple weeks ago. She finished her abx last Thursday. Appointment has been made.  ?

## 2021-08-01 ENCOUNTER — Ambulatory Visit (INDEPENDENT_AMBULATORY_CARE_PROVIDER_SITE_OTHER): Payer: Medicare Other | Admitting: Physician Assistant

## 2021-08-01 ENCOUNTER — Encounter: Payer: Self-pay | Admitting: Physician Assistant

## 2021-08-01 VITALS — BP 155/83 | HR 91 | Ht 64.0 in | Wt 150.0 lb

## 2021-08-01 DIAGNOSIS — N390 Urinary tract infection, site not specified: Secondary | ICD-10-CM

## 2021-08-01 DIAGNOSIS — N3281 Overactive bladder: Secondary | ICD-10-CM

## 2021-08-01 MED ORDER — NITROFURANTOIN MONOHYD MACRO 100 MG PO CAPS: 100.0000 mg | ORAL_CAPSULE | Freq: Two times a day (BID) | ORAL | 0 refills | Status: AC

## 2021-08-01 NOTE — Patient Instructions (Signed)
If you are interested in discussing having an InterStim device placed, please call our office and I will set you up to see Dr. Matilde Sprang. He comes to our office on Mondays. ?

## 2021-08-01 NOTE — Progress Notes (Signed)
08/01/2021 3:20 PM   Ashlee Lopez 12-19-1933 [id number]  CC: Chief Complaint  Patient presents with   Dysuria   HPI: Ashlee Lopez is a 88 y.o. female with PMH OAB who failed Vesicare and PTNS, vaginal atrophy, IBS, and recurrent UTIs previously on daily suppressive antibiotics who presents today for evaluation of recurrent versus persistent cystitis.   She was seen by Zara Council 13 days ago for evaluation of possible UTI.  Symptoms included dysuria and suprapubic pressure.  UA was notable for pyuria and bacteriuria.  She was treated with empiric Keflex and completed this 5 days ago.  Urine culture finalized with ampicillin, ciprofloxacin, and Bactrim resistant E. coli.  Today she reports her symptoms completely resolved on Keflex but returned 2 days after completing antibiotics.  She is having bothersome dysuria, urgency, and frequency today.  She takes daily cranberry supplements.  She is also curious about InterStim, as someone who attends her church has had a device placed and she did not know this was an option.  She is hesitant to pursue surgery given her age.  She previously was on Myrbetriq 25 mg daily, however she stopped this for unclear reasons.  In-office UA today positive for trace ketones, 1+ blood, 2+ protein, and 3+ leukocyte esterase; urine microscopy with >30 WBCs/HPF, 3-10 RBCs/HPF, and many bacteria.   PMH: Past Medical History:  Diagnosis Date   Benign essential HTN 03/21/2011   Breast cancer, stage 1 ([place]) 02/10/2003   Right tubular breast cancer   Cancer ([place])    Diverticulosis of colon 03/23/2011   Family history of breast cancer    Family history of colon cancer    Family history of melanoma    Family history of prostate cancer    Fibrocystic disease of breast 03/23/2011   Graves' disease with exophthalmos 03/23/2011   Hypertension    Hypothyroid 03/21/2011   IBS (irritable bowel syndrome) 03/23/2011   ITP (idiopathic thrombocytopenic purpura) 03/21/2011    Nasal fracture 01/2020   S/P splenectomy 03/21/2011   Uterus cancer ([place]) 03/23/1999   Well differentiated AdenoCA of endometrium-superficially confined   Varicose vein of leg 03/23/2011    Surgical History: Past Surgical History:  Procedure Laterality Date   ABDOMINAL HYSTERECTOMY  2001   APPENDECTOMY  1941   BREAST SURGERY  02/17/2003   Mastectomy-Right   MASTECTOMY  2004   Dr Margot Chimes   OOPHORECTOMY     BSO   SKIN CANCER EXCISION  2019   removal of cancer from ear   SPLENECTOMY  1986    Home Medications:  Allergies as of 08/01/2021       Reactions   Sulfamethoxazole-trimethoprim Hives   Septra [bactrim]         Medication List        Accurate as of Aug 01, 2021  3:20 PM. If you have any questions, ask your nurse or doctor.          STOP taking these medications    Calcium Carbonate-Vitamin D 600-200 MG-UNIT Tabs Stopped by: Debroah Loop, PA-C   CALCIUM-VITAMIN D PO Stopped by: Debroah Loop, PA-C   doxycycline 100 MG capsule Commonly known as: VIBRAMYCIN Stopped by: Debroah Loop, PA-C   moxifloxacin 0.5 % ophthalmic solution Commonly known as: VIGAMOX Stopped by: Debroah Loop, PA-C   prednisoLONE acetate 1 % ophthalmic suspension Commonly known as: PRED FORTE Stopped by: Debroah Loop, PA-C   Xanax 0.5 MG tablet Generic drug: ALPRAZolam Stopped by: Debroah Loop, PA-C  TAKE these medications    amLODipine 10 MG tablet Commonly known as: NORVASC TAKE 1 TABLET BY MOUTH DAILY FOR BLOOD PRESSURE   aspirin 81 MG tablet Take 81 mg by mouth daily.   cefUROXime 500 MG tablet Commonly known as: CEFTIN Take 1 tablet (500 mg total) by mouth 2 (two) times daily with a meal.   CRANBERRY PO Take 3 tablets by mouth daily.   RA Cranberry 500 MG Caps Generic drug: Cranberry SMARTSIG:1 By Mouth   D-MANNOSE PO Take by mouth.   hydrochlorothiazide 25 MG tablet Commonly known as:  HYDRODIURIL Take by mouth.   ketorolac 0.5 % ophthalmic solution Commonly known as: ACULAR   levothyroxine 100 MCG tablet Commonly known as: SYNTHROID TAKE 1 TAB BY MOUTH ONCE DAILY. TAKE ON AN EMPTY STOMACH WITH A GLASS OF WATER ATLEAST 30-60 MINUTES BEFORE BREAKFAST   losartan 100 MG tablet Commonly known as: COZAAR TAKE 1 TABLET BY MOUTH ONCE A DAY FOR HIGH BLOOD PRESSURE.   metoprolol tartrate 50 MG tablet Commonly known as: LOPRESSOR TAKE 1 TABLET BY MOUTH TWICE A DAY for blood pressure.   nitrofurantoin (macrocrystal-monohydrate) 100 MG capsule Commonly known as: MACROBID Take 1 capsule (100 mg total) by mouth 2 (two) times daily for 7 days. What changed:  how much to take how to take this when to take this additional instructions Changed by: Debroah Loop, PA-C   omeprazole 20 MG capsule Commonly known as: PRILOSEC TAKE 1 CAPSULE BY MOUTH ONCE DAILY FOR HEARTBURN   Premarin vaginal cream Generic drug: conjugated estrogens Apply 0.'5mg'$  (pea-sized amount)  just inside the vaginal introitus with a finger-tip on  Monday, Wednesday and Friday nights.   rosuvastatin 5 MG tablet Commonly known as: CRESTOR Take 1 tablet (5 mg total) by mouth every other day. For cholesterol. What changed: Another medication with the same name was removed. Continue taking this medication, and follow the directions you see here. Changed by: Debroah Loop, PA-C   TYLENOL 500 MG tablet Generic drug: acetaminophen SMARTSIG:1 By Mouth   TYLENOL PM EXTRA STRENGTH PO Take by mouth.   venlafaxine XR 75 MG 24 hr capsule Commonly known as: EFFEXOR-XR TAKE 1 CAPSULE BY MOUTH ONCE DAILY WITH BREAKFAST FOR ANXIETY        Allergies:  Allergies  Allergen Reactions   Sulfamethoxazole-Trimethoprim Hives   Septra [Bactrim]     Family History: Family History  Problem Relation Age of Onset   Breast cancer Mother        Age 42   Hypertension Father    Heart disease Father     Lung cancer Father    Breast cancer Sister        Age 61   Melanoma Sister        dx in her 87s   Dementia Sister    Heart disease Maternal Aunt    Prostate cancer Maternal Uncle    Colon cancer Maternal Grandfather    Colon cancer Maternal Uncle    Prostate cancer Maternal Uncle    Dementia Maternal Aunt    Breast cancer Cousin        maternal 2nd cousin   Breast cancer Cousin        maternal first cousin dx in her 82s   Melanoma Son        dx in his 31s    Social History:   reports that she has never smoked. She has never been exposed to tobacco smoke. She has never used smokeless tobacco.  She reports that she does not drink alcohol and does not use drugs.  Physical Exam: BP (!) 155/83   Pulse 91   Ht '5\' 4"'$  (1.626 m)   Wt 150 lb (68 kg)   BMI 25.75 kg/m   Constitutional:  Alert and oriented, no acute distress, nontoxic appearing HEENT: [place], AT Cardiovascular: No clubbing, cyanosis, or edema Respiratory: Normal respiratory effort, no increased work of breathing Skin: No rashes, bruises or suspicious lesions Neurologic: Grossly intact, no focal deficits, moving all 4 extremities Psychiatric: Normal mood and affect  Laboratory Data: Results for orders placed or performed in visit on 08/01/21  CULTURE, URINE COMPREHENSIVE   Specimen: Urine   UR  Result Value Ref Range   Urine Culture, Comprehensive Final report (A)    Organism ID, Bacteria Escherichia coli (A)    ANTIMICROBIAL SUSCEPTIBILITY Comment   Microscopic Examination   Urine  Result Value Ref Range   WBC, UA >30 (A) 0 - 5 /hpf   RBC 3-10 (A) 0 - 2 /hpf   Epithelial Cells (non renal) 0-10 0 - 10 /hpf   Bacteria, UA Many (A) None seen/Few  Urinalysis, Complete  Result Value Ref Range   Specific Gravity, UA 1.020 1.005 - 1.030   pH, UA 6.5 5.0 - 7.5   Color, UA Yellow Yellow   Appearance Ur Cloudy (A) Clear   Leukocytes,UA 3+ (A) Negative   Protein,UA 2+ (A) Negative/Trace   Glucose, UA Negative  Negative   Ketones, UA Trace (A) Negative   RBC, UA 1+ (A) Negative   Bilirubin, UA Negative Negative   Urobilinogen, Ur 1.0 0.2 - 1.0 mg/dL   Nitrite, UA Negative Negative   Microscopic Examination See below:    Assessment & Plan:   1. Recurrent UTI UA again grossly infected today consistent with recurrent versus persistent UTI.  Will start empiric Macrobid and send for culture for further evaluation.  Given no significant findings on cystoscopy last year, will defer repeat UA to prove resolution of microscopic hematuria.  We discussed that if she starts having frequent UTIs again, we may need to restart suppressive antibiotics. - Urinalysis, Complete - CULTURE, URINE COMPREHENSIVE - nitrofurantoin, macrocrystal-monohydrate, (MACROBID) 100 MG capsule; Take 1 capsule (100 mg total) by mouth 2 (two) times daily for 7 days.  Dispense: 14 capsule; Refill: 0  2. Overactive bladder I offered her a consultation with Dr. Matilde Sprang to discuss InterStim.  She is hesitant to pursue this due to her age.  Counseled her to think this over and let me know if she changes her mind.  Alternatives include Myrbetriq 50 mg or Gemtesa.  Return if symptoms worsen or fail to improve.  Debroah Loop, PA-C  [facility] [address], [place], [place] [address] [phone]

## 2021-08-02 LAB — URINALYSIS, COMPLETE
Bilirubin, UA: NEGATIVE
Glucose, UA: NEGATIVE
Nitrite, UA: NEGATIVE
Specific Gravity, UA: 1.02 (ref 1.005–1.030)
Urobilinogen, Ur: 1 mg/dL (ref 0.2–1.0)
pH, UA: 6.5 (ref 5.0–7.5)

## 2021-08-02 LAB — MICROSCOPIC EXAMINATION: WBC, UA: >30 /hpf — AB (ref 0–5)

## 2021-08-04 LAB — CULTURE, URINE COMPREHENSIVE

## 2021-08-15 ENCOUNTER — Other Ambulatory Visit: Payer: Self-pay | Admitting: Primary Care

## 2021-08-15 DIAGNOSIS — E785 Hyperlipidemia, unspecified: Secondary | ICD-10-CM

## 2021-08-16 DIAGNOSIS — Z1231 Encounter for screening mammogram for malignant neoplasm of breast: Secondary | ICD-10-CM | POA: Diagnosis not present

## 2021-08-16 LAB — HM MAMMOGRAPHY

## 2021-08-18 ENCOUNTER — Encounter: Payer: Self-pay | Admitting: Primary Care

## 2021-08-28 ENCOUNTER — Other Ambulatory Visit: Payer: Self-pay | Admitting: Primary Care

## 2021-08-28 DIAGNOSIS — I10 Essential (primary) hypertension: Secondary | ICD-10-CM

## 2021-08-28 NOTE — Telephone Encounter (Signed)
Patient is due for follow up in October, please schedule.

## 2021-08-29 ENCOUNTER — Other Ambulatory Visit: Payer: Self-pay | Admitting: Primary Care

## 2021-08-29 DIAGNOSIS — I10 Essential (primary) hypertension: Secondary | ICD-10-CM

## 2021-08-29 NOTE — Telephone Encounter (Signed)
Called patient appointment made for follow up. No further auction needed.

## 2021-08-30 ENCOUNTER — Ambulatory Visit: Payer: Medicare Other | Admitting: Podiatry

## 2021-08-30 ENCOUNTER — Ambulatory Visit (INDEPENDENT_AMBULATORY_CARE_PROVIDER_SITE_OTHER): Payer: Medicare Other | Admitting: Physician Assistant

## 2021-08-30 VITALS — BP 135/78 | HR 87 | Ht 64.0 in | Wt 150.0 lb

## 2021-08-30 DIAGNOSIS — R351 Nocturia: Secondary | ICD-10-CM

## 2021-08-30 DIAGNOSIS — N390 Urinary tract infection, site not specified: Secondary | ICD-10-CM

## 2021-08-30 LAB — URINALYSIS, COMPLETE
Bilirubin, UA: NEGATIVE
Glucose, UA: NEGATIVE
Nitrite, UA: NEGATIVE
Specific Gravity, UA: 1.02 (ref 1.005–1.030)
Urobilinogen, Ur: 0.2 mg/dL (ref 0.2–1.0)
pH, UA: 6 (ref 5.0–7.5)

## 2021-08-30 LAB — MICROSCOPIC EXAMINATION: WBC, UA: >30 /hpf — AB (ref 0–5)

## 2021-08-30 MED ORDER — NITROFURANTOIN MONOHYD MACRO 100 MG PO CAPS: 100.0000 mg | ORAL_CAPSULE | Freq: Two times a day (BID) | ORAL | 0 refills | Status: AC

## 2021-08-30 MED ORDER — MIRABEGRON ER 50 MG PO TB24: 50.0000 mg | ORAL_TABLET | Freq: Every day | ORAL | 0 refills | Status: DC

## 2021-08-30 NOTE — Progress Notes (Signed)
08/30/2021 3:49 PM   Ashlee Lopez 09-Sep-1933 [id number]  CC: Chief Complaint  Patient presents with   Recurrent UTI   HPI: Ashlee Lopez is a 88 y.o. female with PMH OAB who failed Vesicare, Gemtesa, and PTNS, nocturia previously recommended to undergo sleep study, vaginal atrophy, IBS, and recurrent UTIs previously on daily suppressive antibiotics who presents today for evaluation of possible UTI.   Today she reports her symptoms resolved after I treated her for UTI last month.  1 week ago, she noted increased nocturia every 1-2 hours, urinary leakage when bending over, and uncomfortable pressure and urgency with urination.  With regard to suppressive antibiotics, I started her on daily Macrobid in September 2021 and she discontinued this sometime before April 2022.  Additionally, she previously took Myrbetriq 25 mg daily and reported some slight symptomatic improvement on this.  In-office catheterized UA today positive for trace ketones, trace intact blood, trace protein, and 2+ leukocyte esterase; urine microscopy with >30 WBCs/HPF, 3-10 RBCs/HPF, and many bacteria.   PMH: Past Medical History:  Diagnosis Date   Benign essential HTN 03/21/2011   Breast cancer, stage 1 ([place]) 02/10/2003   Right tubular breast cancer   Cancer ([place])    Diverticulosis of colon 03/23/2011   Family history of breast cancer    Family history of colon cancer    Family history of melanoma    Family history of prostate cancer    Fibrocystic disease of breast 03/23/2011   Graves' disease with exophthalmos 03/23/2011   Hypertension    Hypothyroid 03/21/2011   IBS (irritable bowel syndrome) 03/23/2011   ITP (idiopathic thrombocytopenic purpura) 03/21/2011   Nasal fracture 01/2020   S/P splenectomy 03/21/2011   Uterus cancer ([place]) 03/23/1999   Well differentiated AdenoCA of endometrium-superficially confined   Varicose vein of leg 03/23/2011    Surgical History: Past Surgical History:  Procedure Laterality Date    ABDOMINAL HYSTERECTOMY  2001   APPENDECTOMY  1941   BREAST SURGERY  02/17/2003   Mastectomy-Right   MASTECTOMY  2004   Dr Margot Chimes   OOPHORECTOMY     BSO   SKIN CANCER EXCISION  2019   removal of cancer from ear   SPLENECTOMY  1986    Home Medications:  Allergies as of 08/30/2021       Reactions   Sulfamethoxazole-trimethoprim Hives   Septra [bactrim]         Medication List        Accurate as of August 30, 2021  3:49 PM. If you have any questions, ask your nurse or doctor.          STOP taking these medications    cefUROXime 500 MG tablet Commonly known as: CEFTIN       TAKE these medications    amLODipine 10 MG tablet Commonly known as: NORVASC TAKE 1 TABLET BY MOUTH DAILY FOR BLOOD PRESSURE   aspirin 81 MG tablet Take 81 mg by mouth daily.   CRANBERRY PO Take 3 tablets by mouth daily.   RA Cranberry 500 MG Caps Generic drug: Cranberry SMARTSIG:1 By Mouth   D-MANNOSE PO Take by mouth.   hydrochlorothiazide 25 MG tablet Commonly known as: HYDRODIURIL Take by mouth.   ketorolac 0.5 % ophthalmic solution Commonly known as: ACULAR   levothyroxine 100 MCG tablet Commonly known as: SYNTHROID TAKE 1 TAB BY MOUTH ONCE DAILY. TAKE ON AN EMPTY STOMACH WITH A GLASS OF WATER ATLEAST 30-60 MINUTES BEFORE BREAKFAST   losartan 100 MG  tablet Commonly known as: COZAAR TAKE 1 TABLET BY MOUTH ONCE A DAY FOR HIGH BLOOD PRESSURE.   metoprolol tartrate 50 MG tablet Commonly known as: LOPRESSOR TAKE 1 TABLET BY MOUTH TWICE A DAY   omeprazole 20 MG capsule Commonly known as: PRILOSEC TAKE 1 CAPSULE BY MOUTH ONCE DAILY FOR HEARTBURN   Premarin vaginal cream Generic drug: conjugated estrogens Apply 0.'5mg'$  (pea-sized amount)  just inside the vaginal introitus with a finger-tip on  Monday, Wednesday and Friday nights.   rosuvastatin 5 MG tablet Commonly known as: CRESTOR Take 1 tablet (5 mg total) by mouth every other day. For cholesterol.   TYLENOL 500 MG  tablet Generic drug: acetaminophen SMARTSIG:1 By Mouth   TYLENOL PM EXTRA STRENGTH PO Take by mouth.   venlafaxine XR 75 MG 24 hr capsule Commonly known as: EFFEXOR-XR TAKE 1 CAPSULE BY MOUTH ONCE DAILY WITH BREAKFAST FOR ANXIETY        Allergies:  Allergies  Allergen Reactions   Sulfamethoxazole-Trimethoprim Hives   Septra [Bactrim]     Family History: Family History  Problem Relation Age of Onset   Breast cancer Mother        Age 8   Hypertension Father    Heart disease Father    Lung cancer Father    Breast cancer Sister        Age 89   Melanoma Sister        dx in her 29s   Dementia Sister    Heart disease Maternal Aunt    Prostate cancer Maternal Uncle    Colon cancer Maternal Grandfather    Colon cancer Maternal Uncle    Prostate cancer Maternal Uncle    Dementia Maternal Aunt    Breast cancer Cousin        maternal 2nd cousin   Breast cancer Cousin        maternal first cousin dx in her 79s   Melanoma Son        dx in his 21s    Social History:   reports that she has never smoked. She has never been exposed to tobacco smoke. She has never used smokeless tobacco. She reports that she does not drink alcohol and does not use drugs.  Physical Exam: BP 135/78   Pulse 87   Ht '5\' 4"'$  (1.626 m)   Wt 150 lb (68 kg)   BMI 25.75 kg/m   Constitutional:  Alert and oriented, no acute distress, nontoxic appearing HEENT: [place], AT Cardiovascular: No clubbing, cyanosis, or edema Respiratory: Normal respiratory effort, no increased work of breathing Skin: No rashes, bruises or suspicious lesions Neurologic: Grossly intact, no focal deficits, moving all 4 extremities Psychiatric: Normal mood and affect  Laboratory Data: Results for orders placed or performed in visit on 08/30/21  Microscopic Examination   Urine  Result Value Ref Range   WBC, UA >30 (A) 0 - 5 /hpf   RBC 3-10 (A) 0 - 2 /hpf   Epithelial Cells (non renal) 0-10 0 - 10 /hpf   Bacteria, UA Many  (A) None seen/Few  Urinalysis, Complete  Result Value Ref Range   Specific Gravity, UA 1.020 1.005 - 1.030   pH, UA 6.0 5.0 - 7.5   Color, UA Yellow Yellow   Appearance Ur Cloudy (A) Clear   Leukocytes,UA 2+ (A) Negative   Protein,UA Trace (A) Negative/Trace   Glucose, UA Negative Negative   Ketones, UA Trace (A) Negative   RBC, UA Trace (A) Negative   Bilirubin,  UA Negative Negative   Urobilinogen, Ur 0.2 0.2 - 1.0 mg/dL   Nitrite, UA Negative Negative   Microscopic Examination See below:    Assessment & Plan:   1. Recurrent UTI Cath UA appears grossly infected again today, though her symptoms are more storage related and not irritative.  I am starting to suspect that she may be chronically colonized with concurrent OAB.  Unfortunately, she has failed multiple OAB therapies at this point disorder treatment options are limited.  We will go ahead and treat her empirically with a treatment course of Macrobid and consider resuming suppressive antibiotics per Myrbetriq trial as below. - Urinalysis, Complete - CULTURE, URINE COMPREHENSIVE - nitrofurantoin, macrocrystal-monohydrate, (MACROBID) 100 MG capsule; Take 1 capsule (100 mg total) by mouth 2 (two) times daily for 7 days.  Dispense: 14 capsule; Refill: 0  2. Nocturia I recommended starting a trial of Myrbetriq 50 mg daily to help with her overactivity symptoms and she agreed.  Unfortunately, she has failed most other therapy options and does not wish to pursue InterStim.  If she fails Myrbetriq, may resume suppressive Macrobid to down regulate her bladder and overactivity. - mirabegron ER (MYRBETRIQ) 50 MG TB24 tablet; Take 1 tablet (50 mg total) by mouth daily.  Dispense: 28 tablet; Refill: 0   Return in about 4 weeks (around 09/27/2021) for Symptom recheck, cath UA, and PVR.  Debroah Loop, PA-C  [facility] [address] [place], [place] [address] [phone]

## 2021-08-30 NOTE — Patient Instructions (Signed)
Tracking Your Bladder Symptoms    Patient Name:___________________________________________________   Sample: Day   Daytime Voids  Nighttime Voids Urgency for the Day(0-4) Number of Accidents Beverage Comments  Monday IIII II 2 I Water IIII Coffee  I      Week Starting:____________________________________   Day Daytime  Voids Nighttime  Voids Urgency for  The Day(0-4) Number of Accidents Beverages Comments                                                            Week Starting:____________________________________   Day Daytime  Voids Nighttime  Voids Urgency for  The Day(0-4) Number of Accidents Beverages Comments                                                           This week my symptoms were:  O much better  O better O the same O worse

## 2021-08-30 NOTE — Progress Notes (Signed)
In and Out Catheterization  Patient is present today for a I & O catheterization due to rUTI. Patient was cleaned and prepped in a sterile fashion with betadine . A 14FR cath was inserted no complications were noted , 17m of urine return was noted, urine was cloudy dark yellow in color. A clean urine sample was collected for urinalysis & culture. Bladder was drained  And catheter was removed with out difficulty.    Performed by: CBradly BienenstockCMA

## 2021-09-03 LAB — CULTURE, URINE COMPREHENSIVE

## 2021-09-05 ENCOUNTER — Ambulatory Visit (INDEPENDENT_AMBULATORY_CARE_PROVIDER_SITE_OTHER): Payer: Medicare Other | Admitting: Podiatry

## 2021-09-05 ENCOUNTER — Encounter: Payer: Self-pay | Admitting: Podiatry

## 2021-09-05 DIAGNOSIS — B351 Tinea unguium: Secondary | ICD-10-CM

## 2021-09-05 DIAGNOSIS — M79676 Pain in unspecified toe(s): Secondary | ICD-10-CM | POA: Diagnosis not present

## 2021-09-05 NOTE — Progress Notes (Signed)
Presents today chief complaint of painful elongated toenails.  Objective: Toenails are long thick yellow dystrophic onychomycotic and painful.  Pulses remain palpable.  No open lesions or wounds.  Assessment: Pain in limb secondary onychomycosis.  Plan: Debridement of toenails 1 through 5 bilateral.

## 2021-09-11 ENCOUNTER — Other Ambulatory Visit: Payer: Self-pay

## 2021-09-11 ENCOUNTER — Other Ambulatory Visit: Payer: Self-pay | Admitting: Orthopedic Surgery

## 2021-09-11 DIAGNOSIS — M4800 Spinal stenosis, site unspecified: Secondary | ICD-10-CM

## 2021-09-11 DIAGNOSIS — M48061 Spinal stenosis, lumbar region without neurogenic claudication: Secondary | ICD-10-CM | POA: Diagnosis not present

## 2021-09-11 DIAGNOSIS — M25512 Pain in left shoulder: Secondary | ICD-10-CM | POA: Diagnosis not present

## 2021-09-23 ENCOUNTER — Ambulatory Visit
Admission: RE | Admit: 2021-09-23 | Discharge: 2021-09-23 | Disposition: A | Discharge status: Home / Self Care | Payer: Medicare Other | Source: Ambulatory Visit | Attending: Orthopedic Surgery | Admitting: Orthopedic Surgery

## 2021-09-23 DIAGNOSIS — M4800 Spinal stenosis, site unspecified: Secondary | ICD-10-CM

## 2021-09-23 DIAGNOSIS — M48061 Spinal stenosis, lumbar region without neurogenic claudication: Secondary | ICD-10-CM | POA: Diagnosis not present

## 2021-09-25 DIAGNOSIS — M25562 Pain in left knee: Secondary | ICD-10-CM | POA: Diagnosis not present

## 2021-09-27 ENCOUNTER — Ambulatory Visit: Payer: Medicare Other | Admitting: Physician Assistant

## 2021-10-03 ENCOUNTER — Ambulatory Visit (INDEPENDENT_AMBULATORY_CARE_PROVIDER_SITE_OTHER): Payer: Medicare Other | Admitting: Physician Assistant

## 2021-10-03 ENCOUNTER — Encounter: Payer: Self-pay | Admitting: Physician Assistant

## 2021-10-03 VITALS — BP 161/81 | HR 91 | Ht 64.0 in | Wt 155.0 lb

## 2021-10-03 DIAGNOSIS — N3090 Cystitis, unspecified without hematuria: Secondary | ICD-10-CM | POA: Diagnosis not present

## 2021-10-03 DIAGNOSIS — N390 Urinary tract infection, site not specified: Secondary | ICD-10-CM | POA: Diagnosis not present

## 2021-10-03 LAB — URINALYSIS, COMPLETE
Bilirubin, UA: NEGATIVE
Glucose, UA: NEGATIVE
Nitrite, UA: NEGATIVE
Protein,UA: NEGATIVE
Specific Gravity, UA: 1.015 (ref 1.005–1.030)
Urobilinogen, Ur: 0.2 mg/dL (ref 0.2–1.0)
pH, UA: 6.5 (ref 5.0–7.5)

## 2021-10-03 LAB — MICROSCOPIC EXAMINATION: WBC, UA: >30 /hpf — AB (ref 0–5)

## 2021-10-03 MED ORDER — NITROFURANTOIN MONOHYD MACRO 100 MG PO CAPS: 100.0000 mg | ORAL_CAPSULE | Freq: Every day | ORAL | 2 refills | Status: DC

## 2021-10-03 NOTE — Patient Instructions (Signed)
We are going to restart daily Macrobid for the next 3 months to reduce bladder inflammation that could be contributing to your frequent, urgent urination.  I'm sending your urine for culture today; if you develop UTI symptoms please call me and we'll start a treatment course of antibiotics.  You may stop Myrbetriq; please continue estrogen cream 3 times weekly.

## 2021-10-06 ENCOUNTER — Telehealth: Payer: Self-pay

## 2021-10-06 DIAGNOSIS — N390 Urinary tract infection, site not specified: Secondary | ICD-10-CM

## 2021-10-06 LAB — CULTURE, URINE COMPREHENSIVE

## 2021-10-06 MED ORDER — CEFUROXIME AXETIL 250 MG PO TABS: 250.0000 mg | ORAL_TABLET | Freq: Two times a day (BID) | ORAL | 0 refills | Status: AC

## 2021-10-06 NOTE — Telephone Encounter (Signed)
-----   Message from [place], [place] sent at 10/06/2021  4:03 PM EDT ----- Please interrupt her daily suppressive Macrobid for a treatment course of cefuroxime 250 mg twice daily x7 days.  She should resume Macrobid upon completion of the treatment course of antibiotics.  Please keep scheduled follow-up with Dr. Matilde Sprang.

## 2021-10-06 NOTE — Telephone Encounter (Signed)
Called pt informed pt of the information below. RX sent. Pt voiced understanding.

## 2021-10-06 NOTE — Progress Notes (Signed)
10/03/2021 3:45 PM   Ashlee Lopez 25-Jul-1933 [id number]  CC: Chief Complaint  Patient presents with   Recurrent UTI   HPI: Ashlee Lopez is a 88 y.o. female with PMH OAB who failed Vesicare, Gemtesa, and PTNS; nocturia previously recommended to undergo sleep study; vaginal atrophy; IBS; and recurrent UTIs previously on daily suppressive antibiotics who presents today for recheck on Myrbetriq.   Today she reports no change in her urinary symptoms on Myrbetriq.  She brings a voiding diary with her today, which shows daytime frequency x7-8, nocturia x4-6, variable urgency, and accidents x1-5.  In retrospect, she thinks the only medication that has really helped her with her urgency and frequency is daily suppressive Macrobid.  In-office catheterized UA today positive for trace ketones, trace intact blood, and 3+ leukocyte esterase; urine microscopy with >30 WBCs/HPF, 3-10 RBCs/HPF, and many bacteria.  PMH: Past Medical History:  Diagnosis Date   Benign essential HTN 03/21/2011   Breast cancer, stage 1 ([place]) 02/10/2003   Right tubular breast cancer   Cancer ([place])    Diverticulosis of colon 03/23/2011   Family history of breast cancer    Family history of colon cancer    Family history of melanoma    Family history of prostate cancer    Fibrocystic disease of breast 03/23/2011   Graves' disease with exophthalmos 03/23/2011   Hypertension    Hypothyroid 03/21/2011   IBS (irritable bowel syndrome) 03/23/2011   ITP (idiopathic thrombocytopenic purpura) 03/21/2011   Nasal fracture 01/2020   S/P splenectomy 03/21/2011   Uterus cancer ([place]) 03/23/1999   Well differentiated AdenoCA of endometrium-superficially confined   Varicose vein of leg 03/23/2011    Surgical History: Past Surgical History:  Procedure Laterality Date   ABDOMINAL HYSTERECTOMY  2001   APPENDECTOMY  1941   BREAST SURGERY  02/17/2003   Mastectomy-Right   MASTECTOMY  2004   Dr Margot Chimes   OOPHORECTOMY     BSO   SKIN CANCER  EXCISION  2019   removal of cancer from ear   SPLENECTOMY  1986    Home Medications:  Allergies as of 10/03/2021       Reactions   Sulfamethoxazole-trimethoprim Hives   Septra [bactrim]         Medication List        Accurate as of October 03, 2021 11:59 PM. If you have any questions, ask your nurse or doctor.          STOP taking these medications    mirabegron ER 50 MG Tb24 tablet Commonly known as: MYRBETRIQ Stopped by: Debroah Loop, PA-C       TAKE these medications    amLODipine 10 MG tablet Commonly known as: NORVASC TAKE 1 TABLET BY MOUTH DAILY FOR BLOOD PRESSURE   aspirin 81 MG tablet Take 81 mg by mouth daily.   CRANBERRY PO Take 3 tablets by mouth daily.   RA Cranberry 500 MG Caps Generic drug: Cranberry SMARTSIG:1 By Mouth   D-MANNOSE PO Take by mouth.   hydrochlorothiazide 25 MG tablet Commonly known as: HYDRODIURIL Take by mouth.   ketorolac 0.5 % ophthalmic solution Commonly known as: ACULAR   levothyroxine 100 MCG tablet Commonly known as: SYNTHROID TAKE 1 TAB BY MOUTH ONCE DAILY. TAKE ON AN EMPTY STOMACH WITH A GLASS OF WATER ATLEAST 30-60 MINUTES BEFORE BREAKFAST   losartan 100 MG tablet Commonly known as: COZAAR TAKE 1 TABLET BY MOUTH ONCE A DAY FOR HIGH BLOOD PRESSURE.   metoprolol tartrate  50 MG tablet Commonly known as: LOPRESSOR TAKE 1 TABLET BY MOUTH TWICE A DAY   nitrofurantoin (macrocrystal-monohydrate) 100 MG capsule Commonly known as: MACROBID Take 1 capsule (100 mg total) by mouth daily. Started by: Debroah Loop, PA-C   omeprazole 20 MG capsule Commonly known as: PRILOSEC TAKE 1 CAPSULE BY MOUTH ONCE DAILY FOR HEARTBURN   Premarin vaginal cream Generic drug: conjugated estrogens Apply 0.'5mg'$  (pea-sized amount)  just inside the vaginal introitus with a finger-tip on  Monday, Wednesday and Friday nights.   rosuvastatin 5 MG tablet Commonly known as: CRESTOR Take 1 tablet (5 mg total) by mouth  every other day. For cholesterol.   TYLENOL 500 MG tablet Generic drug: acetaminophen SMARTSIG:1 By Mouth   TYLENOL PM EXTRA STRENGTH PO Take by mouth.   venlafaxine XR 75 MG 24 hr capsule Commonly known as: EFFEXOR-XR TAKE 1 CAPSULE BY MOUTH ONCE DAILY WITH BREAKFAST FOR ANXIETY        Allergies:  Allergies  Allergen Reactions   Sulfamethoxazole-Trimethoprim Hives   Septra [Bactrim]     Family History: Family History  Problem Relation Age of Onset   Breast cancer Mother        Age 47   Hypertension Father    Heart disease Father    Lung cancer Father    Breast cancer Sister        Age 42   Melanoma Sister        dx in her 16s   Dementia Sister    Heart disease Maternal Aunt    Prostate cancer Maternal Uncle    Colon cancer Maternal Grandfather    Colon cancer Maternal Uncle    Prostate cancer Maternal Uncle    Dementia Maternal Aunt    Breast cancer Cousin        maternal 2nd cousin   Breast cancer Cousin        maternal first cousin dx in her 82s   Melanoma Son        dx in his 56s    Social History:   reports that she has never smoked. She has never been exposed to tobacco smoke. She has never used smokeless tobacco. She reports that she does not drink alcohol and does not use drugs.  Physical Exam: BP (!) 161/81   Pulse 91   Ht '5\' 4"'$  (1.626 m)   Wt 155 lb (70.3 kg)   BMI 26.61 kg/m   Constitutional:  Alert and oriented, no acute distress, nontoxic appearing HEENT: [place], AT Cardiovascular: No clubbing, cyanosis, or edema Respiratory: Normal respiratory effort, no increased work of breathing Skin: No rashes, bruises or suspicious lesions Neurologic: Grossly intact, no focal deficits, moving all 4 extremities Psychiatric: Normal mood and affect  Laboratory Data: Results for orders placed or performed in visit on 10/03/21  CULTURE, URINE COMPREHENSIVE   Specimen: Urine   UR  Result Value Ref Range   Urine Culture, Comprehensive Final report (A)     Organism ID, Bacteria Comment (A)    ANTIMICROBIAL SUSCEPTIBILITY Comment   Microscopic Examination   Urine  Result Value Ref Range   WBC, UA >30 (A) 0 - 5 /hpf   RBC, Urine 3-10 (A) 0 - 2 /hpf   Epithelial Cells (non renal) 0-10 0 - 10 /hpf   Bacteria, UA Many (A) None seen/Few  Urinalysis, Complete  Result Value Ref Range   Specific Gravity, UA 1.015 1.005 - 1.030   pH, UA 6.5 5.0 - 7.5   Color,  UA Yellow Yellow   Appearance Ur Cloudy (A) Clear   Leukocytes,UA 3+ (A) Negative   Protein,UA Negative Negative/Trace   Glucose, UA Negative Negative   Ketones, UA Trace (A) Negative   RBC, UA Trace (A) Negative   Bilirubin, UA Negative Negative   Urobilinogen, Ur 0.2 0.2 - 1.0 mg/dL   Nitrite, UA Negative Negative   Microscopic Examination See below:    Assessment & Plan:   1. Cystitis No symptomatic improvement on Myrbetriq.  The only medication that is given her symptomatic improvement is daily suppressive Macrobid.  Her urine remains grossly infected appearing today.  I had previously suspected a possible asymptomatic bacteriuria with concurrent OAB, though given symptomatic improvement on Macrobid this is more suggestive of a chronic cystitis picture.  We will send her urine for culture today and start 3 months of suppressive Macrobid to down regulate her bladder.  Given her complex picture, I would like her to see Dr. Matilde Sprang to get his opinion.  She is in agreement with this plan. - Bladder Scan (Post Void Residual) in office - Urinalysis, Complete - nitrofurantoin, macrocrystal-monohydrate, (MACROBID) 100 MG capsule; Take 1 capsule (100 mg total) by mouth daily.  Dispense: 30 capsule; Refill: 2 - CULTURE, URINE COMPREHENSIVE  Return in about 3 months (around 01/03/2022) for Chronic cystitis vs OAB/asymptomatic bacteriuria eval (on Macrobid) with Dr. Matilde Sprang.  Debroah Loop, PA-C  [facility] [address], [place], [place] [address] [phone]

## 2021-11-07 ENCOUNTER — Other Ambulatory Visit: Payer: Self-pay | Admitting: Primary Care

## 2021-11-07 DIAGNOSIS — I10 Essential (primary) hypertension: Secondary | ICD-10-CM

## 2021-11-07 DIAGNOSIS — E039 Hypothyroidism, unspecified: Secondary | ICD-10-CM

## 2021-11-07 DIAGNOSIS — F411 Generalized anxiety disorder: Secondary | ICD-10-CM

## 2021-11-07 DIAGNOSIS — E785 Hyperlipidemia, unspecified: Secondary | ICD-10-CM

## 2021-11-27 ENCOUNTER — Other Ambulatory Visit: Payer: Self-pay | Admitting: Primary Care

## 2021-11-27 DIAGNOSIS — I10 Essential (primary) hypertension: Secondary | ICD-10-CM

## 2021-12-06 ENCOUNTER — Encounter: Payer: Self-pay | Admitting: Podiatry

## 2021-12-06 ENCOUNTER — Ambulatory Visit (INDEPENDENT_AMBULATORY_CARE_PROVIDER_SITE_OTHER): Payer: Medicare Other | Admitting: Podiatry

## 2021-12-06 DIAGNOSIS — M79676 Pain in unspecified toe(s): Secondary | ICD-10-CM | POA: Diagnosis not present

## 2021-12-06 DIAGNOSIS — B351 Tinea unguium: Secondary | ICD-10-CM | POA: Diagnosis not present

## 2021-12-06 NOTE — Progress Notes (Signed)
She presents today chief complaint of painful elongated toenails.  Toenails are long and they are bother me.  Objective: Vital signs are stable she alert oriented x3 there is no erythema edema cellulitis drainage or odor toenails are long thick yellow dystrophic with mycotic painful palpation.  Assessment: Pain in limb secondary to onychomycosis.  Plan: Debridement of toenails 1 through secondary to pain follow-up with her in 3 months

## 2021-12-13 DIAGNOSIS — Z6826 Body mass index (BMI) 26.0-26.9, adult: Secondary | ICD-10-CM | POA: Diagnosis not present

## 2021-12-13 DIAGNOSIS — M4126 Other idiopathic scoliosis, lumbar region: Secondary | ICD-10-CM | POA: Diagnosis not present

## 2021-12-26 DIAGNOSIS — H903 Sensorineural hearing loss, bilateral: Secondary | ICD-10-CM | POA: Diagnosis not present

## 2021-12-26 DIAGNOSIS — H838X3 Other specified diseases of inner ear, bilateral: Secondary | ICD-10-CM | POA: Diagnosis not present

## 2022-01-01 ENCOUNTER — Encounter: Payer: Self-pay | Admitting: Urology

## 2022-01-01 ENCOUNTER — Ambulatory Visit (INDEPENDENT_AMBULATORY_CARE_PROVIDER_SITE_OTHER): Payer: Medicare Other | Admitting: Urology

## 2022-01-01 VITALS — BP 172/94 | HR 84 | Ht 64.0 in | Wt 155.0 lb

## 2022-01-01 DIAGNOSIS — N3946 Mixed incontinence: Secondary | ICD-10-CM | POA: Diagnosis not present

## 2022-01-01 DIAGNOSIS — N3090 Cystitis, unspecified without hematuria: Secondary | ICD-10-CM

## 2022-01-01 DIAGNOSIS — N3944 Nocturnal enuresis: Secondary | ICD-10-CM

## 2022-01-01 DIAGNOSIS — R600 Localized edema: Secondary | ICD-10-CM | POA: Diagnosis not present

## 2022-01-01 DIAGNOSIS — N3281 Overactive bladder: Secondary | ICD-10-CM | POA: Diagnosis not present

## 2022-01-01 MED ORDER — NITROFURANTOIN MONOHYD MACRO 100 MG PO CAPS: 100.0000 mg | ORAL_CAPSULE | Freq: Every day | ORAL | 2 refills | Status: DC

## 2022-01-01 MED ORDER — OXYBUTYNIN CHLORIDE ER 10 MG PO TB24: 10.0000 mg | ORAL_TABLET | Freq: Every day | ORAL | 11 refills | Status: DC

## 2022-01-01 NOTE — Progress Notes (Signed)
01/01/2022 1:46 PM   Ashlee Lopez 05/11/1933 [id number]  Referring provider: Pleas Koch, NP [place],  [place] [address]  Chief Complaint  Patient presents with   Cystitis    HPI: Other providers: Patient has failed Vesicare Gemtesa and percutaneous tibial nerve stimulation.  On suppressive antibiotics and Myrbetriq and sleep study for nocturia.  She believes the daily Macrobid is helped her in the past.  Negative cystoscopy May 2022  Patient voids 5-6 times a night.  She does have ankle edema.  She does not take a diuretic.  She voids every 1-2 hours during the day.  She wears 4 pads a day and uses tissue as a double padding system.  Sometimes damp sometimes soaked  She is not a strong historian by do not think she is getting a lot of bladder infections recently.  She has a history of spinal stenosis.  Currently on Macrobid once a day.  She could not leave a specimen  No previous bladder surgery.   PMH: Past Medical History:  Diagnosis Date   Benign essential HTN 03/21/2011   Breast cancer, stage 1 ([place]) 02/10/2003   Right tubular breast cancer   Cancer ([place])    Diverticulosis of colon 03/23/2011   Family history of breast cancer    Family history of colon cancer    Family history of melanoma    Family history of prostate cancer    Fibrocystic disease of breast 03/23/2011   Graves' disease with exophthalmos 03/23/2011   Hypertension    Hypothyroid 03/21/2011   IBS (irritable bowel syndrome) 03/23/2011   ITP (idiopathic thrombocytopenic purpura) 03/21/2011   Nasal fracture 01/2020   S/P splenectomy 03/21/2011   Uterus cancer ([place]) 03/23/1999   Well differentiated AdenoCA of endometrium-superficially confined   Varicose vein of leg 03/23/2011    Surgical History: Past Surgical History:  Procedure Laterality Date   ABDOMINAL HYSTERECTOMY  2001   APPENDECTOMY  1941   BREAST SURGERY  02/17/2003   Mastectomy-Right   MASTECTOMY  2004   Dr Margot Chimes   OOPHORECTOMY      BSO   SKIN CANCER EXCISION  2019   removal of cancer from ear   SPLENECTOMY  1986    Home Medications:  Allergies as of 01/01/2022       Reactions   Sulfamethoxazole-trimethoprim Hives   Septra [bactrim]         Medication List        Accurate as of January 01, 2022  1:46 PM. If you have any questions, ask your nurse or doctor.          amLODipine 10 MG tablet Commonly known as: NORVASC TAKE 1 TABLET BY MOUTH DAILY FOR BLOOD PRESSURE   aspirin 81 MG tablet Take 81 mg by mouth daily.   CRANBERRY PO Take 3 tablets by mouth daily.   RA Cranberry 500 MG Caps Generic drug: Cranberry SMARTSIG:1 By Mouth   D-MANNOSE PO Take by mouth.   hydrochlorothiazide 25 MG tablet Commonly known as: HYDRODIURIL Take by mouth.   ketorolac 0.5 % ophthalmic solution Commonly known as: ACULAR   levothyroxine 100 MCG tablet Commonly known as: SYNTHROID TAKE 1 TAB BY MOUTH ONCE DAILY. TAKE ON AN EMPTY STOMACH WITH A GLASS OF WATER ATLEAST 30-60 MINUTES BEFORE BREAKFAST. Office visit required for further refills.   losartan 100 MG tablet Commonly known as: COZAAR TAKE 1 TABLET BY MOUTH ONCE A DAY FOR HIGH BLOOD PRESSURE.   metoprolol  tartrate 50 MG tablet Commonly known as: LOPRESSOR TAKE 1 TABLET BY MOUTH TWICE A DAY for blood pressure.   nitrofurantoin (macrocrystal-monohydrate) 100 MG capsule Commonly known as: MACROBID Take 1 capsule (100 mg total) by mouth daily.   omeprazole 20 MG capsule Commonly known as: PRILOSEC TAKE 1 CAPSULE BY MOUTH ONCE DAILY FOR HEARTBURN   Premarin vaginal cream Generic drug: conjugated estrogens Apply 0.'5mg'$  (pea-sized amount)  just inside the vaginal introitus with a finger-tip on  Monday, Wednesday and Friday nights.   rosuvastatin 5 MG tablet Commonly known as: CRESTOR Take 1 tablet (5 mg total) by mouth every other day. for cholesterol. Office visit required in October for further refills.   TYLENOL 500 MG tablet Generic  drug: acetaminophen SMARTSIG:1 By Mouth   TYLENOL PM EXTRA STRENGTH PO Take by mouth.   venlafaxine XR 75 MG 24 hr capsule Commonly known as: EFFEXOR-XR Take 1 capsule (75 mg total) by mouth daily with breakfast. For anxiety. Office visit required in October for further refills.        Allergies:  Allergies  Allergen Reactions   Sulfamethoxazole-Trimethoprim Hives   Septra [Bactrim]     Family History: Family History  Problem Relation Age of Onset   Breast cancer Mother        Age 62   Hypertension Father    Heart disease Father    Lung cancer Father    Breast cancer Sister        Age 81   Melanoma Sister        dx in her 64s   Dementia Sister    Heart disease Maternal Aunt    Prostate cancer Maternal Uncle    Colon cancer Maternal Grandfather    Colon cancer Maternal Uncle    Prostate cancer Maternal Uncle    Dementia Maternal Aunt    Breast cancer Cousin        maternal 2nd cousin   Breast cancer Cousin        maternal first cousin dx in her 38s   Melanoma Son        dx in his 19s    Social History:  reports that she has never smoked. She has never been exposed to tobacco smoke. She has never used smokeless tobacco. She reports that she does not drink alcohol and does not use drugs.  ROS:                                        Physical Exam: BP (!) 172/94   Pulse 84   Ht '5\' 4"'$  (1.626 m)   Wt 70.3 kg   BMI 26.61 kg/m   Constitutional:  Alert and oriented, No acute distress. HEENT: [place] AT, moist mucus membranes.  Trachea midline, no masses.   Laboratory Data: Lab Results  Component Value Date   WBC 6.8 12/21/2020   HGB 12.7 12/21/2020   HCT 36.7 12/21/2020   MCV 92 12/21/2020   PLT 265 12/21/2020    Lab Results  Component Value Date   CREATININE 0.64 12/21/2020    No results found for: "PSA"  No results found for: "TESTOSTERONE"  Lab Results  Component Value Date   HGBA1C 5.9 (H) 12/16/2019    Urinalysis     Component Value Date/Time   APPEARANCEUR Cloudy (A) 10/03/2021 1400   GLUCOSEU Negative 10/03/2021 1400   BILIRUBINUR Negative 10/03/2021 1400  PROTEINUR Negative 10/03/2021 1400   UROBILINOGEN 0.2 12/24/2017 0830   NITRITE Negative 10/03/2021 1400   LEUKOCYTESUR 3+ (A) 10/03/2021 1400    Pertinent Imaging:   Assessment & Plan: Patient has urge incontinence frequency and significant nighttime frequency.  She likely has a nocturnal diuresis with ankle edema.  I think we need to have reasonable treatment goals.  It was reasonable to try oxybutynin ER 10 mg 3x11.  Because of nighttime frequency discussed.  I actually discussed Botox with full template recognizing it may not be ideal for her.  I do not think InterStim is ideal.  I gave her a handout and I will think about it.  Reassess in 6 weeks on oxybutynin.  There are no diagnoses linked to this encounter.  No follow-ups on file.  Reece Packer, MD  [facility] [address] [place], [place], [place] [address] [phone]

## 2022-01-02 ENCOUNTER — Encounter: Payer: Self-pay | Admitting: Primary Care

## 2022-01-02 ENCOUNTER — Ambulatory Visit (INDEPENDENT_AMBULATORY_CARE_PROVIDER_SITE_OTHER): Payer: Medicare Other | Admitting: Primary Care

## 2022-01-02 VITALS — BP 142/74 | HR 92 | Temp 98.1°F | Ht 64.0 in | Wt 151.0 lb

## 2022-01-02 DIAGNOSIS — I10 Essential (primary) hypertension: Secondary | ICD-10-CM | POA: Diagnosis not present

## 2022-01-02 DIAGNOSIS — K589 Irritable bowel syndrome without diarrhea: Secondary | ICD-10-CM | POA: Diagnosis not present

## 2022-01-02 DIAGNOSIS — Z853 Personal history of malignant neoplasm of breast: Secondary | ICD-10-CM | POA: Diagnosis not present

## 2022-01-02 DIAGNOSIS — N390 Urinary tract infection, site not specified: Secondary | ICD-10-CM

## 2022-01-02 DIAGNOSIS — R21 Rash and other nonspecific skin eruption: Secondary | ICD-10-CM | POA: Insufficient documentation

## 2022-01-02 DIAGNOSIS — E785 Hyperlipidemia, unspecified: Secondary | ICD-10-CM | POA: Diagnosis not present

## 2022-01-02 DIAGNOSIS — F411 Generalized anxiety disorder: Secondary | ICD-10-CM | POA: Diagnosis not present

## 2022-01-02 DIAGNOSIS — Z23 Encounter for immunization: Secondary | ICD-10-CM

## 2022-01-02 DIAGNOSIS — E039 Hypothyroidism, unspecified: Secondary | ICD-10-CM | POA: Diagnosis not present

## 2022-01-02 NOTE — Addendum Note (Signed)
Addended by: Eddie North C on: 01/02/2022 11:46 AM   Modules accepted: Orders

## 2022-01-02 NOTE — Progress Notes (Signed)
Subjective:    Patient ID: Ashlee Lopez, female    DOB: Jul 16, 1933, 88 y.o.   MRN: [id number]  HPI  Ashlee Lopez is a very pleasant 88 y.o. female with a history of hypertension, IBS, hypothyroidism, uterine cancer, recurrent UTI, ITP, breast cancer, recurrent falls who presents today for follow-up of chronic conditions. Ashlee Lopez joins [place] today.  1) Essential Hypertension: Currently managed on amlodipine 10 mg daily, losartan 100 mg daily, metoprolol tartrate 50 mg twice daily.  She is not checking Ashlee BP at home. She denies headaches, dizziness. She's not taken HCTZ in about 1 year.   BP Readings from Last 3 Encounters:  01/02/22 (!) 142/74  01/01/22 (!) 172/94  10/03/21 (!) 161/81   2) Hypothyroidism: Currently managed on levothyroxine 100 mcg tablets.  She is taking Ashlee levothyroxine every morning on empty stomach with water only.  She avoids heartburn medication, iron pills, magnesium for 4 hours.  3) GAD: Currently managed on venlafaxine ER 75 mg daily. Feels well managed on this regimen, feels that anxiety has decreased.   4) Overactive Bladder/Recurrent UTI: Currently managed on oxybutynin XL 10 mg daily and Macrobid 100 mg daily for UTI prevention.  She denies recent UTI's. She continues to notice urinary urgency with incontinence during the day and evening, worse at night. Oxybutynin was added yesterday per Urology. There is a discussion of Botox injections, she will try oxybutynin first and update.   5) Chronic Back Pain: History of scoliosis and spinal stenosis. Following with neurosurgery, she will be receiving injections soon.   Review of Systems  Respiratory:  Negative for shortness of breath.   Cardiovascular:  Negative for chest pain.  Gastrointestinal:  Negative for constipation.  Genitourinary:        Urinary incontinence   Musculoskeletal:  Positive for back pain.  Neurological:  Negative for dizziness and headaches.  Psychiatric/Behavioral:  The patient is  not nervous/anxious.          Past Medical History:  Diagnosis Date   Benign essential HTN 03/21/2011   Breast cancer, stage 1 ([place]) 02/10/2003   Right tubular breast cancer   Cancer ([place])    Diverticulosis of colon 03/23/2011   Family history of breast cancer    Family history of colon cancer    Family history of melanoma    Family history of prostate cancer    Fibrocystic disease of breast 03/23/2011   Graves' disease with exophthalmos 03/23/2011   Hypertension    Hypothyroid 03/21/2011   IBS (irritable bowel syndrome) 03/23/2011   ITP (idiopathic thrombocytopenic purpura) 03/21/2011   Nasal fracture 01/2020   S/P splenectomy 03/21/2011   Uterus cancer ([place]) 03/23/1999   Well differentiated AdenoCA of endometrium-superficially confined   Varicose vein of leg 03/23/2011    Social History   Socioeconomic History   Marital status: Widowed    Spouse name: Not on file   Number of children: Not on file   Years of education: Not on file   Highest education level: Not on file  Occupational History   Not on file  Tobacco Use   Smoking status: Never    Passive exposure: Never   Smokeless tobacco: Never  Vaping Use   Vaping Use: Never used  Substance and Sexual Activity   Alcohol use: No   Drug use: No   Sexual activity: Never    Birth control/protection: Surgical  Other Topics Concern   Not on file  Social History Narrative   Widow. Lives  alone.    3 children, 5 grandchildren.   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   Enjoys reading, watching TV.   Social Determinants of Health   Financial Resource Strain: Low Risk  (07/25/2021)   Overall Financial Resource Strain (CARDIA)    Difficulty of Paying Living Expenses: Not hard at all  Food Insecurity: No Food Insecurity (07/25/2021)   Hunger Vital Sign    Worried About Running Out of Food in the Last Year: Never true    Ran Out of Food in the Last Year: Never true  Transportation Needs: No Transportation Needs (07/25/2021)   PRAPARE -  Hydrologist (Medical): No    Lack of Transportation (Non-Medical): No  Physical Activity: Inactive (12/16/2019)   Exercise Vital Sign    Days of Exercise per Week: 0 days    Minutes of Exercise per Session: 0 min  Stress: No Stress Concern Present (07/25/2021)   [place]    Feeling of Stress : Only a little  Social Connections: Moderately Isolated (07/25/2021)   Social Connection and Isolation Panel [NHANES]    Frequency of Communication with Friends and Family: More than three times a week    Frequency of Social Gatherings with Friends and Family: Once a week    Attends Religious Services: More than 4 times per year    Active Member of [employer] or Organizations: No    Attends Archivist Meetings: Never    Marital Status: Widowed  Intimate Partner Violence: Not At Risk (07/25/2021)   Humiliation, Afraid, Rape, and Kick questionnaire    Fear of Current or Ex-Partner: No    Emotionally Abused: No    Physically Abused: No    Sexually Abused: No    Past Surgical History:  Procedure Laterality Date   ABDOMINAL HYSTERECTOMY  2001   APPENDECTOMY  1941   BREAST SURGERY  02/17/2003   Mastectomy-Right   MASTECTOMY  2004   Dr Margot Chimes   OOPHORECTOMY     BSO   SKIN CANCER EXCISION  2019   removal of cancer from ear   SPLENECTOMY  1986    Family History  Problem Relation Age of Onset   Breast cancer Mother        Age 13   Hypertension Father    Heart disease Father    Lung cancer Father    Breast cancer Sister        Age 106   Melanoma Sister        dx in Ashlee 72s   Dementia Sister    Heart disease Maternal Aunt    Prostate cancer Maternal Uncle    Colon cancer Maternal Grandfather    Colon cancer Maternal Uncle    Prostate cancer Maternal Uncle    Dementia Maternal Aunt    Breast cancer Cousin        maternal 2nd cousin   Breast cancer Cousin        maternal first cousin dx  in Ashlee 54s   Melanoma Son        dx in his 3s    Allergies  Allergen Reactions   Sulfamethoxazole-Trimethoprim Hives   Septra [Bactrim]     Current Outpatient Medications on File Prior to Visit  Medication Sig Dispense Refill   amLODipine (NORVASC) 10 MG tablet TAKE 1 TABLET BY MOUTH DAILY FOR BLOOD PRESSURE 90 tablet 0   aspirin 81 MG tablet Take 81 mg by  mouth daily.     conjugated estrogens (PREMARIN) vaginal cream Apply 0.'5mg'$  (pea-sized amount)  just inside the vaginal introitus with a finger-tip on  Monday, Wednesday and Friday nights. 30 g 12   CRANBERRY PO Take 3 tablets by mouth daily.     D-MANNOSE PO Take by mouth.     Diphenhydramine-APAP, sleep, (TYLENOL PM EXTRA STRENGTH PO) Take by mouth.     levothyroxine (SYNTHROID) 100 MCG tablet TAKE 1 TAB BY MOUTH ONCE DAILY. TAKE ON AN EMPTY STOMACH WITH A GLASS OF WATER ATLEAST 30-60 MINUTES BEFORE BREAKFAST. Office visit required for further refills. 90 tablet 0   losartan (COZAAR) 100 MG tablet TAKE 1 TABLET BY MOUTH ONCE A DAY FOR HIGH BLOOD PRESSURE. 90 tablet 3   metoprolol tartrate (LOPRESSOR) 50 MG tablet TAKE 1 TABLET BY MOUTH TWICE A DAY for blood pressure. 180 tablet 0   nitrofurantoin, macrocrystal-monohydrate, (MACROBID) 100 MG capsule Take 1 capsule (100 mg total) by mouth daily. 30 capsule 2   omeprazole (PRILOSEC) 20 MG capsule TAKE 1 CAPSULE BY MOUTH ONCE DAILY FOR HEARTBURN 90 capsule 1   oxybutynin (DITROPAN-XL) 10 MG 24 hr tablet Take 1 tablet (10 mg total) by mouth at bedtime. 30 tablet 11   RA CRANBERRY 500 MG CAPS SMARTSIG:1 By Mouth     rosuvastatin (CRESTOR) 5 MG tablet Take 1 tablet (5 mg total) by mouth every other day. for cholesterol. Office visit required in October for further refills. 45 tablet 0   TYLENOL 500 MG tablet SMARTSIG:1 By Mouth     venlafaxine XR (EFFEXOR-XR) 75 MG 24 hr capsule Take 1 capsule (75 mg total) by mouth daily with breakfast. For anxiety. Office visit required in October for  further refills. 90 capsule 0   ketorolac (ACULAR) 0.5 % ophthalmic solution  (Patient not taking: Reported on 01/02/2022)     No current facility-administered medications on file prior to visit.    BP (!) 142/74   Pulse 92   Temp 98.1 F (36.7 C) (Temporal)   Ht '5\' 4"'$  (1.626 m)   Wt 151 lb (68.5 kg)   SpO2 98%   BMI 25.92 kg/m  Objective:   Physical Exam Cardiovascular:     Rate and Rhythm: Normal rate and regular rhythm.  Pulmonary:     Effort: Pulmonary effort is normal.     Breath sounds: Normal breath sounds.  Abdominal:     General: Bowel sounds are normal.     Palpations: Abdomen is soft.     Tenderness: There is no abdominal tenderness.  Musculoskeletal:     Cervical back: Neck supple.  Skin:    General: Skin is warm and dry.     Comments: 4 cm rounded, flat, mildly scaly, mild red spot to left medial ankle.   Neurological:     Mental Status: She is alert and oriented to person, place, and time.  Psychiatric:        Mood and Affect: Mood normal.           Assessment & Plan:   Problem List Items Addressed This Visit       Cardiovascular and Mediastinum   Benign essential HTN    Improved.  Continue metoprolol tartrate 50 mg BID, amlodipine 10 mg daily, losartan 100 mg daily. Remain off HCTZ 25 mg for now.  CMP pending.       Relevant Orders   CBC     Digestive   IBS (irritable bowel syndrome)    Controlled.  No concerns today. Continue to monitor.         Endocrine   Hypothyroid - Primary (Chronic)    She is taking levothyroxine correctly. Continue levothyroxine 100 mcg daily.  Repeat TSH pending.      Relevant Orders   TSH     Musculoskeletal and Integument   Rash and nonspecific skin eruption    Appears fungal. Start clotrimazole 1-2 times daily x 2 weeks. She will update if no improvement.         Genitourinary   Recurrent UTI    Following with Urology.  Continue Premain cream 0.5 mg three days weekly. Continue  Macrobid 100 mg daily.        Other   History of breast cancer, invasive mammary right     Mammogram UTD. Breast exam completed today.       Generalized anxiety disorder    Improved!  Continue venlafaxine ER 75 mg daily. Continue to monitor.       Hyperlipidemia    Continue rosuvastatin 5 mg daily. Repeat lipid panel pending.      Relevant Orders   Lipid panel   Comprehensive metabolic panel   Other Visit Diagnoses     Need for immunization against influenza       Relevant Orders   Flu Vaccine QUAD High Dose(Fluad) (Completed)          Pleas Koch, NP

## 2022-01-02 NOTE — Patient Instructions (Addendum)
Stop by the lab prior to leaving today. I will notify you of your results once received.   Try clotrimazole cream twice daily to the spot on your foot for 2 weeks. Let me know if no improvement.   It was a pleasure to see you today!

## 2022-01-02 NOTE — Assessment & Plan Note (Signed)
Following with Urology.  Continue Premain cream 0.5 mg three days weekly. Continue Macrobid 100 mg daily.

## 2022-01-02 NOTE — Assessment & Plan Note (Signed)
Mammogram UTD. Breast exam completed today.

## 2022-01-02 NOTE — Assessment & Plan Note (Signed)
Improved!  Continue venlafaxine ER 75 mg daily. Continue to monitor.

## 2022-01-02 NOTE — Assessment & Plan Note (Signed)
Continue rosuvastatin 5 mg daily. Repeat lipid panel pending 

## 2022-01-02 NOTE — Assessment & Plan Note (Signed)
She is taking levothyroxine correctly. Continue levothyroxine 100 mcg daily. Repeat TSH pending. 

## 2022-01-02 NOTE — Assessment & Plan Note (Signed)
Improved.  Continue metoprolol tartrate 50 mg BID, amlodipine 10 mg daily, losartan 100 mg daily. Remain off HCTZ 25 mg for now.  CMP pending.

## 2022-01-02 NOTE — Assessment & Plan Note (Signed)
Controlled.  No concerns today. Continue to monitor.  

## 2022-01-02 NOTE — Assessment & Plan Note (Signed)
Appears fungal. Start clotrimazole 1-2 times daily x 2 weeks. She will update if no improvement.

## 2022-01-03 LAB — COMPREHENSIVE METABOLIC PANEL
ALT: 15 IU/L (ref 0–32)
AST: 27 IU/L (ref 0–40)
Albumin/Globulin Ratio: 1.6 (ref 1.2–2.2)
Albumin: 4.6 g/dL (ref 3.7–4.7)
Alkaline Phosphatase: 85 IU/L (ref 44–121)
BUN/Creatinine Ratio: 24 (ref 12–28)
BUN: 19 mg/dL (ref 8–27)
Bilirubin Total: 0.5 mg/dL (ref 0.0–1.2)
CO2: 20 mmol/L (ref 20–29)
Calcium: 10.1 mg/dL (ref 8.7–10.3)
Chloride: 98 mmol/L (ref 96–106)
Creatinine, Ser: 0.78 mg/dL (ref 0.57–1.00)
Globulin, Total: 2.8 g/dL (ref 1.5–4.5)
Glucose: 91 mg/dL (ref 70–99)
Potassium: 4 mmol/L (ref 3.5–5.2)
Sodium: 139 mmol/L (ref 134–144)
Total Protein: 7.4 g/dL (ref 6.0–8.5)
eGFR: 74 mL/min/{1.73_m2} (ref >59)

## 2022-01-03 LAB — CBC
Hematocrit: 37.6 % (ref 34.0–46.6)
Hemoglobin: 12.7 g/dL (ref 11.1–15.9)
MCH: 31.2 pg (ref 26.6–33.0)
MCHC: 33.8 g/dL (ref 31.5–35.7)
MCV: 92 fL (ref 79–97)
Platelets: 300 10*3/uL (ref 150–450)
RBC: 4.07 x10E6/uL (ref 3.77–5.28)
RDW: 12.8 % (ref 11.7–15.4)
WBC: 6.8 10*3/uL (ref 3.4–10.8)

## 2022-01-03 LAB — LIPID PANEL
Chol/HDL Ratio: 2.6 ratio (ref 0.0–4.4)
Cholesterol, Total: 161 mg/dL (ref 100–199)
HDL: 63 mg/dL (ref >39)
LDL Chol Calc (NIH): 81 mg/dL (ref 0–99)
Triglycerides: 94 mg/dL (ref 0–149)
VLDL Cholesterol Cal: 17 mg/dL (ref 5–40)

## 2022-01-03 LAB — TSH: TSH: 1.22 u[IU]/mL (ref 0.450–4.500)

## 2022-01-05 ENCOUNTER — Telehealth: Payer: Self-pay | Admitting: Primary Care

## 2022-01-05 NOTE — Telephone Encounter (Signed)
Spoke with patient, lab results placed in the mail 01/05/22.

## 2022-01-05 NOTE — Telephone Encounter (Signed)
Pt called in requesting a copy of there last lab results me mail to her home . # [phone]

## 2022-01-08 IMAGING — CT CT MAXILLOFACIAL W/O CM
3 series · 14 of 47 positions shown, 16 images · non-contrast
Comparison: None.

CLINICAL DATA: Fall

EXAM:
CT HEAD WITHOUT CONTRAST
TECHNIQUE: Contiguous axial images were obtained from the base of the skull
through the vertex without intravenous contrast.

[Series 3: max soft · axial · 0.30mm/px · z∈[-218,-68]mm · 8 of 88 slices shown, 10 images]
[im 7/88  brain]
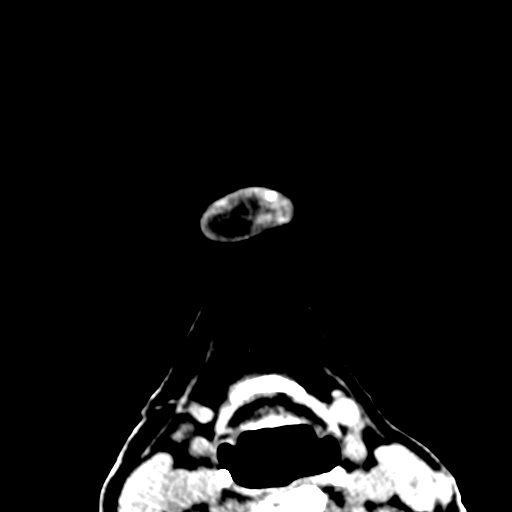
[im 7/88  bone]
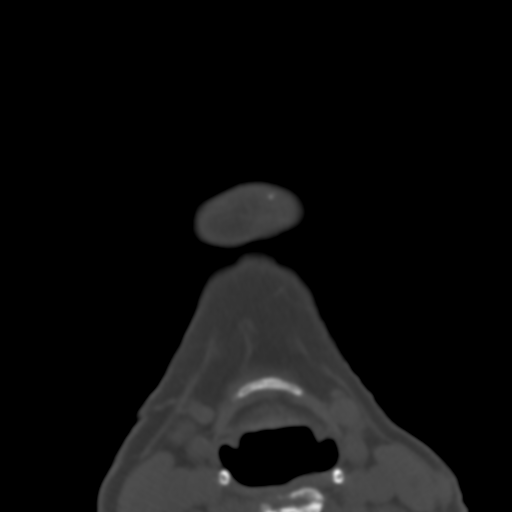
[im 19/88  bone]
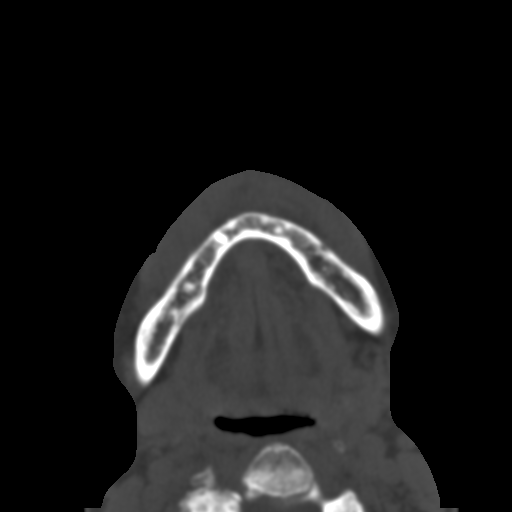
[im 28/88  bone]
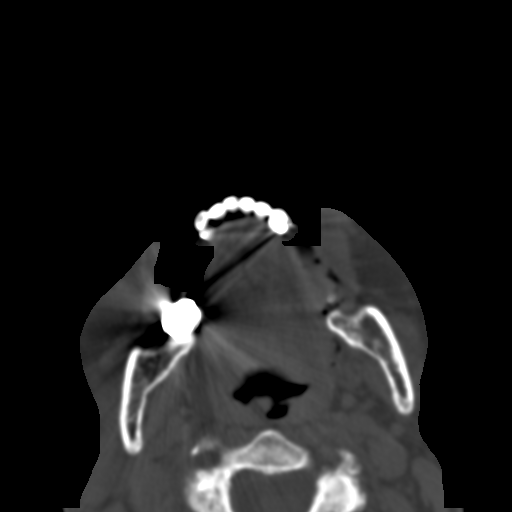
[im 40/88  bone]
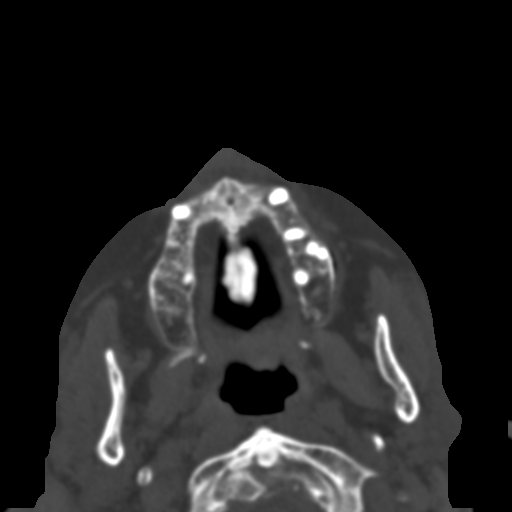
[im 49/88  brain]
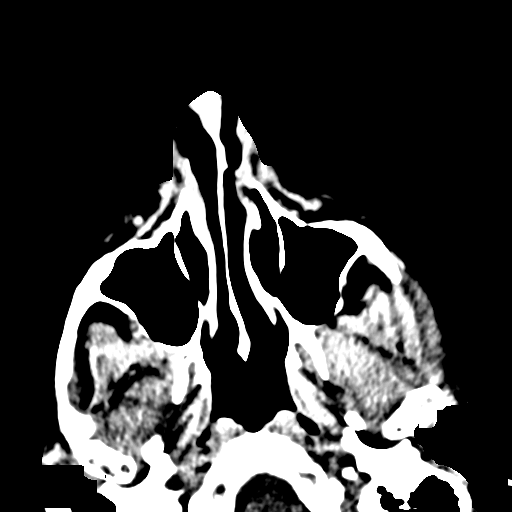
[im 49/88  bone]
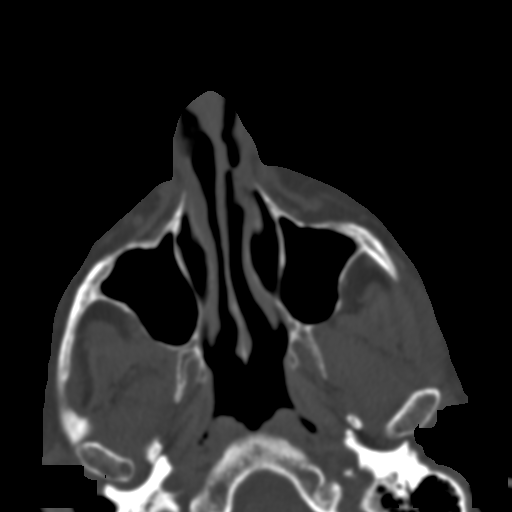
[im 61/88  bone]
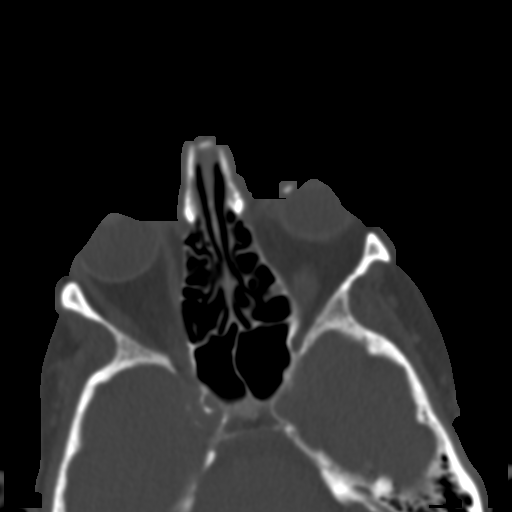
[im 70/88  bone]
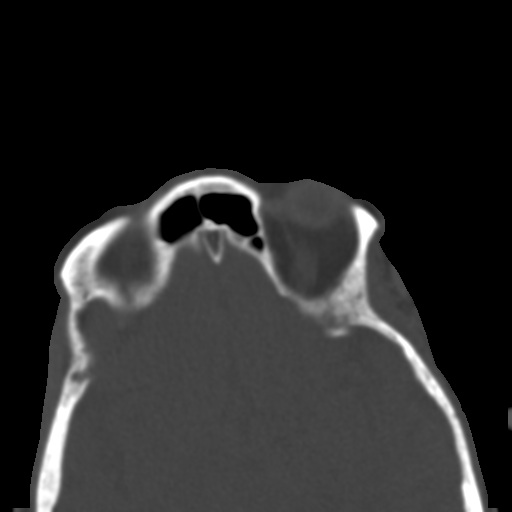
[im 82/88  bone]
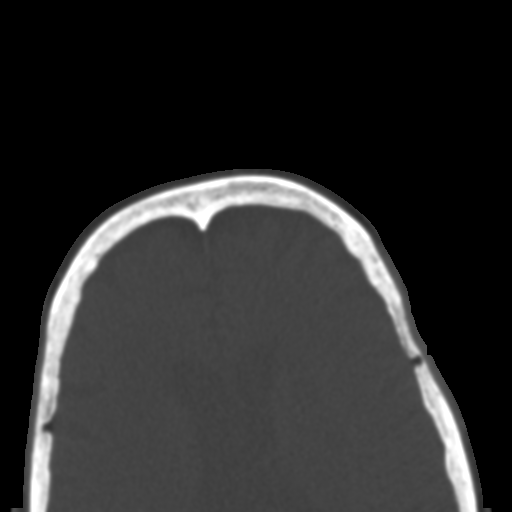

[Series 7: coronal soft · coronal · 0.30mm/px · 3 of 73 slices shown]
[im 25/73  bone]
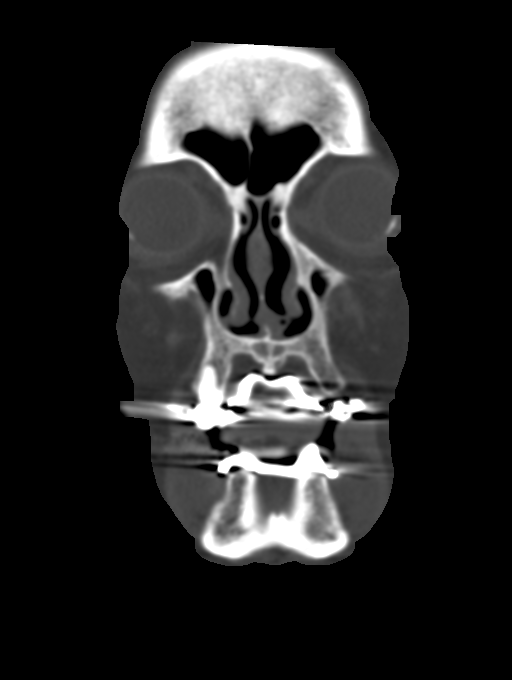
[im 33/73  bone]
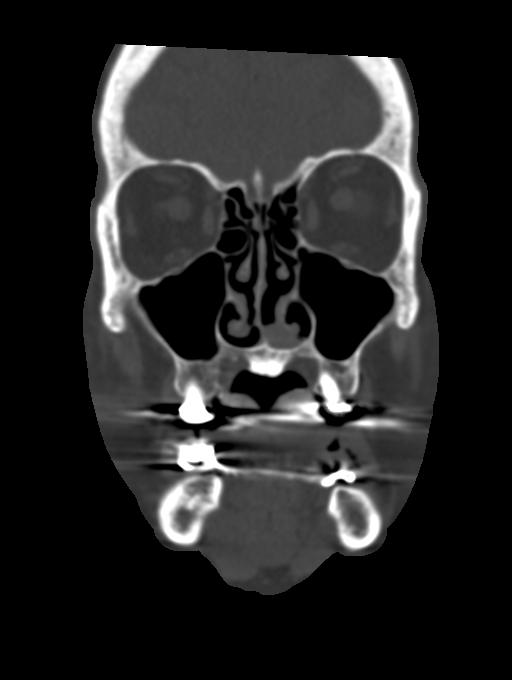
[im 41/73  bone]
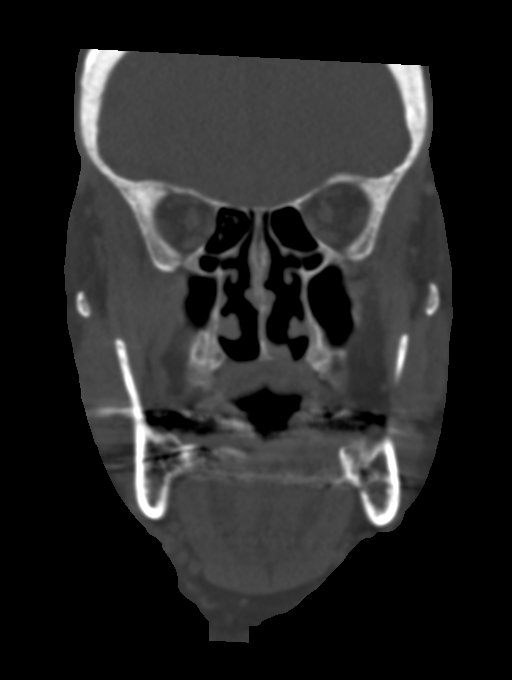

[Series 8: sagittal soft · sagittal · 0.28mm/px · 3 of 74 slices shown]
[im 25/74  bone]
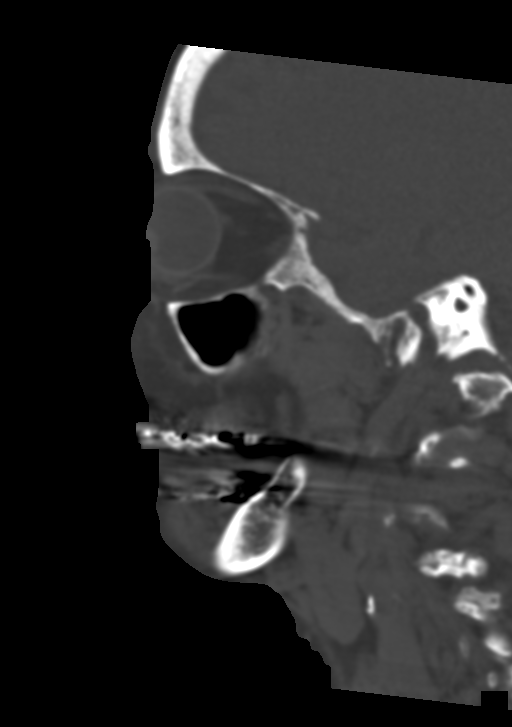
[im 37/74  bone]
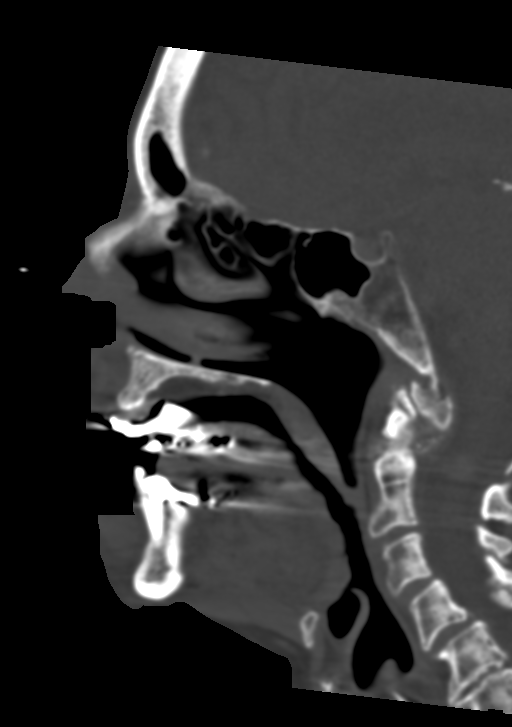
[im 49/74  bone]
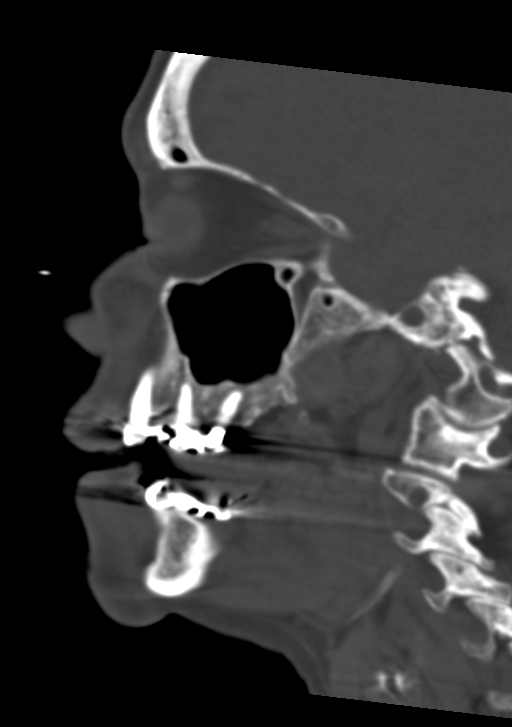

[14 of 47 positions shown; findings below may reference images not displayed]

FINDINGS: Brain: No evidence of acute territorial infarction, hemorrhage,
hydrocephalus,extra-axial collection or mass lesion/mass effect.
There is dilatation the ventricles and sulci consistent with
age-related atrophy. Low-attenuation changes in the deep white
matter consistent with small vessel ischemia.

Vascular: No hyperdense vessel or unexpected calcification.

Skull: The skull is intact. No fracture or focal lesion identified.

Sinuses/Orbits: The visualized paranasal sinuses and mastoid air
cells are clear. The orbits and globes intact.

Other: None

Face:

Osseous: There is a nondisplaced inferior left nasal bone fracture.
Overlying nasal bridge soft tissue swelling is seen. There is a
small probable anterior septal hematoma present. No other osseous
fracture is identified.

Orbits: No fracture identified. Unremarkable appearance of globes
and orbits.

Sinuses: The visualized paranasal sinuses and mastoid air cells are
unremarkable.

Soft tissues:  No acute findings.

Limited intracranial: No acute findings.
IMPRESSION: No acute intracranial abnormality.

Findings consistent with age related atrophy and chronic small
vessel ischemia

Nondisplaced inferior left nasal bone fracture with overlying nasal
bridge soft tissue swelling and probable small septal hematoma.

## 2022-02-06 ENCOUNTER — Other Ambulatory Visit: Payer: Self-pay | Admitting: Primary Care

## 2022-02-06 DIAGNOSIS — F411 Generalized anxiety disorder: Secondary | ICD-10-CM

## 2022-02-06 DIAGNOSIS — E039 Hypothyroidism, unspecified: Secondary | ICD-10-CM

## 2022-02-06 DIAGNOSIS — E785 Hyperlipidemia, unspecified: Secondary | ICD-10-CM

## 2022-02-14 ENCOUNTER — Other Ambulatory Visit: Payer: Self-pay | Admitting: Primary Care

## 2022-02-14 DIAGNOSIS — I10 Essential (primary) hypertension: Secondary | ICD-10-CM

## 2022-02-19 ENCOUNTER — Ambulatory Visit: Payer: Medicare Other | Admitting: Urology

## 2022-02-26 ENCOUNTER — Other Ambulatory Visit: Payer: Self-pay | Admitting: Primary Care

## 2022-02-26 DIAGNOSIS — I10 Essential (primary) hypertension: Secondary | ICD-10-CM

## 2022-03-14 ENCOUNTER — Ambulatory Visit: Payer: Medicare Other | Admitting: Podiatry

## 2022-03-26 ENCOUNTER — Encounter: Payer: Self-pay | Admitting: Urology

## 2022-03-26 ENCOUNTER — Ambulatory Visit: Payer: Medicare Other | Admitting: Urology

## 2022-04-10 ENCOUNTER — Telehealth: Payer: Self-pay | Admitting: *Deleted

## 2022-04-10 DIAGNOSIS — N3090 Cystitis, unspecified without hematuria: Secondary | ICD-10-CM

## 2022-04-10 NOTE — Telephone Encounter (Signed)
Faxed request asking for refill for Macrobid, pt was supposed to follow-up and canceled and appt. This medication is no longer on her active med list, it was filled in 12/2021 with only 2 refills. Oxybutynin was filled for year. Rescheduled appt is February. Ok to fill???

## 2022-04-16 MED ORDER — NITROFURANTOIN MONOHYD MACRO 100 MG PO CAPS: 100.0000 mg | ORAL_CAPSULE | Freq: Every day | ORAL | 0 refills | Status: DC

## 2022-04-16 NOTE — Telephone Encounter (Signed)
Rx sent to pharmacy rx sent to pharmacy by e-script

## 2022-04-18 ENCOUNTER — Encounter: Payer: Self-pay | Admitting: Podiatry

## 2022-04-18 ENCOUNTER — Ambulatory Visit (INDEPENDENT_AMBULATORY_CARE_PROVIDER_SITE_OTHER): Payer: Medicare Other | Admitting: Podiatry

## 2022-04-18 DIAGNOSIS — B351 Tinea unguium: Secondary | ICD-10-CM | POA: Diagnosis not present

## 2022-04-18 DIAGNOSIS — M79676 Pain in unspecified toe(s): Secondary | ICD-10-CM | POA: Diagnosis not present

## 2022-04-18 DIAGNOSIS — M79672 Pain in left foot: Secondary | ICD-10-CM | POA: Diagnosis not present

## 2022-04-18 NOTE — Progress Notes (Signed)
Kalyse presents today with chief complaint of painful elongated toenails.  States that this been quite a while since she had them trimmed.  Objective: Vital signs stable alert oriented x 3 pulses are +1/4 DP and PT bilateral.  Capillary fill time is immediate no open lesions or wounds are noted.  Toenails are long thick yellow dystrophic clinically mycotic.  Assessment: Pain in limb secondary to onychomycosis.  Plan: Debridement of toenails 1 through 5 bilateral covered service secondary to pain.

## 2022-04-30 ENCOUNTER — Ambulatory Visit (INDEPENDENT_AMBULATORY_CARE_PROVIDER_SITE_OTHER): Payer: Medicare Other | Admitting: Urology

## 2022-04-30 VITALS — BP 154/82 | HR 91 | Ht 64.0 in | Wt 150.0 lb

## 2022-04-30 DIAGNOSIS — N3281 Overactive bladder: Secondary | ICD-10-CM | POA: Diagnosis not present

## 2022-04-30 DIAGNOSIS — R351 Nocturia: Secondary | ICD-10-CM

## 2022-04-30 MED ORDER — OXYBUTYNIN CHLORIDE ER 10 MG PO TB24: 10.0000 mg | ORAL_TABLET | Freq: Every day | ORAL | 3 refills | Status: DC

## 2022-04-30 NOTE — Addendum Note (Signed)
Addended by: Kris Mouton on: 04/30/2022 10:52 AM   Modules accepted: Orders

## 2022-04-30 NOTE — Progress Notes (Signed)
04/30/2022 10:44 AM   Ashlee Lopez [birth year] [id number]  Referring provider: Pleas Koch, NP [address] [place],  [place] [address]  Chief Complaint  Patient presents with   Over Active Bladder    HPI: Other providers: Patient has failed Vesicare Gemtesa and percutaneous tibial nerve stimulation.  On suppressive antibiotics and Myrbetriq and sleep study for nocturia.  She believes the daily Macrobid is helped her in the past.  Negative cystoscopy May 2022   Patient voids 5-6 times a night.  She does have ankle edema.  She does not take a diuretic.  She voids every 1-2 hours during the day.  She wears 4 pads a day and uses tissue as a double padding system.  Sometimes damp sometimes soaked   She is not a strong historian by do not think she is getting a lot of bladder infections recently.  She has a history of spinal stenosis.   Currently on Macrobid once a day.  She could not leave a specimen   No previous bladder surgery.    Patient has urge incontinence frequency and significant nighttime frequency. She likely has a nocturnal diuresis with ankle edema. I think we need to have reasonable treatment goals. It was reasonable to try oxybutynin ER 10 mg 3x11. Causes of nighttime frequency discussed. I actually discussed Botox with full template recognizing it may not be ideal for her. I do not think InterStim is ideal. I gave her a handout and I will think about it. Reassess in 6 weeks on oxybutynin.   Today Frequency stable.  Gets up 3-4 times a night and a bad night 6 times.  He thinks it helps some.  She does not want Botox and I can preshaped that.    PMH: Past Medical History:  Diagnosis Date   Benign essential HTN 03/21/2011   Breast cancer, stage 1 ([place]) 02/10/2003   Right tubular breast cancer   Cancer ([place])    Diverticulosis of colon 03/23/2011   Family history of breast cancer    Family history of colon cancer    Family history of melanoma    Family history  of prostate cancer    Fibrocystic disease of breast 03/23/2011   Graves' disease with exophthalmos 03/23/2011   Hypertension    Hypothyroid 03/21/2011   IBS (irritable bowel syndrome) 03/23/2011   ITP (idiopathic thrombocytopenic purpura) 03/21/2011   Nasal fracture 01/2020   S/P splenectomy 03/21/2011   Uterus cancer ([place]) 03/23/1999   Well differentiated AdenoCA of endometrium-superficially confined   Varicose vein of leg 03/23/2011    Surgical History: Past Surgical History:  Procedure Laterality Date   ABDOMINAL HYSTERECTOMY  2001   APPENDECTOMY  1941   BREAST SURGERY  02/17/2003   Mastectomy-Right   MASTECTOMY  2004   Dr Margot Chimes   OOPHORECTOMY     BSO   SKIN CANCER EXCISION  2019   removal of cancer from ear   SPLENECTOMY  1986    Home Medications:  Allergies as of 04/30/2022       Reactions   Sulfamethoxazole-trimethoprim Hives   Septra [bactrim]         Medication List        Accurate as of April 30, 2022 10:44 AM. If you have any questions, ask your nurse or doctor.          amLODipine 10 MG tablet Commonly known as: NORVASC TAKE 1 TABLET BY MOUTH DAILY FOR BLOOD PRESSURE   aspirin 81  MG tablet Take 81 mg by mouth daily.   CRANBERRY PO Take 3 tablets by mouth daily.   RA Cranberry 500 MG Caps Generic drug: Cranberry SMARTSIG:1 By Mouth   D-MANNOSE PO Take by mouth.   ketorolac 0.5 % ophthalmic solution Commonly known as: ACULAR   levothyroxine 100 MCG tablet Commonly known as: SYNTHROID TAKE 1 TAB BY MOUTH ONCE DAILY. TAKE ON AN EMPTY STOMACH WITH A GLASS OF WATER ATLEAST 30-60 MINUTES BEFORE BREAKFAST   losartan 100 MG tablet Commonly known as: COZAAR TAKE 1 TABLET BY MOUTH ONCE A DAY FOR HIGH BLOOD PRESSURE.   metoprolol tartrate 50 MG tablet Commonly known as: LOPRESSOR TAKE 1 TABLET BY MOUTH TWICE A DAY FOR BLOOD PRESSURE.   nitrofurantoin (macrocrystal-monohydrate) 100 MG capsule Commonly known as: MACROBID Take 1 capsule (100 mg  total) by mouth daily.   omeprazole 20 MG capsule Commonly known as: PRILOSEC TAKE 1 CAPSULE BY MOUTH ONCE DAILY FOR HEARTBURN   oxybutynin 10 MG 24 hr tablet Commonly known as: DITROPAN-XL Take 1 tablet (10 mg total) by mouth at bedtime.   Premarin vaginal cream Generic drug: conjugated estrogens Apply 0.24m (pea-sized amount)  just inside the vaginal introitus with a finger-tip on  Monday, Wednesday and Friday nights.   rosuvastatin 5 MG tablet Commonly known as: CRESTOR Take 1 tablet (5 mg total) by mouth every other day. for cholesterol.   TYLENOL 500 MG tablet Generic drug: acetaminophen SMARTSIG:1 By Mouth   TYLENOL PM EXTRA STRENGTH PO Take by mouth.   venlafaxine XR 75 MG 24 hr capsule Commonly known as: EFFEXOR-XR Take 1 capsule (75 mg total) by mouth daily with breakfast. For anxiety        Allergies:  Allergies  Allergen Reactions   Sulfamethoxazole-Trimethoprim Hives   Septra [Bactrim]     Family History: Family History  Problem Relation Age of Onset   Breast cancer Mother        Age 87  Hypertension Father    Heart disease Father    Lung cancer Father    Breast cancer Sister        Age 87  Melanoma Sister        dx in her 638s  Dementia Sister    Heart disease Maternal Aunt    Prostate cancer Maternal Uncle    Colon cancer Maternal Grandfather    Colon cancer Maternal Uncle    Prostate cancer Maternal Uncle    Dementia Maternal Aunt    Breast cancer Cousin        maternal 2nd cousin   Breast cancer Cousin        maternal first cousin dx in her 515s  Melanoma Son        dx in his 423s   Social History:  reports that she has never smoked. She has never been exposed to tobacco smoke. She has never used smokeless tobacco. She reports that she does not drink alcohol and does not use drugs.  ROS:                                        Physical Exam: BP (!) 154/82   Pulse 91   Ht 5' 4"$  (1.626 m)   Wt 68 kg    BMI 25.75 kg/m   Constitutional:  Alert and oriented, No acute distress. HEENT: [place] AT, moist mucus membranes.  Trachea midline, no masses.   Laboratory Data: Lab Results  Component Value Date   WBC 6.8 01/02/2022   HGB 12.7 01/02/2022   HCT 37.6 01/02/2022   MCV 92 01/02/2022   PLT 300 01/02/2022    Lab Results  Component Value Date   CREATININE 0.78 01/02/2022    No results found for: "PSA"  No results found for: "TESTOSTERONE"  Lab Results  Component Value Date   HGBA1C 5.9 (H) 12/16/2019    Urinalysis    Component Value Date/Time   APPEARANCEUR Cloudy (A) 10/03/2021 1400   GLUCOSEU Negative 10/03/2021 1400   BILIRUBINUR Negative 10/03/2021 1400   PROTEINUR Negative 10/03/2021 1400   UROBILINOGEN 0.2 12/24/2017 0830   NITRITE Negative 10/03/2021 1400   LEUKOCYTESUR 3+ (A) 10/03/2021 1400    Pertinent Imaging:   Assessment & Plan: Oxybutynin 10 mg daily 90 x 3 sent to pharmacy and I will see her in 1 year  1. Overactive bladder  - Urinalysis, Complete   No follow-ups on file.  Reece Packer, MD  [facility] [address] [place], [place] [address] [phone]

## 2022-07-05 ENCOUNTER — Telehealth: Payer: Self-pay | Admitting: Primary Care

## 2022-07-05 NOTE — Telephone Encounter (Signed)
Contacted Ashlee Lopez to schedule their annual wellness visit. Appointment made for 08/09/2022.  [facility] Care Guide [facility] AWV TEAM Direct Dial: [phone]

## 2022-07-18 ENCOUNTER — Ambulatory Visit: Payer: Medicare Other | Admitting: Podiatry

## 2022-07-24 ENCOUNTER — Other Ambulatory Visit: Payer: Self-pay | Admitting: Urology

## 2022-07-24 ENCOUNTER — Telehealth: Payer: Self-pay | Admitting: Family Medicine

## 2022-07-24 ENCOUNTER — Other Ambulatory Visit: Payer: Self-pay | Admitting: Family Medicine

## 2022-07-24 DIAGNOSIS — N952 Postmenopausal atrophic vaginitis: Secondary | ICD-10-CM

## 2022-07-24 MED ORDER — ESTRADIOL 0.1 MG/GM VA CREA: TOPICAL_CREAM | VAGINAL | 12 refills | Status: DC

## 2022-07-24 NOTE — Telephone Encounter (Signed)
Patient left message on triage line stating she has run out of her Premarin. She states she is supposed to get a different medication sent in that will be cheaper.

## 2022-07-25 ENCOUNTER — Ambulatory Visit (INDEPENDENT_AMBULATORY_CARE_PROVIDER_SITE_OTHER): Payer: Medicare Other | Admitting: Podiatry

## 2022-07-25 DIAGNOSIS — B351 Tinea unguium: Secondary | ICD-10-CM

## 2022-07-25 DIAGNOSIS — M79676 Pain in unspecified toe(s): Secondary | ICD-10-CM

## 2022-07-25 NOTE — Progress Notes (Signed)
She presents today chief complaint of painful elongated toenails.  Objective: Vital signs stable alert oriented x 3.  Toenails are long thick yellow dystrophic onychomycotic pulses are palpable no open lesions or wounds are noted.  Assessment: Pain in limb secondary onychomycosis.  Plan: Debridement of toenails 1 through 5 bilateral.

## 2022-07-25 NOTE — Telephone Encounter (Signed)
Patient notified

## 2022-08-09 ENCOUNTER — Ambulatory Visit (INDEPENDENT_AMBULATORY_CARE_PROVIDER_SITE_OTHER): Payer: Medicare Other

## 2022-08-09 VITALS — Ht 64.0 in | Wt 152.0 lb

## 2022-08-09 DIAGNOSIS — Z780 Asymptomatic menopausal state: Secondary | ICD-10-CM

## 2022-08-09 DIAGNOSIS — Z0000 Encounter for general adult medical examination without abnormal findings: Secondary | ICD-10-CM

## 2022-08-09 DIAGNOSIS — L821 Other seborrheic keratosis: Secondary | ICD-10-CM | POA: Diagnosis not present

## 2022-08-09 DIAGNOSIS — Z85828 Personal history of other malignant neoplasm of skin: Secondary | ICD-10-CM | POA: Diagnosis not present

## 2022-08-09 DIAGNOSIS — L918 Other hypertrophic disorders of the skin: Secondary | ICD-10-CM | POA: Diagnosis not present

## 2022-08-09 DIAGNOSIS — D225 Melanocytic nevi of trunk: Secondary | ICD-10-CM | POA: Diagnosis not present

## 2022-08-09 DIAGNOSIS — L814 Other melanin hyperpigmentation: Secondary | ICD-10-CM | POA: Diagnosis not present

## 2022-08-09 DIAGNOSIS — Z419 Encounter for procedure for purposes other than remedying health state, unspecified: Secondary | ICD-10-CM | POA: Diagnosis not present

## 2022-08-09 NOTE — Progress Notes (Signed)
I connected with  [name] on 08/09/22 by a audio enabled telemedicine application and verified that I am speaking with the correct person using two identifiers.  Patient Location: Home  Provider Location: Home Office  I discussed the limitations of evaluation and management by telemedicine. The patient expressed understanding and agreed to proceed.  Subjective:   Ashlee Lopez is a 89 y.o. female who presents for Medicare Annual (Subsequent) preventive examination.  Review of Systems      Cardiac Risk Factors include: advanced age (>66men, >45 women);hypertension;sedentary lifestyle     Objective:    Today's Vitals   08/09/22 1157  Weight: 152 lb (68.9 kg)  Height: 5\' 4"  (1.626 m)   Body mass index is 26.09 kg/m.     08/09/2022   12:14 PM 07/25/2021   11:03 AM 02/02/2020    9:19 PM 12/16/2019    2:08 PM 12/04/2017    3:49 PM 11/21/2016    8:40 AM 09/28/2015   10:16 AM  Advanced Directives  Does Patient Have a Medical Advance Directive? Yes No No Yes Yes Yes Yes  Type of Estate agent of [place];Living will   Healthcare Power of [place];Living will Healthcare Power of [place];Living will Healthcare Power of [place];Living will Healthcare Power of [place];Living will  Does patient want to make changes to medical advance directive?     No - Patient declined  No - Patient declined  Copy of Healthcare Power of Attorney in Chart? No - copy requested   No - copy requested No - copy requested No - copy requested No - copy requested  Would patient like information on creating a medical advance directive?  No - Patient declined No - Patient declined        Current Medications (verified) Outpatient Encounter Medications as of 08/09/2022  Medication Sig   amLODipine (NORVASC) 10 MG tablet TAKE 1 TABLET BY MOUTH DAILY FOR BLOOD PRESSURE   aspirin 81 MG tablet Take 81 mg by mouth daily.   Cholecalciferol (VITAMIN D-3) 25 MCG (1000 UT) CAPS Take by mouth.    CRANBERRY PO Take 3 tablets by mouth daily.   D-MANNOSE PO Take by mouth.   Diphenhydramine-APAP, sleep, (TYLENOL PM EXTRA STRENGTH PO) Take by mouth.   estradiol (ESTRACE VAGINAL) 0.1 MG/GM vaginal cream Apply 0.5mg  (pea-sized amount)  just inside the vaginal introitus with a finger-tip on Monday, Wednesday and Friday nights.   levothyroxine (SYNTHROID) 100 MCG tablet TAKE 1 TAB BY MOUTH ONCE DAILY. TAKE ON AN EMPTY STOMACH WITH A GLASS OF WATER ATLEAST 30-60 MINUTES BEFORE BREAKFAST   losartan (COZAAR) 100 MG tablet TAKE 1 TABLET BY MOUTH ONCE A DAY FOR HIGH BLOOD PRESSURE.   metoprolol tartrate (LOPRESSOR) 50 MG tablet TAKE 1 TABLET BY MOUTH TWICE A DAY FOR BLOOD PRESSURE.   omeprazole (PRILOSEC) 20 MG capsule TAKE 1 CAPSULE BY MOUTH ONCE DAILY FOR HEARTBURN   oxybutynin (DITROPAN-XL) 10 MG 24 hr tablet Take 1 tablet (10 mg total) by mouth at bedtime.   RA CRANBERRY 500 MG CAPS SMARTSIG:1 By Mouth   rosuvastatin (CRESTOR) 5 MG tablet Take 1 tablet (5 mg total) by mouth every other day. for cholesterol.   TYLENOL 500 MG tablet SMARTSIG:1 By Mouth   venlafaxine XR (EFFEXOR-XR) 75 MG 24 hr capsule Take 1 capsule (75 mg total) by mouth daily with breakfast. For anxiety   Zinc 50 MG TABS Take by mouth.   ketorolac (ACULAR) 0.5 % ophthalmic solution  (Patient not taking:  Reported on 08/09/2022)   nitrofurantoin, macrocrystal-monohydrate, (MACROBID) 100 MG capsule Take 1 capsule (100 mg total) by mouth daily. (Patient not taking: Reported on 08/09/2022)   No facility-administered encounter medications on file as of 08/09/2022.    Allergies (verified) Sulfamethoxazole-trimethoprim and Septra [bactrim]   History: Past Medical History:  Diagnosis Date   Benign essential HTN 03/21/2011   Breast cancer, stage 1 (HCC) 02/10/2003   Right tubular breast cancer   Cancer (HCC)    Diverticulosis of colon 03/23/2011   Family history of breast cancer    Family history of colon cancer    Family history of  melanoma    Family history of prostate cancer    Fibrocystic disease of breast 03/23/2011   Graves' disease with exophthalmos 03/23/2011   Hypertension    Hypothyroid 03/21/2011   IBS (irritable bowel syndrome) 03/23/2011   ITP (idiopathic thrombocytopenic purpura) 03/21/2011   Nasal fracture 01/2020   S/P splenectomy 03/21/2011   Uterus cancer (HCC) 03/23/1999   Well differentiated AdenoCA of endometrium-superficially confined   Varicose vein of leg 03/23/2011   Past Surgical History:  Procedure Laterality Date   ABDOMINAL HYSTERECTOMY  2001   APPENDECTOMY  1941   BREAST SURGERY  02/17/2003   Mastectomy-Right   MASTECTOMY  2004   Dr Jamey Ripa   OOPHORECTOMY     BSO   SKIN CANCER EXCISION  2019   removal of cancer from ear   SPLENECTOMY  1986   Family History  Problem Relation Age of Onset   Breast cancer Mother        Age 69   Hypertension Father    Heart disease Father    Lung cancer Father    Breast cancer Sister        Age 38   Melanoma Sister        dx in her 46s   Dementia Sister    Heart disease Maternal Aunt    Prostate cancer Maternal Uncle    Colon cancer Maternal Grandfather    Colon cancer Maternal Uncle    Prostate cancer Maternal Uncle    Dementia Maternal Aunt    Breast cancer Cousin        maternal 2nd cousin   Breast cancer Cousin        maternal first cousin dx in her 59s   Melanoma Son        dx in his 86s   Social History   Socioeconomic History   Marital status: Widowed    Spouse name: Not on file   Number of children: Not on file   Years of education: Not on file   Highest education level: Not on file  Occupational History   Not on file  Tobacco Use   Smoking status: Never    Passive exposure: Never   Smokeless tobacco: Never  Vaping Use   Vaping Use: Never used  Substance and Sexual Activity   Alcohol use: No   Drug use: No   Sexual activity: Never    Birth control/protection: Surgical  Other Topics Concern   Not on file  Social History  Narrative   Widow. Lives alone.    3 children, 5 grandchildren.   Retired. Once worked in Community education officer.   Enjoys reading, watching TV.   Social Determinants of Health   Financial Resource Strain: Low Risk  (08/09/2022)   Overall Financial Resource Strain (CARDIA)    Difficulty of Paying Living Expenses: Not hard at all  Food Insecurity: No Food  Insecurity (08/09/2022)   Hunger Vital Sign    Worried About Running Out of Food in the Last Year: Never true    Ran Out of Food in the Last Year: Never true  Transportation Needs: No Transportation Needs (08/09/2022)   PRAPARE - Administrator, Civil Service (Medical): No    Lack of Transportation (Non-Medical): No  Physical Activity: Inactive (08/09/2022)   Exercise Vital Sign    Days of Exercise per Week: 0 days    Minutes of Exercise per Session: 0 min  Stress: No Stress Concern Present (08/09/2022)   [employer] of Occupational Health - Occupational Stress Questionnaire    Feeling of Stress : Not at all  Social Connections: Moderately Isolated (08/09/2022)   Social Connection and Isolation Panel [NHANES]    Frequency of Communication with Friends and Family: More than three times a week    Frequency of Social Gatherings with Friends and Family: More than three times a week    Attends Religious Services: More than 4 times per year    Active Member of [employer] or Organizations: No    Attends Banker Meetings: Never    Marital Status: Widowed    Tobacco Counseling Counseling given: Not Answered   Clinical Intake:  Pre-visit preparation completed: Yes  Pain : No/denies pain     Nutritional Risks: Nausea/ vomitting/ diarrhea (IBS) Diabetes: No  How often do you need to have someone help you when you read instructions, pamphlets, or other written materials from your doctor or pharmacy?: 1 - Never  Diabetic? no  Interpreter Needed?: No  Information entered by :: C.Rahma Meller LPN   Activities of Daily  Living    08/09/2022   12:16 PM  In your present state of health, do you have any difficulty performing the following activities:  Hearing? 1  Comment wears aids  Vision? 0  Difficulty concentrating or making decisions? 1  Comment occasionally  Walking or climbing stairs? 0  Dressing or bathing? 0  Doing errands, shopping? 0  Comment family assists  Preparing Food and eating ? N  Using the Toilet? N  In the past six months, have you accidently leaked urine? Y  Comment frequent UTI's followed by urology  Do you have problems with loss of bowel control? N  Managing your Medications? N  Managing your Finances? N  Housekeeping or managing your Housekeeping? N    Patient Care Team: Doreene Nest, NP as PCP - General (Internal Medicine) Levert Feinstein, MD (Hematology and Oncology) Marcellus Scott, OD as Referring Physician (Optometry) Nancy Marus, MD as Consulting Physician (Dermatology) Elenora Fender, MD as Attending Physician (Radiology)  Indicate any recent Medical Services you may have received from other than [facility] providers in the past year (date may be approximate).     Assessment:   This is a routine wellness examination for Teneika.  Hearing/Vision screen Hearing Screening - Comments:: Aids  Vision Screening - Comments:: Glasses - [facility]  Dietary issues and exercise activities discussed: Current Exercise Habits: The patient does not participate in regular exercise at present, Exercise limited by: None identified   Goals Addressed             This Visit's Progress    Patient Stated       No new goals       Depression Screen    08/09/2022   12:13 PM 07/25/2021   11:02 AM 12/16/2019    2:12 PM 12/17/2018  9:16 AM 12/04/2017    8:42 AM 11/21/2016    8:20 AM 09/28/2015   10:17 AM  PHQ 2/9 Scores  PHQ - 2 Score 0 0 0 1 0 2 0  PHQ- 9 Score   0  0 4     Fall Risk    08/09/2022   12:15 PM 07/25/2021   11:04 AM 02/05/2020   10:46 AM  12/16/2019    2:10 PM 12/17/2018    9:15 AM  Fall Risk   Falls in the past year? 1 1 1 1 1   Comment    tripped over speed bumps in parking lot   Number falls in past yr: 1 0 0 1 0  Injury with Fall? 0 0 1 0 0  Risk for fall due to : Impaired balance/gait;Other (Comment) History of fall(s) Impaired balance/gait;History of fall(s) Medication side effect   Risk for fall due to: Comment tripped at church, and tripped over blanket at home      Follow up Falls evaluation completed;Falls prevention discussed;Education provided Falls prevention discussed  Falls evaluation completed;Falls prevention discussed     FALL RISK PREVENTION PERTAINING TO THE HOME:  Any stairs in or around the home? No  If so, are there any without handrails? No  Home free of loose throw rugs in walkways, pet beds, electrical cords, etc? Yes  Adequate lighting in your home to reduce risk of falls? Yes   ASSISTIVE DEVICES UTILIZED TO PREVENT FALLS:  Life alert? Yes  Use of a cane, walker or w/c? Yes  Grab bars in the bathroom? Yes  Shower chair or bench in shower? No  Elevated toilet seat or a handicapped toilet? Yes    Cognitive Function:    12/16/2019    2:16 PM 12/04/2017    3:48 PM 11/21/2016    8:15 AM 09/28/2015   10:19 AM  MMSE - Mini Mental State Exam  Orientation to time 5 5 5 5   Orientation to Place 5 5 5 5   Registration 3 3 3 3   Attention/ Calculation 5 0 0 0  Recall 3 3 3 3   Language- name 2 objects  0 0 0  Language- repeat 1 1 1 1   Language- follow 3 step command  3 3 3   Language- read & follow direction  0 0 0  Write a sentence  0 0 0  Copy design  0 0 0  Total score  20 20 20         08/09/2022   12:17 PM  6CIT Screen  What Year? 0 points  What month? 0 points  What time? 0 points  Count back from 20 0 points  Months in reverse 0 points  Repeat phrase 2 points  Total Score 2 points    Immunizations Immunization History  Administered Date(s) Administered   Fluad Quad(high Dose  65+) 12/17/2018, 12/23/2019, 12/21/2020, 01/02/2022   Influenza,inj,Quad PF,6+ Mos 12/05/2016, 12/04/2017   Influenza-Unspecified 12/19/2015   PFIZER(Purple Top)SARS-COV-2 Vaccination 04/03/2019, 04/24/2019, 02/17/2020   Pneumococcal Conjugate-13 09/28/2015   Pneumococcal Polysaccharide-23 03/23/2011   Tdap 02/05/2020    TDAP status: Up to date  Flu Vaccine status: Up to date  Pneumococcal vaccine status: Up to date  Covid-19 vaccine status: Information provided on how to obtain vaccines.   Qualifies for Shingles Vaccine? Yes   Zostavax completed No   Shingrix Completed?: No.    Education has been provided regarding the importance of this vaccine. Patient has been advised to call insurance company to  determine out of pocket expense if they have not yet received this vaccine. Advised may also receive vaccine at local pharmacy or Health Dept. Verbalized acceptance and understanding.  Screening Tests Health Maintenance  Topic Date Due   Zoster Vaccines- Shingrix (1 of 2) Never done   COVID-19 Vaccine (4 - 2023-24 season) 11/17/2021   PAP SMEAR-Modifier  09/27/2048 (Originally 07/10/2012)   INFLUENZA VACCINE  10/18/2022   Medicare Annual Wellness (AWV)  08/09/2023   DTaP/Tdap/Td (2 - Td or Tdap) 02/04/2030   Pneumonia Vaccine 83+ Years old  Completed   DEXA SCAN  Completed   HPV VACCINES  Aged Out    Health Maintenance  Health Maintenance Due  Topic Date Due   Zoster Vaccines- Shingrix (1 of 2) Never done   COVID-19 Vaccine (4 - 2023-24 season) 11/17/2021    Colorectal cancer screening: No longer required.   Mammogram status: Completed 08/16/21. Repeat every year Mammogram scheduled for 08/20/22  Bone Density status: Completed 06/23/19. Results reflect: Bone density results: OSTEOPENIA. Repeat every 2 years.  Lung Cancer Screening: (Low Dose CT Chest recommended if Age 37-80 years, 30 pack-year currently smoking OR have quit w/in 15years.) does not qualify.   Lung Cancer  Screening Referral: no  Additional Screening:  Hepatitis C Screening: does not qualify; Completed no  Vision Screening: Recommended annual ophthalmology exams for early detection of glaucoma and other disorders of the eye. Is the patient up to date with their annual eye exam?  Yes  Who is the provider or what is the name of the office in which the patient attends annual eye exams? [facility] If pt is not established with a provider, would they like to be referred to a provider to establish care? No .   Dental Screening: Recommended annual dental exams for proper oral hygiene  Community Resource Referral / Chronic Care Management: CRR required this visit?  No   CCM required this visit?  No      Plan:     I have personally reviewed and noted the following in the patient's chart:   Medical and social history Use of alcohol, tobacco or illicit drugs  Current medications and supplements including opioid prescriptions. Patient is not currently taking opioid prescriptions. Functional ability and status Nutritional status Physical activity Advanced directives List of other physicians Hospitalizations, surgeries, and ER visits in previous 12 months Vitals Screenings to include cognitive, depression, and falls Referrals and appointments  In addition, I have reviewed and discussed with patient certain preventive protocols, quality metrics, and best practice recommendations. A written personalized care plan for preventive services as well as general preventive health recommendations were provided to patient.     Maryan Puls, LPN   1/61/0960   Nurse Notes: no concerns

## 2022-08-09 NOTE — Patient Instructions (Signed)
[name] , Thank you for taking time to come for your Medicare Wellness Visit. I appreciate your ongoing commitment to your health goals. Please review the following plan we discussed and let me know if I can assist you in the future.   These are the goals we discussed:  Goals      DIET - EAT MORE FRUITS AND VEGETABLES     Increase water intake     Starting 12/04/2017, I will continue to drink at least 6-8 glasses of water daily.      Patient Stated     12/16/2019,  I will maintain and continue medications as prescribed.      Patient Stated     No new goals        This is a list of the screening recommended for you and due dates:  Health Maintenance  Topic Date Due   Zoster (Shingles) Vaccine (1 of 2) Never done   COVID-19 Vaccine (4 - 2023-24 season) 11/17/2021   Pap Smear  09/27/2048*   Flu Shot  10/18/2022   Medicare Annual Wellness Visit  08/09/2023   DTaP/Tdap/Td vaccine (2 - Td or Tdap) 02/04/2030   Pneumonia Vaccine  Completed   DEXA scan (bone density measurement)  Completed   HPV Vaccine  Aged Out  *Topic was postponed. The date shown is not the original due date.    Advanced directives: Please bring a copy of your health care power of attorney and living will to the office to be added to your chart at your convenience.   Conditions/risks identified: Aim for 30 minutes of exercise or brisk walking, 6-8 glasses of water, and 5 servings of fruits and vegetables each day.   Next appointment: Follow up in one year for your annual wellness visit 08/14/23 @ 10:15 televisit   Preventive Care 65 Years and Older, Female Preventive care refers to lifestyle choices and visits with your health care provider that can promote health and wellness. What does preventive care include? A yearly physical exam. This is also called an annual well check. Dental exams once or twice a year. Routine eye exams. Ask your health care provider how often you should have your eyes  checked. Personal lifestyle choices, including: Daily care of your teeth and gums. Regular physical activity. Eating a healthy diet. Avoiding tobacco and drug use. Limiting alcohol use. Practicing safe sex. Taking low-dose aspirin every day. Taking vitamin and mineral supplements as recommended by your health care provider. What happens during an annual well check? The services and screenings done by your health care provider during your annual well check will depend on your age, overall health, lifestyle risk factors, and family history of disease. Counseling  Your health care provider may ask you questions about your: Alcohol use. Tobacco use. Drug use. Emotional well-being. Home and relationship well-being. Sexual activity. Eating habits. History of falls. Memory and ability to understand (cognition). Work and work Astronomer. Reproductive health. Screening  You may have the following tests or measurements: Height, weight, and BMI. Blood pressure. Lipid and cholesterol levels. These may be checked every 5 years, or more frequently if you are over 73 years old. Skin check. Lung cancer screening. You may have this screening every year starting at age 77 if you have a 30-pack-year history of smoking and currently smoke or have quit within the past 15 years. Fecal occult blood test (FOBT) of the stool. You may have this test every year starting at age 5. Flexible sigmoidoscopy or  colonoscopy. You may have a sigmoidoscopy every 5 years or a colonoscopy every 10 years starting at age 59. Hepatitis C blood test. Hepatitis B blood test. Sexually transmitted disease (STD) testing. Diabetes screening. This is done by checking your blood sugar (glucose) after you have not eaten for a while (fasting). You may have this done every 1-3 years. Bone density scan. This is done to screen for osteoporosis. You may have this done starting at age 81. Mammogram. This may be done every 1-2  years. Talk to your health care provider about how often you should have regular mammograms. Talk with your health care provider about your test results, treatment options, and if necessary, the need for more tests. Vaccines  Your health care provider may recommend certain vaccines, such as: Influenza vaccine. This is recommended every year. Tetanus, diphtheria, and acellular pertussis (Tdap, Td) vaccine. You may need a Td booster every 10 years. Zoster vaccine. You may need this after age 23. Pneumococcal 13-valent conjugate (PCV13) vaccine. One dose is recommended after age 58. Pneumococcal polysaccharide (PPSV23) vaccine. One dose is recommended after age 63. Talk to your health care provider about which screenings and vaccines you need and how often you need them. This information is not intended to replace advice given to you by your health care provider. Make sure you discuss any questions you have with your health care provider. Document Released: 04/01/2015 Document Revised: 11/23/2015 Document Reviewed: 01/04/2015 Elsevier Interactive Patient Education  2017 [employer].  Fall Prevention in the Home Falls can cause injuries. They can happen to people of all ages. There are many things you can do to make your home safe and to help prevent falls. What can I do on the outside of my home? Regularly fix the edges of walkways and driveways and fix any cracks. Remove anything that might make you trip as you walk through a door, such as a raised step or threshold. Trim any bushes or trees on the path to your home. Use bright outdoor lighting. Clear any walking paths of anything that might make someone trip, such as rocks or tools. Regularly check to see if handrails are loose or broken. Make sure that both sides of any steps have handrails. Any raised decks and porches should have guardrails on the edges. Have any leaves, snow, or ice cleared regularly. Use sand or salt on walking paths  during winter. Clean up any spills in your garage right away. This includes oil or grease spills. What can I do in the bathroom? Use night lights. Install grab bars by the toilet and in the tub and shower. Do not use towel bars as grab bars. Use non-skid mats or decals in the tub or shower. If you need to sit down in the shower, use a plastic, non-slip stool. Keep the floor dry. Clean up any water that spills on the floor as soon as it happens. Remove soap buildup in the tub or shower regularly. Attach bath mats securely with double-sided non-slip rug tape. Do not have throw rugs and other things on the floor that can make you trip. What can I do in the bedroom? Use night lights. Make sure that you have a light by your bed that is easy to reach. Do not use any sheets or blankets that are too big for your bed. They should not hang down onto the floor. Have a firm chair that has side arms. You can use this for support while you get dressed. Do not  have throw rugs and other things on the floor that can make you trip. What can I do in the kitchen? Clean up any spills right away. Avoid walking on wet floors. Keep items that you use a lot in easy-to-reach places. If you need to reach something above you, use a strong step stool that has a grab bar. Keep electrical cords out of the way. Do not use floor polish or wax that makes floors slippery. If you must use wax, use non-skid floor wax. Do not have throw rugs and other things on the floor that can make you trip. What can I do with my stairs? Do not leave any items on the stairs. Make sure that there are handrails on both sides of the stairs and use them. Fix handrails that are broken or loose. Make sure that handrails are as long as the stairways. Check any carpeting to make sure that it is firmly attached to the stairs. Fix any carpet that is loose or worn. Avoid having throw rugs at the top or bottom of the stairs. If you do have throw  rugs, attach them to the floor with carpet tape. Make sure that you have a light switch at the top of the stairs and the bottom of the stairs. If you do not have them, ask someone to add them for you. What else can I do to help prevent falls? Wear shoes that: Do not have high heels. Have rubber bottoms. Are comfortable and fit you well. Are closed at the toe. Do not wear sandals. If you use a stepladder: Make sure that it is fully opened. Do not climb a closed stepladder. Make sure that both sides of the stepladder are locked into place. Ask someone to hold it for you, if possible. Clearly mark and make sure that you can see: Any grab bars or handrails. First and last steps. Where the edge of each step is. Use tools that help you move around (mobility aids) if they are needed. These include: Canes. Walkers. Scooters. Crutches. Turn on the lights when you go into a dark area. Replace any light bulbs as soon as they burn out. Set up your furniture so you have a clear path. Avoid moving your furniture around. If any of your floors are uneven, fix them. If there are any pets around you, be aware of where they are. Review your medicines with your doctor. Some medicines can make you feel dizzy. This can increase your chance of falling. Ask your doctor what other things that you can do to help prevent falls. This information is not intended to replace advice given to you by your health care provider. Make sure you discuss any questions you have with your health care provider. Document Released: 12/30/2008 Document Revised: 08/11/2015 Document Reviewed: 04/09/2014 Elsevier Interactive Patient Education  2017 [employer].

## 2022-08-15 ENCOUNTER — Telehealth: Payer: Self-pay | Admitting: Primary Care

## 2022-08-15 NOTE — Telephone Encounter (Signed)
Patient states she would like to get both of these done at [facility].   Fax placed in inbox for bone density order.

## 2022-08-15 NOTE — Telephone Encounter (Signed)
From the chart, it looks like one of our nurses ordered her bone density scan last week.  Where she getting her mammogram done?  The bone density scan was ordered for the [facility] imaging breast center in [place].  She can have both mammogram and bone density done at the same time, we just need to know the preferred location.  She will also need to contact the location for which she is having her mammogram and notify them that she needs a bone density scan as well.  Let me know location.

## 2022-08-15 NOTE — Telephone Encounter (Signed)
Noted.  Form completed and placed in Kelli's inbox.

## 2022-08-15 NOTE — Telephone Encounter (Signed)
Pt called stating has a mammogram scheduled for 08/23/22. Pt states she usually has a Bone Density done every two years. Pt is asking Chestine Spore if she needs another Bone Density done & when is she due for another one? Call back # [phone]

## 2022-08-16 NOTE — Telephone Encounter (Signed)
Called and notified patient that order for bone density has been faxed over to [facility].

## 2022-08-22 DIAGNOSIS — Z1231 Encounter for screening mammogram for malignant neoplasm of breast: Secondary | ICD-10-CM | POA: Diagnosis not present

## 2022-08-23 ENCOUNTER — Encounter: Payer: Self-pay | Admitting: Primary Care

## 2022-10-01 ENCOUNTER — Other Ambulatory Visit: Payer: Medicare Other

## 2022-10-01 ENCOUNTER — Other Ambulatory Visit: Payer: Self-pay

## 2022-10-01 DIAGNOSIS — R300 Dysuria: Secondary | ICD-10-CM

## 2022-10-01 LAB — URINALYSIS, COMPLETE
Bilirubin, UA: NEGATIVE
Glucose, UA: NEGATIVE
Ketones, UA: NEGATIVE
Nitrite, UA: NEGATIVE
Specific Gravity, UA: 1.01 (ref 1.005–1.030)
Urobilinogen, Ur: 0.2 mg/dL (ref 0.2–1.0)
pH, UA: 8.5 — ABNORMAL HIGH (ref 5.0–7.5)

## 2022-10-01 LAB — MICROSCOPIC EXAMINATION: WBC, UA: >30 /hpf — AB (ref 0–5)

## 2022-10-01 MED ORDER — CEFUROXIME AXETIL 250 MG PO TABS: 250.0000 mg | ORAL_TABLET | Freq: Two times a day (BID) | ORAL | 0 refills | Status: DC

## 2022-10-01 NOTE — Progress Notes (Signed)
Patient's son contacted clinic via telephone this morning to report that she is having dysuria, malodorous urine, and frequency.  She is unable to come to clinic due to mobility issues.  We explained that we do not typically do urine drop-offs, however in the setting of her poor mobility I am willing to make an exception for now.  UA drop-off today appears grossly infected, not atypical for her at baseline.  Urine culture is pending.  Please start empiric cefuroxime 250 mg twice daily x 5 days for acute cystitis.  If her symptoms do not resolve, she will need to be seen in clinic.

## 2022-10-01 NOTE — Telephone Encounter (Signed)
 Pt aware.   Med erxed.

## 2022-10-01 NOTE — Telephone Encounter (Signed)
-----   Message from [facility] sent at 10/01/2022  5:03 PM EDT ----- Patient's son contacted clinic via telephone this morning to report that she is having dysuria, malodorous urine, and frequency.  She is unable to come to clinic due to mobility issues.  We explained that we do not typically do urine drop-offs, however in the setting of her poor mobility I am willing to make an exception for now.  UA drop-off today appears grossly infected, not atypical for her at baseline.  Urine culture is pending.  Please start empiric cefuroxime 250 mg twice daily x 5 days for acute cystitis.  If her symptoms do not resolve, she will need to be seen in clinic.

## 2022-10-05 LAB — CULTURE, URINE COMPREHENSIVE

## 2022-10-18 ENCOUNTER — Other Ambulatory Visit: Payer: Self-pay | Admitting: Primary Care

## 2022-10-18 DIAGNOSIS — E785 Hyperlipidemia, unspecified: Secondary | ICD-10-CM

## 2022-10-18 DIAGNOSIS — E039 Hypothyroidism, unspecified: Secondary | ICD-10-CM

## 2022-10-18 NOTE — Telephone Encounter (Signed)
Patient is due for follow up in October, this will be required prior to any further refills.  Please schedule, thank you!

## 2022-10-19 NOTE — Telephone Encounter (Signed)
Patient scheduled 10.16.24

## 2022-10-24 ENCOUNTER — Ambulatory Visit: Payer: Medicare Other | Admitting: Podiatry

## 2022-10-29 ENCOUNTER — Ambulatory Visit: Payer: Medicare Other | Admitting: Podiatry

## 2022-10-29 ENCOUNTER — Encounter: Payer: Self-pay | Admitting: Podiatry

## 2022-10-29 DIAGNOSIS — B351 Tinea unguium: Secondary | ICD-10-CM | POA: Diagnosis not present

## 2022-10-29 DIAGNOSIS — M79676 Pain in unspecified toe(s): Secondary | ICD-10-CM | POA: Diagnosis not present

## 2022-10-29 NOTE — Progress Notes (Signed)
She presents today chief complaint of painful elongated toenails.  Objective: Toenails are long thick yellow dystrophic length mycotic no open lesions or wounds.  Toenails on the right foot are much more thick and tender.  Pulses are palpable.  No open lesions or wounds.  Assessment: Pain in limb secondary to onychomycosis.  Plan: Debrided toenails 1 through 5 bilateral.

## 2022-11-06 ENCOUNTER — Other Ambulatory Visit: Payer: Self-pay | Admitting: Primary Care

## 2022-11-06 DIAGNOSIS — I10 Essential (primary) hypertension: Secondary | ICD-10-CM

## 2022-11-06 DIAGNOSIS — F411 Generalized anxiety disorder: Secondary | ICD-10-CM

## 2022-11-12 ENCOUNTER — Other Ambulatory Visit: Payer: Self-pay | Admitting: Primary Care

## 2022-11-12 DIAGNOSIS — I10 Essential (primary) hypertension: Secondary | ICD-10-CM

## 2023-01-02 ENCOUNTER — Encounter: Payer: Self-pay | Admitting: Primary Care

## 2023-01-02 ENCOUNTER — Ambulatory Visit: Payer: Medicare Other | Admitting: Primary Care

## 2023-01-02 VITALS — BP 146/78 | HR 84 | Temp 97.4°F | Ht 64.0 in | Wt 150.0 lb

## 2023-01-02 DIAGNOSIS — R2689 Other abnormalities of gait and mobility: Secondary | ICD-10-CM

## 2023-01-02 DIAGNOSIS — E785 Hyperlipidemia, unspecified: Secondary | ICD-10-CM

## 2023-01-02 DIAGNOSIS — Z23 Encounter for immunization: Secondary | ICD-10-CM | POA: Diagnosis not present

## 2023-01-02 DIAGNOSIS — D693 Immune thrombocytopenic purpura: Secondary | ICD-10-CM | POA: Diagnosis not present

## 2023-01-02 DIAGNOSIS — E039 Hypothyroidism, unspecified: Secondary | ICD-10-CM

## 2023-01-02 DIAGNOSIS — N3281 Overactive bladder: Secondary | ICD-10-CM

## 2023-01-02 DIAGNOSIS — R296 Repeated falls: Secondary | ICD-10-CM | POA: Diagnosis not present

## 2023-01-02 DIAGNOSIS — Z853 Personal history of malignant neoplasm of breast: Secondary | ICD-10-CM

## 2023-01-02 DIAGNOSIS — I10 Essential (primary) hypertension: Secondary | ICD-10-CM

## 2023-01-02 LAB — LIPID PANEL
Cholesterol: 131 mg/dL (ref 0–200)
HDL: 54.8 mg/dL (ref >39.00)
LDL Cholesterol: 58 mg/dL (ref 0–99)
NonHDL: 76.08
Total CHOL/HDL Ratio: 2
Triglycerides: 90 mg/dL (ref 0.0–149.0)
VLDL: 18 mg/dL (ref 0.0–40.0)

## 2023-01-02 LAB — COMPREHENSIVE METABOLIC PANEL
ALT: 16 U/L (ref 0–35)
AST: 25 U/L (ref 0–37)
Albumin: 4.3 g/dL (ref 3.5–5.2)
Alkaline Phosphatase: 72 U/L (ref 39–117)
BUN: 21 mg/dL (ref 6–23)
CO2: 29 meq/L (ref 19–32)
Calcium: 9.7 mg/dL (ref 8.4–10.5)
Chloride: 99 meq/L (ref 96–112)
Creatinine, Ser: 0.74 mg/dL (ref 0.40–1.20)
GFR: 72.79 mL/min (ref >60.00)
Glucose, Bld: 78 mg/dL (ref 70–99)
Potassium: 4 meq/L (ref 3.5–5.1)
Sodium: 136 meq/L (ref 135–145)
Total Bilirubin: 0.7 mg/dL (ref 0.2–1.2)
Total Protein: 7.3 g/dL (ref 6.0–8.3)

## 2023-01-02 LAB — CBC
HCT: 37.3 % (ref 36.0–46.0)
Hemoglobin: 12.4 g/dL (ref 12.0–15.0)
MCHC: 33.2 g/dL (ref 30.0–36.0)
MCV: 96.3 fL (ref 78.0–100.0)
Platelets: 291 10*3/uL (ref 150.0–400.0)
RBC: 3.87 Mil/uL (ref 3.87–5.11)
RDW: 13.8 % (ref 11.5–15.5)
WBC: 7.6 10*3/uL (ref 4.0–10.5)

## 2023-01-02 LAB — TSH: TSH: 3.67 u[IU]/mL (ref 0.35–5.50)

## 2023-01-02 NOTE — Progress Notes (Signed)
Subjective:    Lopez ID: Ashlee Lopez, female    DOB: 1934/02/05, 89 y.o.   MRN: [id number]  HPI  Ashlee Lopez is a very pleasant 89 y.o. female with a history of hypertension, IBS, hypothyroidism, uterine cancer, ITP, recurrent UTI who presents today for follow-up of chronic conditions.  1) Hypertension: Currently managed on amlodipine 10 mg daily, metoprolol tartrate 50 mg twice daily, losartan 100 mg daily. She denies headaches, dizziness, chest pain.   BP Readings from Last 3 Encounters:  01/02/23 (!) 146/78  04/30/22 (!) 154/82  01/02/22 (!) 142/74     2) Urinary Incontinence: Currently managed on oxybutynin XL 10 mg daily. She continues to experience urinary incontinence. Follows with Urology.   3) GAD: Chronic for years.  Currently managed on venlafaxine ER 75 mg daily. She feels well managed on her current regimen.   4) Hypothyroidism: Currently managed on levothyroxine 100 mcg tablets.  She is taking levothyroxine every morning on an empty stomach with water only.   No food or other medications for 30 minutes.   No heartburn medication, iron pills, calcium, vitamin D, or magnesium pills within four hours of taking levothyroxine.   5) Imbalance: Chronic for years. Over the last year she's become more imbalanced with several falls without injury. She overall feels weak to her bilateral lower extremities. Her endurance has decreased. She would like to complete home health physical therapy.   Review of Systems  Respiratory:  Negative for shortness of breath.   Cardiovascular:  Negative for chest pain.  Gastrointestinal:  Negative for constipation and diarrhea.  Musculoskeletal:  Positive for arthralgias.  Skin:  Positive for rash.  Neurological:  Negative for dizziness and headaches.  Psychiatric/Behavioral:  The Lopez is not nervous/anxious.          Past Medical History:  Diagnosis Date   Benign essential HTN 03/21/2011   Breast cancer, stage 1 (HCC)  02/10/2003   Right tubular breast cancer   Cancer (HCC)    Diverticulosis of colon 03/23/2011   Family history of breast cancer    Family history of colon cancer    Family history of melanoma    Family history of prostate cancer    Fibrocystic disease of breast 03/23/2011   Graves' disease with exophthalmos 03/23/2011   Hypertension    Hypothyroid 03/21/2011   IBS (irritable bowel syndrome) 03/23/2011   ITP (idiopathic thrombocytopenic purpura) 03/21/2011   Nasal fracture 01/2020   S/P splenectomy 03/21/2011   Uterus cancer (HCC) 03/23/1999   Well differentiated AdenoCA of endometrium-superficially confined   Varicose vein of leg 03/23/2011    Social History   Socioeconomic History   Marital status: Widowed    Spouse name: Not on file   Number of children: Not on file   Years of education: Not on file   Highest education level: Not on file  Occupational History   Not on file  Tobacco Use   Smoking status: Never    Passive exposure: Never   Smokeless tobacco: Never  Vaping Use   Vaping status: Never Used  Substance and Sexual Activity   Alcohol use: No   Drug use: No   Sexual activity: Never    Birth control/protection: Surgical  Other Topics Concern   Not on file  Social History Narrative   Widow. Lives alone.    3 children, 5 grandchildren.   Retired. Once worked in Community education officer.   Enjoys reading, watching TV.   Social Determinants of Health  Financial Resource Strain: Low Risk  (08/09/2022)   Overall Financial Resource Strain (CARDIA)    Difficulty of Paying Living Expenses: Not hard at all  Food Insecurity: No Food Insecurity (08/09/2022)   Hunger Vital Sign    Worried About Running Out of Food in the Last Year: Never true    Ran Out of Food in the Last Year: Never true  Transportation Needs: No Transportation Needs (08/09/2022)   PRAPARE - Administrator, Civil Service (Medical): No    Lack of Transportation (Non-Medical): No  Physical Activity: Inactive  (08/09/2022)   Exercise Vital Sign    Days of Exercise per Week: 0 days    Minutes of Exercise per Session: 0 min  Stress: No Stress Concern Present (08/09/2022)   [employer] of Occupational Health - Occupational Stress Questionnaire    Feeling of Stress : Not at all  Social Connections: Moderately Isolated (08/09/2022)   Social Connection and Isolation Panel [NHANES]    Frequency of Communication with Friends and Family: More than three times a week    Frequency of Social Gatherings with Friends and Family: More than three times a week    Attends Religious Services: More than 4 times per year    Active Member of [employer] or Organizations: No    Attends Banker Meetings: Never    Marital Status: Widowed  Intimate Partner Violence: Not At Risk (08/09/2022)   Humiliation, Afraid, Rape, and Kick questionnaire    Fear of Current or Ex-Partner: No    Emotionally Abused: No    Physically Abused: No    Sexually Abused: No    Past Surgical History:  Procedure Laterality Date   ABDOMINAL HYSTERECTOMY  2001   APPENDECTOMY  1941   BREAST SURGERY  02/17/2003   Mastectomy-Right   MASTECTOMY  2004   Dr Jamey Ripa   OOPHORECTOMY     BSO   SKIN CANCER EXCISION  2019   removal of cancer from ear   SPLENECTOMY  1986    Family History  Problem Relation Age of Onset   Breast cancer Mother        Age 33   Hypertension Father    Heart disease Father    Lung cancer Father    Breast cancer Sister        Age 34   Melanoma Sister        dx in her 68s   Dementia Sister    Heart disease Maternal Aunt    Prostate cancer Maternal Uncle    Colon cancer Maternal Grandfather    Colon cancer Maternal Uncle    Prostate cancer Maternal Uncle    Dementia Maternal Aunt    Breast cancer Cousin        maternal 2nd cousin   Breast cancer Cousin        maternal first cousin dx in her 73s   Melanoma Son        dx in his 71s    Allergies  Allergen Reactions    Sulfamethoxazole-Trimethoprim Hives   Septra [Bactrim]     Current Outpatient Medications on File Prior to Visit  Medication Sig Dispense Refill   amLODipine (NORVASC) 10 MG tablet TAKE ONE TABLET BY MOUTH DAILY FOR BLOOD PRESSURE 90 tablet 0   aspirin 81 MG tablet Take 81 mg by mouth daily.     Cholecalciferol (VITAMIN D-3) 25 MCG (1000 UT) CAPS Take by mouth.     CRANBERRY PO Take 3 tablets  by mouth daily.     D-MANNOSE PO Take by mouth.     Diphenhydramine-APAP, sleep, (TYLENOL PM EXTRA STRENGTH PO) Take by mouth.     estradiol (ESTRACE VAGINAL) 0.1 MG/GM vaginal cream Apply 0.5mg  (pea-sized amount)  just inside the vaginal introitus with a finger-tip on Monday, Wednesday and Friday nights. 30 g 12   levothyroxine (SYNTHROID) 100 MCG tablet TAKE ONE TAB BY MOUTH ONCE DAILY. TAKE ON AN EMPTY STOMACH WITH A GLASS OF WATER ATLEAST 30-60 MINUTES BEFORE BREAKFAST 90 tablet 0   losartan (COZAAR) 100 MG tablet TAKE ONE TABLET BY MOUTH ONCE A DAY FOR HIGH BLOOD PRESSURE. 90 tablet 0   metoprolol tartrate (LOPRESSOR) 50 MG tablet TAKE ONE TABLET BY MOUTH TWICE A DAY FOR BLOOD PRESSURE. 180 tablet 0   oxybutynin (DITROPAN-XL) 10 MG 24 hr tablet Take 1 tablet (10 mg total) by mouth at bedtime. 90 tablet 3   RA CRANBERRY 500 MG CAPS SMARTSIG:1 By Mouth     rosuvastatin (CRESTOR) 5 MG tablet Take 1 tablet (5 mg total) by mouth every other day. for cholesterol. 45 tablet 0   TYLENOL 500 MG tablet SMARTSIG:1 By Mouth     venlafaxine XR (EFFEXOR-XR) 75 MG 24 hr capsule TAKE ONE CAPSULE BY MOUTH ONCE DAILY WITH BREAKFAST FOR ANXIETY 90 capsule 0   Zinc 50 MG TABS Take by mouth.     omeprazole (PRILOSEC) 20 MG capsule TAKE 1 CAPSULE BY MOUTH ONCE DAILY FOR HEARTBURN (Lopez not taking: Reported on 01/02/2023) 90 capsule 1   No current facility-administered medications on file prior to visit.    BP (!) 146/78   Pulse 84   Temp (!) 97.4 F (36.3 C) (Temporal)   Ht 5\' 4"  (1.626 m)   Wt 150 lb (68 kg)    SpO2 98%   BMI 25.75 kg/m  Objective:   Physical Exam Cardiovascular:     Rate and Rhythm: Normal rate and regular rhythm.  Pulmonary:     Effort: Pulmonary effort is normal.     Breath sounds: Normal breath sounds.  Chest:  Breasts:    Right: Absent.     Left: No swelling, mass, skin change or tenderness.  Musculoskeletal:     Cervical back: Neck supple.  Lymphadenopathy:     Upper Body:     Right upper body: No axillary adenopathy.     Left upper body: No axillary adenopathy.  Skin:    General: Skin is warm and dry.  Neurological:     Mental Status: She is alert and oriented to person, place, and time.  Psychiatric:        Mood and Affect: Mood normal.           Assessment & Plan:  Hyperlipidemia, unspecified hyperlipidemia type Assessment & Plan: Repeat lipid panel pending. Continue rosuvastatin 5 mg daily.   Orders: -     Lipid panel -     Comprehensive metabolic panel  Overactive bladder Assessment & Plan: Following with Urology. Continue oxybutynin ER 10 mg daily.   Hypothyroidism, unspecified type Assessment & Plan: She is taking levothyroxine correctly.  Continue levothyroxine 100 mcg daily. Repeat TSH pending.  Orders: -     TSH  Idiopathic thrombocytopenic purpura (HCC) Assessment & Plan: No longer following with hematology. Repeat CBC pending.  Orders: -     CBC  Imbalance -     Ambulatory referral to Home Health  Recurrent falls Assessment & Plan: Without injury.  Referral placed to home  health physical therapy.  Orders: -     Ambulatory referral to Home Health  History of breast cancer, invasive mammary right  Assessment & Plan: Mammogram UTD. Continue annual screenings.  Breast exam today completed.    Benign essential HTN Assessment & Plan: Stable for age and frailty.  Continue losartan 100 mg daily, amlodipine 10 mg daily, metoprolol titrate 50 mg twice daily.         Doreene Nest, NP

## 2023-01-02 NOTE — Assessment & Plan Note (Signed)
Mammogram UTD. Continue annual screenings.  Breast exam today completed.

## 2023-01-02 NOTE — Assessment & Plan Note (Signed)
She is taking levothyroxine correctly. Continue levothyroxine 100 mcg daily.  Repeat TSH pending.

## 2023-01-02 NOTE — Patient Instructions (Signed)
Stop by the lab prior to leaving today. I will notify you of your results once received.   You will be contacted via phone regarding your referral to home health physical therapy.  It was a pleasure to see you today!

## 2023-01-02 NOTE — Assessment & Plan Note (Signed)
No longer following with hematology. Repeat CBC pending.

## 2023-01-02 NOTE — Assessment & Plan Note (Signed)
Stable for age and frailty.  Continue losartan 100 mg daily, amlodipine 10 mg daily, metoprolol titrate 50 mg twice daily.

## 2023-01-02 NOTE — Assessment & Plan Note (Signed)
Repeat lipid panel pending. Continue rosuvastatin 5 mg daily.

## 2023-01-02 NOTE — Assessment & Plan Note (Signed)
Following with Urology. Continue oxybutynin ER 10 mg daily.

## 2023-01-02 NOTE — Assessment & Plan Note (Signed)
Without injury.  Referral placed to home health physical therapy.

## 2023-01-03 ENCOUNTER — Telehealth: Payer: Self-pay | Admitting: Primary Care

## 2023-01-03 DIAGNOSIS — Z90710 Acquired absence of both cervix and uterus: Secondary | ICD-10-CM | POA: Diagnosis not present

## 2023-01-03 DIAGNOSIS — R339 Retention of urine, unspecified: Secondary | ICD-10-CM | POA: Diagnosis not present

## 2023-01-03 DIAGNOSIS — Z974 Presence of external hearing-aid: Secondary | ICD-10-CM | POA: Diagnosis not present

## 2023-01-03 DIAGNOSIS — Z8542 Personal history of malignant neoplasm of other parts of uterus: Secondary | ICD-10-CM | POA: Diagnosis not present

## 2023-01-03 DIAGNOSIS — D693 Immune thrombocytopenic purpura: Secondary | ICD-10-CM | POA: Diagnosis not present

## 2023-01-03 DIAGNOSIS — Z5982 Transportation insecurity: Secondary | ICD-10-CM | POA: Diagnosis not present

## 2023-01-03 DIAGNOSIS — I10 Essential (primary) hypertension: Secondary | ICD-10-CM | POA: Diagnosis not present

## 2023-01-03 DIAGNOSIS — M48061 Spinal stenosis, lumbar region without neurogenic claudication: Secondary | ICD-10-CM | POA: Diagnosis not present

## 2023-01-03 DIAGNOSIS — K589 Irritable bowel syndrome without diarrhea: Secondary | ICD-10-CM | POA: Diagnosis not present

## 2023-01-03 DIAGNOSIS — Z853 Personal history of malignant neoplasm of breast: Secondary | ICD-10-CM | POA: Diagnosis not present

## 2023-01-03 DIAGNOSIS — F411 Generalized anxiety disorder: Secondary | ICD-10-CM | POA: Diagnosis not present

## 2023-01-03 DIAGNOSIS — Z87440 Personal history of urinary (tract) infections: Secondary | ICD-10-CM | POA: Diagnosis not present

## 2023-01-03 DIAGNOSIS — Z9049 Acquired absence of other specified parts of digestive tract: Secondary | ICD-10-CM | POA: Diagnosis not present

## 2023-01-03 DIAGNOSIS — N3281 Overactive bladder: Secondary | ICD-10-CM | POA: Diagnosis not present

## 2023-01-03 DIAGNOSIS — Z85828 Personal history of other malignant neoplasm of skin: Secondary | ICD-10-CM | POA: Diagnosis not present

## 2023-01-03 DIAGNOSIS — Z9011 Acquired absence of right breast and nipple: Secondary | ICD-10-CM | POA: Diagnosis not present

## 2023-01-03 DIAGNOSIS — H9193 Unspecified hearing loss, bilateral: Secondary | ICD-10-CM | POA: Diagnosis not present

## 2023-01-03 DIAGNOSIS — E039 Hypothyroidism, unspecified: Secondary | ICD-10-CM | POA: Diagnosis not present

## 2023-01-03 DIAGNOSIS — Z7982 Long term (current) use of aspirin: Secondary | ICD-10-CM | POA: Diagnosis not present

## 2023-01-03 DIAGNOSIS — E785 Hyperlipidemia, unspecified: Secondary | ICD-10-CM | POA: Diagnosis not present

## 2023-01-03 DIAGNOSIS — Z9181 History of falling: Secondary | ICD-10-CM | POA: Diagnosis not present

## 2023-01-03 NOTE — Telephone Encounter (Signed)
Approved.  

## 2023-01-03 NOTE — Telephone Encounter (Signed)
Home Health verbal orders Caller Name: Romilda Garret, [place] Agency Name: [facility]   Callback number: [phone]  Requesting OT/PT/Skilled nursing/Social Work/Speech: PT  Reason: Strength and gait training  Frequency: 1wk 8  Please forward to [facility] pool or providers CMA  Soni also wanted to confirm a diagnosis of Spinal Stenosis with pcp, please advise with this information as well.

## 2023-01-03 NOTE — Telephone Encounter (Signed)
Verbal orders given to [facility]

## 2023-01-07 DIAGNOSIS — E785 Hyperlipidemia, unspecified: Secondary | ICD-10-CM | POA: Diagnosis not present

## 2023-01-07 DIAGNOSIS — I10 Essential (primary) hypertension: Secondary | ICD-10-CM | POA: Diagnosis not present

## 2023-01-07 DIAGNOSIS — K589 Irritable bowel syndrome without diarrhea: Secondary | ICD-10-CM | POA: Diagnosis not present

## 2023-01-07 DIAGNOSIS — F411 Generalized anxiety disorder: Secondary | ICD-10-CM | POA: Diagnosis not present

## 2023-01-07 DIAGNOSIS — E039 Hypothyroidism, unspecified: Secondary | ICD-10-CM | POA: Diagnosis not present

## 2023-01-07 DIAGNOSIS — N3281 Overactive bladder: Secondary | ICD-10-CM | POA: Diagnosis not present

## 2023-01-16 ENCOUNTER — Telehealth: Payer: Self-pay | Admitting: Primary Care

## 2023-01-16 ENCOUNTER — Other Ambulatory Visit: Payer: Self-pay | Admitting: Primary Care

## 2023-01-16 DIAGNOSIS — K589 Irritable bowel syndrome without diarrhea: Secondary | ICD-10-CM | POA: Diagnosis not present

## 2023-01-16 DIAGNOSIS — E039 Hypothyroidism, unspecified: Secondary | ICD-10-CM | POA: Diagnosis not present

## 2023-01-16 DIAGNOSIS — E785 Hyperlipidemia, unspecified: Secondary | ICD-10-CM | POA: Diagnosis not present

## 2023-01-16 DIAGNOSIS — N3281 Overactive bladder: Secondary | ICD-10-CM | POA: Diagnosis not present

## 2023-01-16 DIAGNOSIS — I10 Essential (primary) hypertension: Secondary | ICD-10-CM | POA: Diagnosis not present

## 2023-01-16 DIAGNOSIS — F411 Generalized anxiety disorder: Secondary | ICD-10-CM | POA: Diagnosis not present

## 2023-01-16 NOTE — Telephone Encounter (Signed)
Please call patient:  Please check on her hip pain.  Recommend office evaluation for continued/worsening pain.

## 2023-01-16 NOTE — Telephone Encounter (Signed)
Dot Lanes from [facility] called to report a fall the pt had on last Mon, 10/28. Dot Lanes states the pt fell in her kitchen while doing laundry. Dot Lanes states the pt complained about some hip pain & ranked pain as 3/10. Dot Lanes states the pt was able to do her PT on today, 10/30. Dot Lanes wanted to ask Chestine Spore if she thought it was necessary for the pt to be evaluated? Dot Lanes states if a pt is needed, contact pt's son to schedule. Call back # [phone]

## 2023-01-17 DIAGNOSIS — E039 Hypothyroidism, unspecified: Secondary | ICD-10-CM | POA: Diagnosis not present

## 2023-01-17 DIAGNOSIS — K589 Irritable bowel syndrome without diarrhea: Secondary | ICD-10-CM | POA: Diagnosis not present

## 2023-01-17 DIAGNOSIS — I10 Essential (primary) hypertension: Secondary | ICD-10-CM | POA: Diagnosis not present

## 2023-01-17 DIAGNOSIS — F411 Generalized anxiety disorder: Secondary | ICD-10-CM | POA: Diagnosis not present

## 2023-01-17 DIAGNOSIS — E785 Hyperlipidemia, unspecified: Secondary | ICD-10-CM | POA: Diagnosis not present

## 2023-01-17 DIAGNOSIS — N3281 Overactive bladder: Secondary | ICD-10-CM | POA: Diagnosis not present

## 2023-01-17 NOTE — Telephone Encounter (Signed)
Noted and glad to hear! 

## 2023-01-17 NOTE — Telephone Encounter (Signed)
Called and spoke with patient, she states her hip is doing fine, just bruised from her fall. She is not having difficulty walking. She will schedule an appointment If she continues to have pain or it worsens.

## 2023-01-18 ENCOUNTER — Telehealth: Payer: Self-pay | Admitting: Primary Care

## 2023-01-18 NOTE — Telephone Encounter (Signed)
Tried to call but no answer and VM not set up

## 2023-01-18 NOTE — Telephone Encounter (Signed)
Approved.  

## 2023-01-18 NOTE — Telephone Encounter (Signed)
Home Health verbal orders Caller Name: Agency Name: [facility]  Callback number: [phone]  Requesting OT  Frequency:1 x  week for 6 wks  Please forward to [facility] pool or providers CMA

## 2023-01-21 DIAGNOSIS — E039 Hypothyroidism, unspecified: Secondary | ICD-10-CM | POA: Diagnosis not present

## 2023-01-21 DIAGNOSIS — K589 Irritable bowel syndrome without diarrhea: Secondary | ICD-10-CM | POA: Diagnosis not present

## 2023-01-21 DIAGNOSIS — F411 Generalized anxiety disorder: Secondary | ICD-10-CM | POA: Diagnosis not present

## 2023-01-21 DIAGNOSIS — N3281 Overactive bladder: Secondary | ICD-10-CM | POA: Diagnosis not present

## 2023-01-21 DIAGNOSIS — I10 Essential (primary) hypertension: Secondary | ICD-10-CM | POA: Diagnosis not present

## 2023-01-21 DIAGNOSIS — E785 Hyperlipidemia, unspecified: Secondary | ICD-10-CM | POA: Diagnosis not present

## 2023-01-21 NOTE — Telephone Encounter (Signed)
Tried to call but no answer and VM not set up

## 2023-01-22 NOTE — Telephone Encounter (Signed)
Called left verbal on secured voicemail for stephanie. Requested call back if any questions.

## 2023-01-23 DIAGNOSIS — N3281 Overactive bladder: Secondary | ICD-10-CM | POA: Diagnosis not present

## 2023-01-23 DIAGNOSIS — F411 Generalized anxiety disorder: Secondary | ICD-10-CM | POA: Diagnosis not present

## 2023-01-23 DIAGNOSIS — E039 Hypothyroidism, unspecified: Secondary | ICD-10-CM | POA: Diagnosis not present

## 2023-01-23 DIAGNOSIS — K589 Irritable bowel syndrome without diarrhea: Secondary | ICD-10-CM | POA: Diagnosis not present

## 2023-01-23 DIAGNOSIS — E785 Hyperlipidemia, unspecified: Secondary | ICD-10-CM | POA: Diagnosis not present

## 2023-01-23 DIAGNOSIS — I10 Essential (primary) hypertension: Secondary | ICD-10-CM | POA: Diagnosis not present

## 2023-01-28 ENCOUNTER — Other Ambulatory Visit: Payer: Self-pay | Admitting: Primary Care

## 2023-01-28 DIAGNOSIS — E785 Hyperlipidemia, unspecified: Secondary | ICD-10-CM | POA: Diagnosis not present

## 2023-01-28 DIAGNOSIS — N3281 Overactive bladder: Secondary | ICD-10-CM | POA: Diagnosis not present

## 2023-01-28 DIAGNOSIS — I10 Essential (primary) hypertension: Secondary | ICD-10-CM | POA: Diagnosis not present

## 2023-01-28 DIAGNOSIS — K589 Irritable bowel syndrome without diarrhea: Secondary | ICD-10-CM | POA: Diagnosis not present

## 2023-01-28 DIAGNOSIS — F411 Generalized anxiety disorder: Secondary | ICD-10-CM

## 2023-01-28 DIAGNOSIS — E039 Hypothyroidism, unspecified: Secondary | ICD-10-CM | POA: Diagnosis not present

## 2023-01-29 DIAGNOSIS — F411 Generalized anxiety disorder: Secondary | ICD-10-CM | POA: Diagnosis not present

## 2023-01-29 DIAGNOSIS — E039 Hypothyroidism, unspecified: Secondary | ICD-10-CM | POA: Diagnosis not present

## 2023-01-29 DIAGNOSIS — I10 Essential (primary) hypertension: Secondary | ICD-10-CM | POA: Diagnosis not present

## 2023-01-29 DIAGNOSIS — K589 Irritable bowel syndrome without diarrhea: Secondary | ICD-10-CM | POA: Diagnosis not present

## 2023-01-29 DIAGNOSIS — E785 Hyperlipidemia, unspecified: Secondary | ICD-10-CM | POA: Diagnosis not present

## 2023-01-29 DIAGNOSIS — N3281 Overactive bladder: Secondary | ICD-10-CM | POA: Diagnosis not present

## 2023-01-30 ENCOUNTER — Ambulatory Visit (INDEPENDENT_AMBULATORY_CARE_PROVIDER_SITE_OTHER): Payer: Medicare Other | Admitting: Podiatry

## 2023-01-30 DIAGNOSIS — M79676 Pain in unspecified toe(s): Secondary | ICD-10-CM

## 2023-01-30 DIAGNOSIS — B351 Tinea unguium: Secondary | ICD-10-CM | POA: Diagnosis not present

## 2023-01-30 NOTE — Progress Notes (Signed)
She presents today chief complaint of painful elongated toenails.  Objective: Toenails are long thick yellow dystrophic like mycotic painful palpation as well as debridement.  Assessment: Pain limb secondary to onychomycosis.  Plan: Debridement of toenails 1 through 5 bilateral.

## 2023-02-02 DIAGNOSIS — D693 Immune thrombocytopenic purpura: Secondary | ICD-10-CM | POA: Diagnosis not present

## 2023-02-02 DIAGNOSIS — M48061 Spinal stenosis, lumbar region without neurogenic claudication: Secondary | ICD-10-CM | POA: Diagnosis not present

## 2023-02-02 DIAGNOSIS — Z5982 Transportation insecurity: Secondary | ICD-10-CM | POA: Diagnosis not present

## 2023-02-02 DIAGNOSIS — R339 Retention of urine, unspecified: Secondary | ICD-10-CM | POA: Diagnosis not present

## 2023-02-02 DIAGNOSIS — Z9011 Acquired absence of right breast and nipple: Secondary | ICD-10-CM | POA: Diagnosis not present

## 2023-02-02 DIAGNOSIS — Z9181 History of falling: Secondary | ICD-10-CM | POA: Diagnosis not present

## 2023-02-02 DIAGNOSIS — Z974 Presence of external hearing-aid: Secondary | ICD-10-CM | POA: Diagnosis not present

## 2023-02-02 DIAGNOSIS — Z85828 Personal history of other malignant neoplasm of skin: Secondary | ICD-10-CM | POA: Diagnosis not present

## 2023-02-02 DIAGNOSIS — H9193 Unspecified hearing loss, bilateral: Secondary | ICD-10-CM | POA: Diagnosis not present

## 2023-02-02 DIAGNOSIS — E785 Hyperlipidemia, unspecified: Secondary | ICD-10-CM | POA: Diagnosis not present

## 2023-02-02 DIAGNOSIS — N3281 Overactive bladder: Secondary | ICD-10-CM | POA: Diagnosis not present

## 2023-02-02 DIAGNOSIS — Z90710 Acquired absence of both cervix and uterus: Secondary | ICD-10-CM | POA: Diagnosis not present

## 2023-02-02 DIAGNOSIS — Z8542 Personal history of malignant neoplasm of other parts of uterus: Secondary | ICD-10-CM | POA: Diagnosis not present

## 2023-02-02 DIAGNOSIS — Z7982 Long term (current) use of aspirin: Secondary | ICD-10-CM | POA: Diagnosis not present

## 2023-02-02 DIAGNOSIS — I10 Essential (primary) hypertension: Secondary | ICD-10-CM | POA: Diagnosis not present

## 2023-02-02 DIAGNOSIS — Z853 Personal history of malignant neoplasm of breast: Secondary | ICD-10-CM | POA: Diagnosis not present

## 2023-02-02 DIAGNOSIS — Z9049 Acquired absence of other specified parts of digestive tract: Secondary | ICD-10-CM | POA: Diagnosis not present

## 2023-02-02 DIAGNOSIS — K589 Irritable bowel syndrome without diarrhea: Secondary | ICD-10-CM | POA: Diagnosis not present

## 2023-02-02 DIAGNOSIS — Z87440 Personal history of urinary (tract) infections: Secondary | ICD-10-CM | POA: Diagnosis not present

## 2023-02-02 DIAGNOSIS — E039 Hypothyroidism, unspecified: Secondary | ICD-10-CM | POA: Diagnosis not present

## 2023-02-02 DIAGNOSIS — F411 Generalized anxiety disorder: Secondary | ICD-10-CM | POA: Diagnosis not present

## 2023-02-04 DIAGNOSIS — N3281 Overactive bladder: Secondary | ICD-10-CM | POA: Diagnosis not present

## 2023-02-04 DIAGNOSIS — E039 Hypothyroidism, unspecified: Secondary | ICD-10-CM | POA: Diagnosis not present

## 2023-02-04 DIAGNOSIS — F411 Generalized anxiety disorder: Secondary | ICD-10-CM | POA: Diagnosis not present

## 2023-02-04 DIAGNOSIS — K589 Irritable bowel syndrome without diarrhea: Secondary | ICD-10-CM | POA: Diagnosis not present

## 2023-02-04 DIAGNOSIS — I10 Essential (primary) hypertension: Secondary | ICD-10-CM | POA: Diagnosis not present

## 2023-02-04 DIAGNOSIS — E785 Hyperlipidemia, unspecified: Secondary | ICD-10-CM | POA: Diagnosis not present

## 2023-02-06 DIAGNOSIS — N3281 Overactive bladder: Secondary | ICD-10-CM | POA: Diagnosis not present

## 2023-02-06 DIAGNOSIS — E785 Hyperlipidemia, unspecified: Secondary | ICD-10-CM | POA: Diagnosis not present

## 2023-02-06 DIAGNOSIS — I10 Essential (primary) hypertension: Secondary | ICD-10-CM | POA: Diagnosis not present

## 2023-02-06 DIAGNOSIS — E039 Hypothyroidism, unspecified: Secondary | ICD-10-CM | POA: Diagnosis not present

## 2023-02-06 DIAGNOSIS — F411 Generalized anxiety disorder: Secondary | ICD-10-CM | POA: Diagnosis not present

## 2023-02-06 DIAGNOSIS — K589 Irritable bowel syndrome without diarrhea: Secondary | ICD-10-CM | POA: Diagnosis not present

## 2023-02-11 ENCOUNTER — Other Ambulatory Visit: Payer: Self-pay | Admitting: Primary Care

## 2023-02-11 DIAGNOSIS — E039 Hypothyroidism, unspecified: Secondary | ICD-10-CM | POA: Diagnosis not present

## 2023-02-11 DIAGNOSIS — K589 Irritable bowel syndrome without diarrhea: Secondary | ICD-10-CM | POA: Diagnosis not present

## 2023-02-11 DIAGNOSIS — I10 Essential (primary) hypertension: Secondary | ICD-10-CM

## 2023-02-11 DIAGNOSIS — N3281 Overactive bladder: Secondary | ICD-10-CM | POA: Diagnosis not present

## 2023-02-11 DIAGNOSIS — E785 Hyperlipidemia, unspecified: Secondary | ICD-10-CM | POA: Diagnosis not present

## 2023-02-11 DIAGNOSIS — F411 Generalized anxiety disorder: Secondary | ICD-10-CM | POA: Diagnosis not present

## 2023-02-12 ENCOUNTER — Other Ambulatory Visit: Payer: Self-pay | Admitting: Primary Care

## 2023-02-12 DIAGNOSIS — I10 Essential (primary) hypertension: Secondary | ICD-10-CM | POA: Diagnosis not present

## 2023-02-12 DIAGNOSIS — K589 Irritable bowel syndrome without diarrhea: Secondary | ICD-10-CM | POA: Diagnosis not present

## 2023-02-12 DIAGNOSIS — F411 Generalized anxiety disorder: Secondary | ICD-10-CM | POA: Diagnosis not present

## 2023-02-12 DIAGNOSIS — N3281 Overactive bladder: Secondary | ICD-10-CM | POA: Diagnosis not present

## 2023-02-12 DIAGNOSIS — E785 Hyperlipidemia, unspecified: Secondary | ICD-10-CM | POA: Diagnosis not present

## 2023-02-12 DIAGNOSIS — E039 Hypothyroidism, unspecified: Secondary | ICD-10-CM | POA: Diagnosis not present

## 2023-02-13 ENCOUNTER — Ambulatory Visit (INDEPENDENT_AMBULATORY_CARE_PROVIDER_SITE_OTHER): Payer: Medicare Other | Admitting: Audiology

## 2023-02-13 DIAGNOSIS — I10 Essential (primary) hypertension: Secondary | ICD-10-CM | POA: Diagnosis not present

## 2023-02-13 DIAGNOSIS — F411 Generalized anxiety disorder: Secondary | ICD-10-CM | POA: Diagnosis not present

## 2023-02-13 DIAGNOSIS — N3281 Overactive bladder: Secondary | ICD-10-CM | POA: Diagnosis not present

## 2023-02-13 DIAGNOSIS — E785 Hyperlipidemia, unspecified: Secondary | ICD-10-CM | POA: Diagnosis not present

## 2023-02-13 DIAGNOSIS — K589 Irritable bowel syndrome without diarrhea: Secondary | ICD-10-CM | POA: Diagnosis not present

## 2023-02-13 DIAGNOSIS — E039 Hypothyroidism, unspecified: Secondary | ICD-10-CM | POA: Diagnosis not present

## 2023-02-19 DIAGNOSIS — I10 Essential (primary) hypertension: Secondary | ICD-10-CM | POA: Diagnosis not present

## 2023-02-19 DIAGNOSIS — E039 Hypothyroidism, unspecified: Secondary | ICD-10-CM | POA: Diagnosis not present

## 2023-02-19 DIAGNOSIS — K589 Irritable bowel syndrome without diarrhea: Secondary | ICD-10-CM | POA: Diagnosis not present

## 2023-02-19 DIAGNOSIS — E785 Hyperlipidemia, unspecified: Secondary | ICD-10-CM | POA: Diagnosis not present

## 2023-02-19 DIAGNOSIS — F411 Generalized anxiety disorder: Secondary | ICD-10-CM | POA: Diagnosis not present

## 2023-02-19 DIAGNOSIS — N3281 Overactive bladder: Secondary | ICD-10-CM | POA: Diagnosis not present

## 2023-02-20 ENCOUNTER — Ambulatory Visit (INDEPENDENT_AMBULATORY_CARE_PROVIDER_SITE_OTHER): Payer: Medicare Other

## 2023-02-20 ENCOUNTER — Encounter (INDEPENDENT_AMBULATORY_CARE_PROVIDER_SITE_OTHER): Payer: Self-pay

## 2023-02-20 ENCOUNTER — Ambulatory Visit (INDEPENDENT_AMBULATORY_CARE_PROVIDER_SITE_OTHER): Payer: Self-pay | Admitting: Audiology

## 2023-02-20 ENCOUNTER — Ambulatory Visit (INDEPENDENT_AMBULATORY_CARE_PROVIDER_SITE_OTHER): Payer: Medicare Other | Admitting: Audiology

## 2023-02-20 VITALS — Ht 64.0 in | Wt 150.0 lb

## 2023-02-20 DIAGNOSIS — H903 Sensorineural hearing loss, bilateral: Secondary | ICD-10-CM | POA: Diagnosis not present

## 2023-02-20 NOTE — Progress Notes (Signed)
Patient was seen today for a hearing aid check. Her family reported that she is not understanding as well with the devices. Replaced the wax filters and domes on both hearing aids. Connected her to the software and completed a software update. Updated the hearing aids to reflect her new hearing test and ran Armed forces logistics/support/administrative officer. She noticed an improvement in the sound volume and clarity. Showed the patient and her family how to clean the devices; practiced in office. Instructed her to call if any concerns arise.   Conley Rolls Thayer Inabinet, AUD, CCC-A 02/20/23

## 2023-02-20 NOTE — Progress Notes (Addendum)
  [address], [address] [place], [place] [address] [phone]  Audiological Evaluation    Name: Ashlee Lopez     DOB:   [birth year]      MRN:   [id number]                                                                                     Service Date: 02/20/2023        Patient was referred today for a hearing evaluation by Dr. Karle Barr.   Symptoms Yes Details  Hearing loss  [x]  Patient reported perceiving hearing loss, where the left ear is worse than the right ear.  Tinnitus  [x]  Patient reported experiencing intermittent tinnitus.  Balance problems  []  Patient denied vertigo sensations.  Previous ear surgeries  []  Patient denied any previous ear surgeries.  Family history  []  Patient denied family history of hearing loss.  Amplification  [x]  Patient reported the use of bilateral Oticon hearing aids.    Otoscopy: Right ear: Clear external ear canal; notable landmarks visualized on the tympanic membrane. Left ear: Clear external ear canal; notable landmarks visualized on the tympanic membrane.    Tympanogram: Right ear: Normal external ear canal volume with normal middle ear pressure and tympanic membrane compliance (Type A). Left ear: Normal external ear canal volume with normal middle ear pressure and tympanic membrane compliance (Type A).    Hearing Evaluation: The audiogram was completed using conventional audiometric techniques under headphones with good reliability.   The hearing test results indicate: Right ear: Mild sensorineural hearing loss from [phone] gradually sloping to profound sensorineural hearing loss from 2000-8000 Hz. Left ear: Mild sensorineural hearing loss from [phone] Hz gradually sloping to profound sensorineural hearing loss from 1500-8000 Hz. Did not mask for bone conduction at 3000 and 4000 Hz due to tolerance levels. Asymmetry noted from 1500-4000 Hz, where the left ear is worse than the right ear.  Speech Audiometry: Right ear-  Speech Reception Threshold (SRT) was obtained at 40 dBHL. Left ear- Speech Reception Threshold (SRT) was obtained at 55 dBHL masked.   Word Recognition Score Tested using NU-6 (MLV) Right ear: 60% was obtained at a presentation level of 80 dBHL which is deemed as fair understanding. Left ear: 40% was obtained at a presentation level of 85 dBHL with contralateral masking which is deemed as poor understanding.    Recommendations: Repeat audiogram when changes are perceived or per MD. Continue use of hearing aids. Consider various tinnitus strategies, including the use of a noise generator, hearing aids, or tinnitus retraining therapy.   Conley Rolls Keidrick Murty, AUD, CCC-A 02/20/23

## 2023-02-21 DIAGNOSIS — H903 Sensorineural hearing loss, bilateral: Secondary | ICD-10-CM | POA: Insufficient documentation

## 2023-02-21 NOTE — Progress Notes (Signed)
Patient ID: Ashlee Lopez, female   DOB: [birth year], 89 y.o.   MRN: [id number]  Follow-up: Bilateral hearing loss  HPI: The patient is an 89 year old female who presents today for her follow-up evaluation.  She was last seen 1 year ago.  At that time, she was complaining of bilateral progressive hearing loss.  She was noted to have bilateral high-frequency sensorineural hearing loss.  She was fitted with bilateral hearing aids.  According to the patient, she continues to have hearing difficulty, especially in noisy environments.  She denies any otalgia, otorrhea, or vertigo.  Exam: General: Communicates without difficulty, well nourished, no acute distress. Head: Normocephalic, no evidence injury, no tenderness, facial buttresses intact without stepoff. Face/sinus: No tenderness to palpation and percussion. Facial movement is normal and symmetric. Eyes: PERRL, EOMI. No scleral icterus, conjunctivae clear. Neuro: CN II exam reveals vision grossly intact.  No nystagmus at any point of gaze. Ears: Auricles well formed without lesions.  Ear canals are intact without mass or lesion.  No erythema or edema is appreciated.  The TMs are intact without fluid. Nose: External evaluation reveals normal support and skin without lesions.  Dorsum is intact.  Anterior rhinoscopy reveals normal mucosa over anterior aspect of inferior turbinates and intact septum.  No purulence noted. Oral:  Oral cavity and oropharynx are intact, symmetric, without erythema or edema.  Mucosa is moist without lesions. Neck: Full range of motion without pain.  There is no significant lymphadenopathy.  No masses palpable.  Thyroid bed within normal limits to palpation.  Parotid glands and submandibular glands equal bilaterally without mass.  Trachea is midline. Neuro:  CN 2-12 grossly intact.   Her hearing test shows stable bilateral high-frequency sensorineural hearing loss.  Assessment: 1.  Stable bilateral high-frequency sensorineural  hearing loss, likely secondary to routine presbycusis. 2.  Her ear canals, tympanic membranes, and middle ear spaces are normal.  Plan: 1.  The physical exam findings and the hearing test results are reviewed with the patient. 2.  Continue the use of her hearing aids. 3.  The patient is reassured that her hearing thresholds are stable. 4.  The patient will return for reevaluation in 1 year.

## 2023-03-01 ENCOUNTER — Encounter: Payer: Self-pay | Admitting: Audiology

## 2023-04-22 ENCOUNTER — Ambulatory Visit: Payer: Medicare Other | Admitting: Physician Assistant

## 2023-04-22 ENCOUNTER — Other Ambulatory Visit: Payer: Self-pay | Admitting: Primary Care

## 2023-04-22 ENCOUNTER — Ambulatory Visit: Payer: Medicare Other | Admitting: Urology

## 2023-04-22 ENCOUNTER — Telehealth: Payer: Self-pay | Admitting: Physician Assistant

## 2023-04-22 VITALS — BP 148/81 | HR 75 | Ht 64.0 in | Wt 152.0 lb

## 2023-04-22 DIAGNOSIS — N3281 Overactive bladder: Secondary | ICD-10-CM | POA: Diagnosis not present

## 2023-04-22 DIAGNOSIS — R351 Nocturia: Secondary | ICD-10-CM

## 2023-04-22 DIAGNOSIS — Z87440 Personal history of urinary (tract) infections: Secondary | ICD-10-CM

## 2023-04-22 DIAGNOSIS — N390 Urinary tract infection, site not specified: Secondary | ICD-10-CM

## 2023-04-22 NOTE — Patient Instructions (Signed)
CONTINUE topical vaginal estrogen cream, cranberry supplements, and d-mannose supplements. Take a break from your oxybutynin to see if your nighttime urination, urgency, frequency, or leakage gets worse without it. If they do, let me know and we will put you back on it. If things are stable, then we can stop the medication permanently.

## 2023-04-22 NOTE — Progress Notes (Signed)
04/22/2023 10:32 AM   Ashlee Lopez 1934/01/18 [id number]  CC: Chief Complaint  Lopez presents with   Follow-up    One Year Follow up   HPI: Ashlee Lopez is a 89 y.o. female with PMH OAB wet who failed Vesicare, Gemtesa, and PTNS, now on oxybutynin XL 10 mg; nocturia previously recommended to undergo sleep study; GSM on topical vaginal estrogen cream; IBS; and recurrent UTIs on cranberry and d-mannose supplements and previously on suppressive antibiotics who presents today for annual follow-up.   Today she reports she remains on oxybutynin XL 10 mg but is unsure if it is helping.  She continues to experience nocturia x 3-6.  She has not completed a sleep study.  She is consistently using topical vaginal estrogen cream and taking cranberry and d-mannose supplements.  She has had 1 UTI in the past year.  PMH: Past Medical History:  Diagnosis Date   Benign essential HTN 03/21/2011   Breast cancer, stage 1 (HCC) 02/10/2003   Right tubular breast cancer   Cancer (HCC)    Diverticulosis of colon 03/23/2011   Family history of breast cancer    Family history of colon cancer    Family history of melanoma    Family history of prostate cancer    Fibrocystic disease of breast 03/23/2011   Graves' disease with exophthalmos 03/23/2011   Hypertension    Hypothyroid 03/21/2011   IBS (irritable bowel syndrome) 03/23/2011   ITP (idiopathic thrombocytopenic purpura) 03/21/2011   Nasal fracture 01/2020   S/P splenectomy 03/21/2011   Uterus cancer (HCC) 03/23/1999   Well differentiated AdenoCA of endometrium-superficially confined   Varicose vein of leg 03/23/2011    Surgical History: Past Surgical History:  Procedure Laterality Date   ABDOMINAL HYSTERECTOMY  2001   APPENDECTOMY  1941   BREAST SURGERY  02/17/2003   Mastectomy-Right   MASTECTOMY  2004   Dr Ashlee Lopez   OOPHORECTOMY     BSO   SKIN CANCER EXCISION  2019   removal of cancer from ear   SPLENECTOMY  1986    Home Medications:   Allergies as of 04/22/2023       Reactions   Sulfamethoxazole-trimethoprim Hives   Septra [bactrim]         Medication List        Accurate as of April 22, 2023 10:32 AM. If you have any questions, ask your nurse or doctor.          amLODipine 10 MG tablet Commonly known as: NORVASC TAKE ONE TABLET BY MOUTH DAILY FOR BLOOD PRESSURE   aspirin 81 MG tablet Take 81 mg by mouth daily.   CRANBERRY PO Take 3 tablets by mouth daily.   RA Cranberry 500 MG Caps Generic drug: Cranberry SMARTSIG:1 By Mouth   D-MANNOSE PO Take by mouth.   estradiol 0.1 MG/GM vaginal cream Commonly known as: ESTRACE VAGINAL Apply 0.5mg  (pea-sized amount)  just inside the vaginal introitus with a finger-tip on Monday, Wednesday and Friday nights.   levothyroxine 100 MCG tablet Commonly known as: SYNTHROID TAKE ONE TABLET BY MOUTH ONCE DAILY. TAKE ON AN EMPTY STOMACH WITH A GLASS OF WATER ATLEAST 30-60 MINUTES BEFORE BREAKFAST   losartan 100 MG tablet Commonly known as: COZAAR TAKE ONE TABLET BY MOUTH ONCE A DAY FOR HIGH BLOOD PRESSURE.   metoprolol tartrate 50 MG tablet Commonly known as: LOPRESSOR TAKE ONE TABLET BY MOUTH TWICE A DAY FOR BLOOD PRESSURE.   omeprazole 20 MG capsule Commonly known as: PRILOSEC  TAKE 1 CAPSULE BY MOUTH ONCE DAILY FOR HEARTBURN   oxybutynin 10 MG 24 hr tablet Commonly known as: DITROPAN-XL Take 1 tablet (10 mg total) by mouth at bedtime.   rosuvastatin 5 MG tablet Commonly known as: CRESTOR TAKE ONE TABLET (5 MG TOTAL) BY MOUTH EVERY OTHER DAY. FOR CHOLESTEROL.   TYLENOL 500 MG tablet Generic drug: acetaminophen SMARTSIG:1 By Mouth   TYLENOL PM EXTRA STRENGTH PO Take by mouth.   venlafaxine XR 75 MG 24 hr capsule Commonly known as: EFFEXOR-XR TAKE ONE CAPSULE BY MOUTH ONCE DAILY WITH BREAKFAST FOR ANXIETY   Vitamin D-3 25 MCG (1000 UT) Caps Take by mouth.   Zinc 50 MG Tabs Take by mouth.        Allergies:  Allergies  Allergen  Reactions   Sulfamethoxazole-Trimethoprim Hives   Septra [Bactrim]     Family History: Family History  Problem Relation Age of Onset   Breast cancer Mother        Age 80   Hypertension Father    Heart disease Father    Lung cancer Father    Breast cancer Sister        Age 51   Melanoma Sister        dx in her 27s   Dementia Sister    Heart disease Maternal Aunt    Prostate cancer Maternal Uncle    Colon cancer Maternal Grandfather    Colon cancer Maternal Uncle    Prostate cancer Maternal Uncle    Dementia Maternal Aunt    Breast cancer Cousin        maternal 2nd cousin   Breast cancer Cousin        maternal first cousin dx in her 53s   Melanoma Son        dx in his 86s    Social History:   reports that she has never smoked. She has never been exposed to tobacco smoke. She has never used smokeless tobacco. She reports that she does not drink alcohol and does not use drugs.  Physical Exam: BP (!) 148/81   Pulse 75   Ht 5\' 4"  (1.626 m)   Wt 152 lb (68.9 kg)   BMI 26.09 kg/m   Constitutional:  Alert and oriented, no acute distress, nontoxic appearing HEENT: [place], AT Cardiovascular: No clubbing, cyanosis, or edema Respiratory: Normal respiratory effort, no increased work of breathing Skin: No rashes, bruises or suspicious lesions Neurologic: Grossly intact, no focal deficits, moving all 4 extremities Psychiatric: Normal mood and affect  Assessment & Plan:   1. Overactive bladder (Primary) Unclear benefit on oxybutynin XL.  I suggested a drug holiday and she would like to try this.  Okay to stop oxybutynin at her convenience and if her symptoms noticeably worsen she will call me so I can refill it for her.  Otherwise, we will stay off it.  2. Nocturia Related to #1 above, though I continue to feel she would benefit from a sleep study.  3. Recurrent UTI Only 1 infection in the past year.  Continue topical vaginal estrogen cream, cranberry supplements, and d-mannose  supplements.  Return in about 1 year (around 04/21/2024) for Annual follow-up.  Ashlee Ching, PA-C  [facility] [place] [address] [place], [place] [address] [phone]

## 2023-04-22 NOTE — Telephone Encounter (Signed)
Patient lvm requesting someone call her to give her blood pressure reading from her appointment today.She said that she keeps track of it, and didn't get it before she left office.

## 2023-04-24 NOTE — Telephone Encounter (Signed)
 Patient is needing a call back from the office someone called patient she is not sure what's in re guards to she would like for a detailed voicemail to be left if she does not answer

## 2023-04-24 NOTE — Telephone Encounter (Signed)
Pt informed

## 2023-04-24 NOTE — Telephone Encounter (Signed)
 Staff message was received regarding this patients lab orders, called to clarify what patient was wanting done. She is requesting same labs be redrawn from October 2024. Staff message sent back to [facility]

## 2023-04-25 ENCOUNTER — Other Ambulatory Visit: Payer: Self-pay | Admitting: Primary Care

## 2023-04-25 DIAGNOSIS — E785 Hyperlipidemia, unspecified: Secondary | ICD-10-CM

## 2023-04-25 DIAGNOSIS — I10 Essential (primary) hypertension: Secondary | ICD-10-CM

## 2023-04-25 DIAGNOSIS — E039 Hypothyroidism, unspecified: Secondary | ICD-10-CM

## 2023-04-25 DIAGNOSIS — D693 Immune thrombocytopenic purpura: Secondary | ICD-10-CM

## 2023-05-01 ENCOUNTER — Ambulatory Visit: Payer: Medicare Other | Admitting: Podiatry

## 2023-05-08 ENCOUNTER — Other Ambulatory Visit: Payer: Medicare Other

## 2023-05-29 ENCOUNTER — Encounter: Payer: Self-pay | Admitting: Podiatry

## 2023-05-29 ENCOUNTER — Ambulatory Visit (INDEPENDENT_AMBULATORY_CARE_PROVIDER_SITE_OTHER): Payer: Medicare Other | Admitting: Podiatry

## 2023-05-29 DIAGNOSIS — B351 Tinea unguium: Secondary | ICD-10-CM | POA: Diagnosis not present

## 2023-05-29 DIAGNOSIS — M79676 Pain in unspecified toe(s): Secondary | ICD-10-CM

## 2023-05-29 NOTE — Progress Notes (Signed)
 She presents today chief complaint of painful elongated toenails.  Objective: Toenails are long thick yellow dystrophic like mycotic pulses are palpable.  No open lesions or wounds are noted.  Joints distal to the ankle full range of motion without crepitation though she does have some osteoarthritic changes of the midfoot and toes.  Assessment: Pain in limb secondary to onychomycosis.  Plan: Debridement of toenails 1 through 5 bilateral.

## 2023-06-12 ENCOUNTER — Other Ambulatory Visit (INDEPENDENT_AMBULATORY_CARE_PROVIDER_SITE_OTHER)

## 2023-06-12 DIAGNOSIS — E785 Hyperlipidemia, unspecified: Secondary | ICD-10-CM | POA: Diagnosis not present

## 2023-06-12 DIAGNOSIS — D693 Immune thrombocytopenic purpura: Secondary | ICD-10-CM

## 2023-06-12 DIAGNOSIS — E039 Hypothyroidism, unspecified: Secondary | ICD-10-CM | POA: Diagnosis not present

## 2023-06-12 DIAGNOSIS — I10 Essential (primary) hypertension: Secondary | ICD-10-CM | POA: Diagnosis not present

## 2023-06-12 NOTE — Addendum Note (Signed)
 Addended by: Lovena Neighbours on: 06/12/2023 09:26 AM   Modules accepted: Orders

## 2023-06-13 ENCOUNTER — Other Ambulatory Visit: Payer: Self-pay | Admitting: Primary Care

## 2023-06-13 DIAGNOSIS — E039 Hypothyroidism, unspecified: Secondary | ICD-10-CM

## 2023-06-13 LAB — LIPID PANEL
Chol/HDL Ratio: 2.4 ratio (ref 0.0–4.4)
Cholesterol, Total: 148 mg/dL (ref 100–199)
HDL: 61 mg/dL (ref >39)
LDL Chol Calc (NIH): 73 mg/dL (ref 0–99)
Triglycerides: 70 mg/dL (ref 0–149)
VLDL Cholesterol Cal: 14 mg/dL (ref 5–40)

## 2023-06-13 LAB — COMPREHENSIVE METABOLIC PANEL WITH GFR
ALT: 22 IU/L (ref 0–32)
AST: 30 IU/L (ref 0–40)
Albumin: 4.4 g/dL (ref 3.7–4.7)
Alkaline Phosphatase: 91 IU/L (ref 44–121)
BUN/Creatinine Ratio: 30 — ABNORMAL HIGH (ref 12–28)
BUN: 24 mg/dL (ref 8–27)
Bilirubin Total: 0.5 mg/dL (ref 0.0–1.2)
CO2: 22 mmol/L (ref 20–29)
Calcium: 9.5 mg/dL (ref 8.7–10.3)
Chloride: 102 mmol/L (ref 96–106)
Creatinine, Ser: 0.8 mg/dL (ref 0.57–1.00)
Globulin, Total: 2.9 g/dL (ref 1.5–4.5)
Glucose: 95 mg/dL (ref 70–99)
Potassium: 3.8 mmol/L (ref 3.5–5.2)
Sodium: 139 mmol/L (ref 134–144)
Total Protein: 7.3 g/dL (ref 6.0–8.5)
eGFR: 71 mL/min/{1.73_m2} (ref >59)

## 2023-06-13 LAB — CBC
Hematocrit: 37.7 % (ref 34.0–46.6)
Hemoglobin: 12.7 g/dL (ref 11.1–15.9)
MCH: 31.7 pg (ref 26.6–33.0)
MCHC: 33.7 g/dL (ref 31.5–35.7)
MCV: 94 fL (ref 79–97)
Platelets: 315 10*3/uL (ref 150–450)
RBC: 4.01 x10E6/uL (ref 3.77–5.28)
RDW: 12.7 % (ref 11.7–15.4)
WBC: 7.1 10*3/uL (ref 3.4–10.8)

## 2023-06-13 LAB — TSH: TSH: 6.66 u[IU]/mL — ABNORMAL HIGH (ref 0.450–4.500)

## 2023-06-14 ENCOUNTER — Telehealth: Payer: Self-pay

## 2023-06-14 NOTE — Telephone Encounter (Signed)
 Spoke with pt. She is aware of Kate's response. Nothing further was needed at this time.

## 2023-06-14 NOTE — Telephone Encounter (Signed)
 I do recommend a bone density scan if she is willing to have that done.  She does not have to.

## 2023-06-14 NOTE — Telephone Encounter (Signed)
 Copied from CRM [id number]. Topic: Clinical - Lab/Test Results >> Jun 14, 2023 10:51 AM Alcus Dad wrote: Reason for CRM: Patient would like lab results sent in mail and also wants to know if the bone density test necessary

## 2023-06-14 NOTE — Telephone Encounter (Signed)
Copy of labs placed in outgoing mail

## 2023-07-17 ENCOUNTER — Other Ambulatory Visit (INDEPENDENT_AMBULATORY_CARE_PROVIDER_SITE_OTHER)

## 2023-07-17 DIAGNOSIS — E039 Hypothyroidism, unspecified: Secondary | ICD-10-CM | POA: Diagnosis not present

## 2023-07-18 ENCOUNTER — Other Ambulatory Visit: Payer: Self-pay | Admitting: Primary Care

## 2023-07-18 DIAGNOSIS — E039 Hypothyroidism, unspecified: Secondary | ICD-10-CM

## 2023-07-18 LAB — TSH: TSH: 6.76 u[IU]/mL — ABNORMAL HIGH (ref 0.450–4.500)

## 2023-07-18 MED ORDER — LEVOTHYROXINE SODIUM 112 MCG PO TABS: ORAL_TABLET | ORAL | 0 refills | Status: DC

## 2023-08-14 ENCOUNTER — Ambulatory Visit (INDEPENDENT_AMBULATORY_CARE_PROVIDER_SITE_OTHER): Payer: Medicare Other

## 2023-08-14 VITALS — Ht 64.0 in | Wt 152.0 lb

## 2023-08-14 DIAGNOSIS — Z0000 Encounter for general adult medical examination without abnormal findings: Secondary | ICD-10-CM | POA: Diagnosis not present

## 2023-08-14 NOTE — Patient Instructions (Signed)
 [name] , Thank you for taking time out of your busy schedule to complete your Annual Wellness Visit with me. I enjoyed our conversation and look forward to speaking with you again next year. I, as well as your care team,  appreciate your ongoing commitment to your health goals. Please review the following plan we discussed and let me know if I can assist you in the future. Your Game plan/ To Do List     Follow up Visits: Next Medicare AWV with our clinical staff: 08/18/24 @ 10:10am televisit   Have you seen your provider in the last 6 months (3 months if uncontrolled diabetes)? No Next Office Visit with your provider: to be scheduled by pt's daughter  Clinician Recommendations:  Aim for 30 minutes of exercise or brisk walking, 6-8 glasses of water, and 5 servings of fruits and vegetables each day.       This is a list of the screening recommended for you and due dates:  Health Maintenance  Topic Date Due   Zoster (Shingles) Vaccine (1 of 2) Never done   COVID-19 Vaccine (4 - 2024-25 season) 11/18/2022   Flu Shot  10/18/2023   Medicare Annual Wellness Visit  08/13/2024   DTaP/Tdap/Td vaccine (2 - Td or Tdap) 02/04/2030   Pneumonia Vaccine  Completed   DEXA scan (bone density measurement)  Completed   HPV Vaccine  Aged Out   Meningitis B Vaccine  Aged Out    Advanced directives: (Copy Requested) Please bring a copy of your health care power of attorney and living will to the office to be added to your chart at your convenience. You can mail to [facility] [address] [place], [place] [address] or email to [email or web address] Advance Care Planning is important because it:  [x]  Makes sure you receive the medical care that is consistent with your values, goals, and preferences  [x]  It provides guidance to your family and loved ones and reduces their decisional burden about whether or not they are making the right decisions based on your wishes.  Follow the  link provided in your after visit summary or read over the paperwork we have mailed to you to help you started getting your Advance Directives in place. If you need assistance in completing these, please reach out to us  so that we can help you!

## 2023-08-14 NOTE — Progress Notes (Signed)
 Subjective:   Ashlee Lopez is a 89 y.o. who presents for a Medicare Wellness preventive visit.  As a reminder, Annual Wellness Visits don't include a physical exam, and some assessments may be limited, especially if this visit is performed virtually. We may recommend an in-person follow-up visit with your provider if needed.  Visit Complete: Virtual I connected with  Ashlee Lopez on 08/14/23 by a audio enabled telemedicine application and verified that I am speaking with the correct person using two identifiers.  Patient Location: Home  Provider Location: Home Office  I discussed the limitations of evaluation and management by telemedicine. The patient expressed understanding and agreed to proceed.  Vital Signs: Because this visit was a virtual/telehealth visit, some criteria may be missing or patient reported. Any vitals not documented were not able to be obtained and vitals that have been documented are patient reported.  VideoDeclined- This patient declined Librarian, academic. Therefore the visit was completed with audio only.  Persons Participating in Visit: Patient.  AWV Questionnaire: No: Patient Medicare AWV questionnaire was not completed prior to this visit.  Cardiac Risk Factors include: advanced age (>19men, >10 women);dyslipidemia;hypertension;sedentary lifestyle     Objective:     Today's Vitals   08/14/23 1014  Weight: 152 lb (68.9 kg)  Height: 5\' 4"  (1.626 m)   Body mass index is 26.09 kg/m.     08/14/2023   10:35 AM 08/09/2022   12:14 PM 07/25/2021   11:03 AM 02/02/2020    9:19 PM 12/16/2019    2:08 PM 12/04/2017    3:49 PM 11/21/2016    8:40 AM  Advanced Directives  Does Patient Have a Medical Advance Directive? Yes Yes No No Yes Yes Yes  Type of Estate agent of [place];Living will Healthcare Power of [place];Living will   Healthcare Power of [place];Living will Healthcare Power of [place];Living will  Healthcare Power of [place];Living will  Does patient want to make changes to medical advance directive?      No - Patient declined   Copy of Healthcare Power of Attorney in Chart? No - copy requested No - copy requested   No - copy requested No - copy requested No - copy requested  Would patient like information on creating a medical advance directive?   No - Patient declined No - Patient declined       Current Medications (verified) Outpatient Encounter Medications as of 08/14/2023  Medication Sig   amLODipine  (NORVASC ) 10 MG tablet TAKE ONE TABLET BY MOUTH DAILY FOR BLOOD PRESSURE   aspirin 81 MG tablet Take 81 mg by mouth daily.   Cholecalciferol (VITAMIN D -3) 25 MCG (1000 UT) CAPS Take by mouth.   CRANBERRY PO Take 3 tablets by mouth daily.   D-MANNOSE PO Take by mouth.   Diphenhydramine-APAP, sleep, (TYLENOL PM EXTRA STRENGTH PO) Take by mouth.   estradiol  (ESTRACE  VAGINAL) 0.1 MG/GM vaginal cream Apply 0.5mg  (pea-sized amount)  just inside the vaginal introitus with a finger-tip on Monday, Wednesday and Friday nights.   levothyroxine  (SYNTHROID ) 112 MCG tablet Take 1 tablet by mouth every morning on an empty stomach with water only.  No food or other medications for 30 minutes.   losartan  (COZAAR ) 100 MG tablet TAKE ONE TABLET BY MOUTH ONCE A DAY FOR HIGH BLOOD PRESSURE.   metoprolol  tartrate (LOPRESSOR ) 50 MG tablet TAKE ONE TABLET BY MOUTH TWICE A DAY FOR BLOOD PRESSURE.   omeprazole  (PRILOSEC) 20 MG capsule TAKE 1 CAPSULE BY  MOUTH ONCE DAILY FOR HEARTBURN   oxybutynin  (DITROPAN -XL) 10 MG 24 hr tablet Take 1 tablet (10 mg total) by mouth at bedtime.   RA CRANBERRY 500 MG CAPS SMARTSIG:1 By Mouth   rosuvastatin  (CRESTOR ) 5 MG tablet TAKE ONE TABLET (5 MG TOTAL) BY MOUTH EVERY OTHER DAY. FOR CHOLESTEROL.   TYLENOL 500 MG tablet SMARTSIG:1 By Mouth   venlafaxine  XR (EFFEXOR -XR) 75 MG 24 hr capsule TAKE ONE CAPSULE BY MOUTH ONCE DAILY WITH BREAKFAST FOR ANXIETY   Zinc 50 MG TABS Take by  mouth.   No facility-administered encounter medications on file as of 08/14/2023.    Allergies (verified) Sulfamethoxazole-trimethoprim and Septra [bactrim]   History: Past Medical History:  Diagnosis Date   Benign essential HTN 03/21/2011   Breast cancer, stage 1 (HCC) 02/10/2003   Right tubular breast cancer   Cancer (HCC)    Diverticulosis of colon 03/23/2011   Family history of breast cancer    Family history of colon cancer    Family history of melanoma    Family history of prostate cancer    Fibrocystic disease of breast 03/23/2011   Graves' disease with exophthalmos 03/23/2011   Hypertension    Hypothyroid 03/21/2011   IBS (irritable bowel syndrome) 03/23/2011   ITP (idiopathic thrombocytopenic purpura) 03/21/2011   Nasal fracture 01/2020   S/P splenectomy 03/21/2011   Uterus cancer (HCC) 03/23/1999   Well differentiated AdenoCA of endometrium-superficially confined   Varicose vein of leg 03/23/2011   Past Surgical History:  Procedure Laterality Date   ABDOMINAL HYSTERECTOMY  2001   APPENDECTOMY  1941   BREAST SURGERY  02/17/2003   Mastectomy-Right   MASTECTOMY  2004   Dr Linell Rhymes   OOPHORECTOMY     BSO   SKIN CANCER EXCISION  2019   removal of cancer from ear   SPLENECTOMY  1986   Family History  Problem Relation Age of Onset   Breast cancer Mother        Age 14   Hypertension Father    Heart disease Father    Lung cancer Father    Breast cancer Sister        Age 25   Melanoma Sister        dx in her 28s   Dementia Sister    Heart disease Maternal Aunt    Prostate cancer Maternal Uncle    Colon cancer Maternal Grandfather    Colon cancer Maternal Uncle    Prostate cancer Maternal Uncle    Dementia Maternal Aunt    Breast cancer Cousin        maternal 2nd cousin   Breast cancer Cousin        maternal first cousin dx in her 67s   Melanoma Son        dx in his 48s   Social History   Socioeconomic History   Marital status: Widowed    Spouse name: Not on file    Number of children: Not on file   Years of education: Not on file   Highest education level: Not on file  Occupational History   Not on file  Tobacco Use   Smoking status: Never    Passive exposure: Never   Smokeless tobacco: Never  Vaping Use   Vaping status: Never Used  Substance and Sexual Activity   Alcohol use: No   Drug use: No   Sexual activity: Never    Birth control/protection: Surgical  Other Topics Concern   Not on file  Social History Narrative   Widow. Lives alone.    3 children, 5 grandchildren.   Retired. Once worked in Community education officer.   Enjoys reading, watching TV.   Social Drivers of Corporate investment banker Strain: Low Risk  (08/14/2023)   Overall Financial Resource Strain (CARDIA)    Difficulty of Paying Living Expenses: Not hard at all  Food Insecurity: No Food Insecurity (08/14/2023)   Hunger Vital Sign    Worried About Running Out of Food in the Last Year: Never true    Ran Out of Food in the Last Year: Never true  Transportation Needs: No Transportation Needs (08/14/2023)   PRAPARE - Administrator, Civil Service (Medical): No    Lack of Transportation (Non-Medical): No  Physical Activity: Inactive (08/14/2023)   Exercise Vital Sign    Days of Exercise per Week: 0 days    Minutes of Exercise per Session: 0 min  Stress: No Stress Concern Present (08/14/2023)   [employer] of Occupational Health - Occupational Stress Questionnaire    Feeling of Stress : Not at all  Social Connections: Moderately Isolated (08/14/2023)   Social Connection and Isolation Panel [NHANES]    Frequency of Communication with Friends and Family: More than three times a week    Frequency of Social Gatherings with Friends and Family: More than three times a week    Attends Religious Services: More than 4 times per year    Active Member of [employer] or Organizations: No    Attends Banker Meetings: Never    Marital Status: Widowed    Tobacco  Counseling Counseling given: Not Answered    Clinical Intake:  Pre-visit preparation completed: Yes  Pain : No/denies pain     BMI - recorded: 26.09 Nutritional Status: BMI 25 -29 Overweight Nutritional Risks: None Diabetes: No  Lab Results  Component Value Date   HGBA1C 5.9 (H) 12/16/2019   HGBA1C 5.7 (A) 12/17/2018   HGBA1C 5.6 12/04/2017     How often do you need to have someone help you when you read instructions, pamphlets, or other written materials from your doctor or pharmacy?: 1 - Never  Interpreter Needed?: No  Comments: lives alone Information entered by :: B.Sontee Desena,LPN   Activities of Daily Living     08/14/2023   10:35 AM  In your present state of health, do you have any difficulty performing the following activities:  Hearing? 0  Vision? 0  Difficulty concentrating or making decisions? 0  Walking or climbing stairs? 0  Dressing or bathing? 0  Doing errands, shopping? 1  Preparing Food and eating ? N  Using the Toilet? N  In the past six months, have you accidently leaked urine? Y  Do you have problems with loss of bowel control? N  Managing your Medications? N  Managing your Finances? N  Housekeeping or managing your Housekeeping? Y    Patient Care Team: Gabriel John, NP as PCP - General (Internal Medicine) Scotty Cyphers, MD (Hematology and Oncology) Abel Abelson, OD as Referring Physician (Optometry) Gregory Leash, MD as Consulting Physician (Dermatology) Forest Idol, MD as Attending Physician (Radiology) [facility], [facility]  Indicate any recent Medical Services you may have received from other than [facility] providers in the past year (date may be approximate).     Assessment:    This is a routine wellness examination for [place].  Hearing/Vision screen Hearing Screening - Comments:: Pt says her hearing is good with  hearing aids Vision Screening - Comments:: Pt says her vision is good w/glasses Dr Abel Abelson   Goals Addressed             This Visit's Progress    DIET - EAT MORE FRUITS AND VEGETABLES       08/14/23     Increase water intake   On track    Starting 12/04/2017, I will continue to drink at least 6-8 glasses of water daily.      Patient Stated   On track    08/14/23-,  I will maintain and continue medications as prescribed.      Patient Stated       I would love to be able to walk better       Depression Screen     08/14/2023   10:30 AM 08/09/2022   12:13 PM 07/25/2021   11:02 AM 12/16/2019    2:12 PM 12/17/2018    9:16 AM 12/04/2017    8:42 AM 11/21/2016    8:20 AM  PHQ 2/9 Scores  PHQ - 2 Score 1 0 0 0 1 0 2  PHQ- 9 Score    0  0 4    Fall Risk     08/14/2023   10:20 AM 01/02/2023   10:48 AM 08/09/2022   12:15 PM 07/25/2021   11:04 AM 02/05/2020   10:46 AM  Fall Risk   Falls in the past year? 1 1 1 1 1   Comment happens when bending over to get some      Number falls in past yr: 1 1 1  0 0  Injury with Fall? 0 1 0 0 1  Risk for fall due to : Impaired balance/gait;Impaired mobility;History of fall(s) History of fall(s);Impaired balance/gait Impaired balance/gait;Other (Comment) History of fall(s) Impaired balance/gait;History of fall(s)  Risk for fall due to: Comment   tripped at church, and tripped over blanket at home    Follow up Education provided;Falls prevention discussed Falls evaluation completed Falls evaluation completed;Falls prevention discussed;Education provided Falls prevention discussed     MEDICARE RISK AT HOME:  Medicare Risk at Home Any stairs in or around the home?: No If so, are there any without handrails?: No Home free of loose throw rugs in walkways, pet beds, electrical cords, etc?: Yes Adequate lighting in your home to reduce risk of falls?: Yes Life alert?: Yes Use of a cane, walker or w/c?: Yes Grab bars in the bathroom?: Yes Shower chair or bench in shower?: Yes Elevated toilet seat or a handicapped toilet?: Yes  TIMED UP  AND GO:  Was the test performed?  No  Cognitive Function: 6CIT completed    12/16/2019    2:16 PM 12/04/2017    3:48 PM 11/21/2016    8:15 AM 09/28/2015   10:19 AM  MMSE - Mini Mental State Exam  Orientation to time 5 5 5 5   Orientation to Place 5 5 5 5   Registration 3 3 3 3   Attention/ Calculation 5 0 0 0  Recall 3 3 3 3   Language- name 2 objects  0 0 0  Language- repeat 1 1 1 1   Language- follow 3 step command  3 3 3   Language- read & follow direction  0 0 0  Write a sentence  0 0 0  Copy design  0 0 0  Total score  20 20 20         08/14/2023   10:37 AM 08/09/2022   12:17 PM  6CIT Screen  What Year? 0 points 0 points  What month? 0 points 0 points  What time? 0 points 0 points  Count back from 20 0 points 0 points  Months in reverse 0 points 0 points  Repeat phrase 2 points 2 points  Total Score 2 points 2 points    Immunizations Immunization History  Administered Date(s) Administered   Fluad Quad(high Dose 65+) 12/17/2018, 12/23/2019, 12/21/2020, 01/02/2022   Fluad Trivalent(High Dose 65+) 01/02/2023   Influenza,inj,Quad PF,6+ Mos 12/05/2016, 12/04/2017   Influenza-Unspecified 12/19/2015   PFIZER(Purple Top)SARS-COV-2 Vaccination 04/03/2019, 04/24/2019, 02/17/2020   PNEUMOCOCCAL CONJUGATE-20 01/02/2023   Pneumococcal Conjugate-13 09/28/2015   Pneumococcal Polysaccharide-23 03/23/2011   Tdap 02/05/2020    Screening Tests Health Maintenance  Topic Date Due   Zoster Vaccines- Shingrix (1 of 2) Never done   COVID-19 Vaccine (4 - 2024-25 season) 11/18/2022   INFLUENZA VACCINE  10/18/2023   Medicare Annual Wellness (AWV)  08/13/2024   DTaP/Tdap/Td (2 - Td or Tdap) 02/04/2030   Pneumonia Vaccine 38+ Years old  Completed   DEXA SCAN  Completed   HPV VACCINES  Aged Out   Meningococcal B Vaccine  Aged Out    Health Maintenance  Health Maintenance Due  Topic Date Due   Zoster Vaccines- Shingrix (1 of 2) Never done   COVID-19 Vaccine (4 - 2024-25 season)  11/18/2022   Health Maintenance Items Addressed: None needed at this time Shingrix Vaccine: Patient declined vaccine.  Due, Education has been provided regarding the importance of this vaccine. Advised may receive this vaccine at local pharmacy or Health Dept. Aware to provide a copy of the vaccination record if obtained from local pharmacy or Health Dept. Verbalized acceptance and understanding.   Additional Screening:  Vision Screening: Recommended annual ophthalmology exams for early detection of glaucoma and other disorders of the eye.  Dental Screening: Recommended annual dental exams for proper oral hygiene  Community Resource Referral / Chronic Care Management: CRR required this visit?  No   CCM required this visit?  No pt says her daughter will call and make PE appt (PCP)   Plan:    I have personally reviewed and noted the following in the patient's chart:   Medical and social history Use of alcohol, tobacco or illicit drugs  Current medications and supplements including opioid prescriptions. Patient is not currently taking opioid prescriptions. Functional ability and status Nutritional status Physical activity Advanced directives List of other physicians Hospitalizations, surgeries, and ER visits in previous 12 months Vitals Screenings to include cognitive, depression, and falls Referrals and appointments  In addition, I have reviewed and discussed with patient certain preventive protocols, quality metrics, and best practice recommendations. A written personalized care plan for preventive services as well as general preventive health recommendations were provided to patient.   Nerissa Bannister, LPN   04/06/1476   After Visit Summary: (Declined) Due to this being a telephonic visit, with patients personalized plan was offered to patient but patient Declined AVS at this time   Notes: Nothing significant to report at this time.

## 2023-08-26 ENCOUNTER — Telehealth: Payer: Self-pay

## 2023-08-26 DIAGNOSIS — E039 Hypothyroidism, unspecified: Secondary | ICD-10-CM

## 2023-08-26 NOTE — Telephone Encounter (Signed)
 Copied from CRM [id number]. Topic: Clinical - Request for Lab/Test Order >> Aug 26, 2023  2:01 PM Alyse July wrote: Reason for CRM: Patient daughter would like orders for patient Thyroids levels to be re-check after medication adjustment. Prefer last week in June not early in the morning. [phone].

## 2023-08-26 NOTE — Telephone Encounter (Addendum)
 Yes, thyroid  levels are due for recheck after June 30th.  Please have her scheduled for lab appt.

## 2023-08-26 NOTE — Addendum Note (Signed)
 Addended by: Mccartney Chuba K on: 08/26/2023 07:27 PM   Modules accepted: Orders

## 2023-08-28 ENCOUNTER — Ambulatory Visit (INDEPENDENT_AMBULATORY_CARE_PROVIDER_SITE_OTHER): Admitting: Podiatry

## 2023-08-28 ENCOUNTER — Encounter: Payer: Self-pay | Admitting: Podiatry

## 2023-08-28 DIAGNOSIS — B351 Tinea unguium: Secondary | ICD-10-CM

## 2023-08-28 DIAGNOSIS — Z1231 Encounter for screening mammogram for malignant neoplasm of breast: Secondary | ICD-10-CM | POA: Diagnosis not present

## 2023-08-28 DIAGNOSIS — M79676 Pain in unspecified toe(s): Secondary | ICD-10-CM

## 2023-08-28 LAB — HM MAMMOGRAPHY

## 2023-08-28 NOTE — Progress Notes (Signed)
 She presents today chief complaint of painful elongated toenails.  Objective: Toenails are long thick yellow dystrophic like mycotic pulses are palpable.  No open lesions or wounds are noted.  Joints distal to the ankle full range of motion without crepitation though she does have some osteoarthritic changes of the midfoot and toes.  Assessment: Pain in limb secondary to onychomycosis.  Plan: Debridement of toenails 1 through 5 bilateral.

## 2023-08-30 ENCOUNTER — Ambulatory Visit
Admission: RE | Admit: 2023-08-30 | Discharge: 2023-08-30 | Disposition: A | Discharge status: Home / Self Care | Source: Ambulatory Visit | Attending: Emergency Medicine | Admitting: Emergency Medicine

## 2023-08-30 VITALS — BP 135/85 | HR 83 | Temp 98.5°F | Resp 18

## 2023-08-30 DIAGNOSIS — J069 Acute upper respiratory infection, unspecified: Secondary | ICD-10-CM | POA: Diagnosis not present

## 2023-08-30 DIAGNOSIS — R051 Acute cough: Secondary | ICD-10-CM | POA: Diagnosis not present

## 2023-08-30 MED ORDER — AMOXICILLIN-POT CLAVULANATE 875-125 MG PO TABS: 1.0000 | ORAL_TABLET | Freq: Two times a day (BID) | ORAL | 0 refills | Status: DC

## 2023-08-30 NOTE — ED Triage Notes (Signed)
 Patient to Urgent Care with complaints of a productive cough that started one week ago. No fevers. Reports green sputum.  Meds: delsym.

## 2023-08-30 NOTE — ED Provider Notes (Signed)
 Arlander Bellman    CSN: [id number] Arrival date & time: 08/30/23  1022      History   Chief Complaint Chief Complaint  Patient presents with   Cough    HPI Ashlee Lopez is a 89 y.o. female.  Accompanied by her daughter, patient presents with 1 week history of productive cough.  The cough started out dry and has become productive.  No fever, chills, chest pain, shortness of breath.  She has been treating the cough with Delsym.  Her medical history includes chronic cough.  The history is provided by the patient, a relative and medical records.    Past Medical History:  Diagnosis Date   Benign essential HTN 03/21/2011   Breast cancer, stage 1 (HCC) 02/10/2003   Right tubular breast cancer   Cancer (HCC)    Diverticulosis of colon 03/23/2011   Family history of breast cancer    Family history of colon cancer    Family history of melanoma    Family history of prostate cancer    Fibrocystic disease of breast 03/23/2011   Graves' disease with exophthalmos 03/23/2011   Hypertension    Hypothyroid 03/21/2011   IBS (irritable bowel syndrome) 03/23/2011   ITP (idiopathic thrombocytopenic purpura) 03/21/2011   Nasal fracture 01/2020   S/P splenectomy 03/21/2011   Uterus cancer (HCC) 03/23/1999   Well differentiated AdenoCA of endometrium-superficially confined   Varicose vein of leg 03/23/2011    Patient Active Problem List   Diagnosis Date Noted   Sensorineural hearing loss, bilateral 02/21/2023   Rash and nonspecific skin eruption 01/02/2022   Paresthesias 12/21/2020   Overactive bladder 08/24/2020   Recurrent falls 02/05/2020   Low sodium levels 02/05/2020   Limited range of motion (ROM) of shoulder 02/05/2020   Medicare annual wellness visit, subsequent 12/17/2018   Chronic cough 12/11/2017   Recurrent UTI 04/23/2016   Genetic testing 11/24/2015   Generalized anxiety disorder 05/11/2015   Hyperlipidemia 05/11/2015   Graves' disease with exophthalmos 03/23/2011   Diverticulosis  of colon 03/23/2011   IBS (irritable bowel syndrome) 03/23/2011   Varicose vein of leg 03/23/2011   Thyrotoxicosis with diffuse goiter and without thyroid  storm 03/23/2011   Idiopathic thrombocytopenic purpura (HCC) 03/21/2011   Benign essential HTN 03/21/2011   Hypothyroid 03/21/2011   History of breast cancer, invasive mammary right  12/11/2010   Uterus cancer (HCC) 03/23/1999    Past Surgical History:  Procedure Laterality Date   ABDOMINAL HYSTERECTOMY  2001   APPENDECTOMY  1941   BREAST SURGERY  02/17/2003   Mastectomy-Right   MASTECTOMY  2004   Dr Linell Rhymes   OOPHORECTOMY     BSO   SKIN CANCER EXCISION  2019   removal of cancer from ear   SPLENECTOMY  1986    OB History     Gravida  3   Para  3   Term  2   Preterm  1   AB      Living  3      SAB      IAB      Ectopic      Multiple      Live Births               Home Medications    Prior to Admission medications   Medication Sig Start Date End Date Taking? Authorizing Provider  amoxicillin -clavulanate (AUGMENTIN ) 875-125 MG tablet Take 1 tablet by mouth every 12 (twelve) hours. 08/30/23  Yes Wellington Half, NP  amLODipine  (NORVASC ) 10 MG tablet TAKE ONE TABLET BY MOUTH DAILY FOR BLOOD PRESSURE 02/12/23   Clark, Katherine K, NP  aspirin 81 MG tablet Take 81 mg by mouth daily.    [provider]  Cholecalciferol (VITAMIN D -3) 25 MCG (1000 UT) CAPS Take by mouth.    [provider]  CRANBERRY PO Take 3 tablets by mouth daily.    [provider]  D-MANNOSE PO Take by mouth.    [provider]  Diphenhydramine-APAP, sleep, (TYLENOL PM EXTRA STRENGTH PO) Take by mouth.    [provider]  estradiol  (ESTRACE  VAGINAL) 0.1 MG/GM vaginal cream Apply 0.5mg  (pea-sized amount)  just inside the vaginal introitus with a finger-tip on Monday, Wednesday and Friday nights. 07/24/22   Matilde Son A, PA-C  levothyroxine  (SYNTHROID ) 112 MCG tablet Take 1 tablet by mouth  every morning on an empty stomach with water only.  No food or other medications for 30 minutes. 07/18/23   Clark, Katherine K, NP  losartan  (COZAAR ) 100 MG tablet TAKE ONE TABLET BY MOUTH ONCE A DAY FOR HIGH BLOOD PRESSURE. 02/12/23   Clark, Katherine K, NP  metoprolol  tartrate (LOPRESSOR ) 50 MG tablet TAKE ONE TABLET BY MOUTH TWICE A DAY FOR BLOOD PRESSURE. 02/12/23   Clark, Katherine K, NP  omeprazole  (PRILOSEC) 20 MG capsule TAKE 1 CAPSULE BY MOUTH ONCE DAILY FOR HEARTBURN 11/04/19   Clark, Katherine K, NP  oxybutynin  (DITROPAN -XL) 10 MG 24 hr tablet Take 1 tablet (10 mg total) by mouth at bedtime. 04/30/22   Erman Hayward, MD  RA CRANBERRY 500 MG CAPS SMARTSIG:1 By Mouth 07/12/21   [provider]  rosuvastatin  (CRESTOR ) 5 MG tablet TAKE ONE TABLET (5 MG TOTAL) BY MOUTH EVERY OTHER DAY. FOR CHOLESTEROL. 01/16/23   Clark, Katherine K, NP  TYLENOL 500 MG tablet SMARTSIG:1 By Mouth 07/12/21   [provider]  venlafaxine  XR (EFFEXOR -XR) 75 MG 24 hr capsule TAKE ONE CAPSULE BY MOUTH ONCE DAILY WITH BREAKFAST FOR ANXIETY 01/28/23   Clark, Katherine K, NP  Zinc 50 MG TABS Take by mouth.    [provider]    Family History Family History  Problem Relation Age of Onset   Breast cancer Mother        Age 54   Hypertension Father    Heart disease Father    Lung cancer Father    Breast cancer Sister        Age 18   Melanoma Sister        dx in her 49s   Dementia Sister    Heart disease Maternal Aunt    Prostate cancer Maternal Uncle    Colon cancer Maternal Grandfather    Colon cancer Maternal Uncle    Prostate cancer Maternal Uncle    Dementia Maternal Aunt    Breast cancer Cousin        maternal 2nd cousin   Breast cancer Cousin        maternal first cousin dx in her 73s   Melanoma Son        dx in his 58s    Social History Social History   Tobacco Use   Smoking status: Never    Passive exposure: Never   Smokeless tobacco: Never  Vaping Use   Vaping  status: Never Used  Substance Use Topics   Alcohol use: No   Drug use: No     Allergies   Sulfamethoxazole-trimethoprim and Septra [bactrim]   Review of Systems Review of  Systems  Constitutional:  Negative for chills and fever.  HENT:  Negative for ear pain and sore throat.   Respiratory:  Positive for cough. Negative for shortness of breath.   Cardiovascular:  Negative for chest pain and palpitations.     Physical Exam Triage Vital Signs ED Triage Vitals [08/30/23 1035]  Encounter Vitals Group     BP 135/85     Girls Systolic BP Percentile      Girls Diastolic BP Percentile      Boys Systolic BP Percentile      Boys Diastolic BP Percentile      Pulse Rate 83     Resp 18     Temp 98.5 F (36.9 C)     Temp src      SpO2 96 %     Weight      Height      Head Circumference      Peak Flow      Pain Score      Pain Loc      Pain Education      Exclude from Growth Chart    No data found.  Updated Vital Signs BP 135/85   Pulse 83   Temp 98.5 F (36.9 C)   Resp 18   SpO2 96%   Visual Acuity Right Eye Distance:   Left Eye Distance:   Bilateral Distance:    Right Eye Near:   Left Eye Near:    Bilateral Near:     Physical Exam Constitutional:      General: She is not in acute distress. HENT:     Right Ear: Tympanic membrane normal.     Left Ear: Tympanic membrane normal.     Nose: Nose normal.     Mouth/Throat:     Mouth: Mucous membranes are moist.     Pharynx: Oropharynx is clear.   Cardiovascular:     Rate and Rhythm: Normal rate and regular rhythm.     Heart sounds: Normal heart sounds.  Pulmonary:     Effort: Pulmonary effort is normal. No respiratory distress.     Breath sounds: Normal breath sounds.   Neurological:     Mental Status: She is alert.      UC Treatments / Results  Labs (all labs ordered are listed, but only abnormal results are displayed) Labs Reviewed - No data to display  EKG   Radiology No results  found.  Procedures Procedures (including critical care time)  Medications Ordered in UC Medications - No data to display  Initial Impression / Assessment and Plan / UC Course  I have reviewed the triage vital signs and the nursing notes.  Pertinent labs & imaging results that were available during my care of the patient were reviewed by me and considered in my medical decision making (see chart for details).    Cough, acute upper respiratory infection.  Afebrile and vital signs are stable.  Patient declines chest x-ray at this time.  Treating with Augmentin .  Instructed patient to follow-up with her PCP on Monday.  ED precautions given.  Education provided on upper respiratory infection.  She agrees to plan of care.  Final Clinical Impressions(s) / UC Diagnoses   Final diagnoses:  Acute cough  Acute upper respiratory infection     Discharge Instructions      Follow up with your primary care provider on Monday.  Go to the emergency department if you have worsening symptoms.    Take the Augmentin   as directed.          ED Prescriptions     Medication Sig Dispense Auth. Provider   amoxicillin -clavulanate (AUGMENTIN ) 875-125 MG tablet Take 1 tablet by mouth every 12 (twelve) hours. 14 tablet Wellington Half, NP      PDMP not reviewed this encounter.   Wellington Half, NP 08/30/23 1058

## 2023-08-30 NOTE — Discharge Instructions (Addendum)
 Follow up with your primary care provider on Monday.  Go to the emergency department if you have worsening symptoms.    Take the Augmentin  as directed.

## 2023-09-11 ENCOUNTER — Other Ambulatory Visit

## 2023-09-11 DIAGNOSIS — E039 Hypothyroidism, unspecified: Secondary | ICD-10-CM | POA: Diagnosis not present

## 2023-09-11 NOTE — Addendum Note (Signed)
 Addended by: HOPE VEVA PARAS on: 09/11/2023 03:40 PM   Modules accepted: Orders

## 2023-09-12 ENCOUNTER — Ambulatory Visit: Payer: Self-pay | Admitting: Primary Care

## 2023-09-12 LAB — TSH: TSH: 1.8 u[IU]/mL (ref 0.450–4.500)

## 2023-09-13 ENCOUNTER — Encounter: Payer: Self-pay | Admitting: Primary Care

## 2023-09-13 ENCOUNTER — Ambulatory Visit (INDEPENDENT_AMBULATORY_CARE_PROVIDER_SITE_OTHER)
Admission: RE | Admit: 2023-09-13 | Discharge: 2023-09-13 | Disposition: A | Discharge status: Home / Self Care | Source: Ambulatory Visit | Attending: Primary Care

## 2023-09-13 ENCOUNTER — Ambulatory Visit (INDEPENDENT_AMBULATORY_CARE_PROVIDER_SITE_OTHER): Admitting: Primary Care

## 2023-09-13 ENCOUNTER — Ambulatory Visit: Payer: Self-pay | Admitting: Primary Care

## 2023-09-13 VITALS — BP 124/72 | HR 85 | Temp 97.3°F | Ht 64.0 in | Wt 149.0 lb

## 2023-09-13 DIAGNOSIS — R051 Acute cough: Secondary | ICD-10-CM

## 2023-09-13 DIAGNOSIS — R059 Cough, unspecified: Secondary | ICD-10-CM | POA: Diagnosis not present

## 2023-09-13 NOTE — Assessment & Plan Note (Signed)
 Likely viral etiology, especially given slow improvement post antibiotics. Respiratory exam today reassuring.  Will check chest x-ray today given reports from daughter. Continue Delsym/Robitussin if needed.  Await results.

## 2023-09-13 NOTE — Patient Instructions (Signed)
Complete xray(s) prior to leaving today. I will notify you of your results once received.  It was a pleasure to see you today!  

## 2023-09-13 NOTE — Progress Notes (Signed)
 Subjective:    Patient ID: Ashlee Lopez, female    DOB: Nov 25, 1933, 89 y.o.   MRN: [id number]  Cough Pertinent negatives include no chills, fever, headaches, postnasal drip, rhinorrhea or shortness of breath.    Ashlee Lopez is a very pleasant 89 y.o. female with a history of hypertension, hypothyroidism, uterine cancer, recurrent UTI, breast cancer, chronic cough who presents today to discuss cough.  Her daughter joins us  today.  Evaluated in urgent care on 08/30/2023 for a 1 week history of productive cough.  She was treated with Augmentin  875-125 mg twice daily x 7 days.  She declined chest x-ray at the time.  Today she continues to experience a congested cough which has improved in terms of frequency and intensity. She's feeling better overall. She denies fevers, chills, nausea, headaches. She completed her course of antibiotics last week.  She has taken Delsym and Robitussin over-the-Lopez intermittently.  Her daughter is nervous about her cough as it is deep and congested sounding.  Review of Systems  Constitutional:  Negative for chills, fatigue and fever.  HENT:  Positive for congestion. Negative for postnasal drip, rhinorrhea and sinus pressure.   Respiratory:  Positive for cough. Negative for chest tightness and shortness of breath.   Neurological:  Negative for headaches.         Past Medical History:  Diagnosis Date   Benign essential HTN 03/21/2011   Breast cancer, stage 1 (HCC) 02/10/2003   Right tubular breast cancer   Cancer (HCC)    Diverticulosis of colon 03/23/2011   Family history of breast cancer    Family history of colon cancer    Family history of melanoma    Family history of prostate cancer    Fibrocystic disease of breast 03/23/2011   Graves' disease with exophthalmos 03/23/2011   Hypertension    Hypothyroid 03/21/2011   IBS (irritable bowel syndrome) 03/23/2011   ITP (idiopathic thrombocytopenic purpura) 03/21/2011   Nasal fracture 01/2020   S/P  splenectomy 03/21/2011   Uterus cancer (HCC) 03/23/1999   Well differentiated AdenoCA of endometrium-superficially confined   Varicose vein of leg 03/23/2011    Social History   Socioeconomic History   Marital status: Widowed    Spouse name: Not on file   Number of children: Not on file   Years of education: Not on file   Highest education level: Not on file  Occupational History   Not on file  Tobacco Use   Smoking status: Never    Passive exposure: Never   Smokeless tobacco: Never  Vaping Use   Vaping status: Never Used  Substance and Sexual Activity   Alcohol use: No   Drug use: No   Sexual activity: Never    Birth control/protection: Surgical  Other Topics Concern   Not on file  Social History Narrative   Widow. Lives alone.    3 children, 5 grandchildren.   Retired. Once worked in Community education officer.   Enjoys reading, watching TV.   Social Drivers of Corporate investment banker Strain: Low Risk  (08/14/2023)   Overall Financial Resource Strain (CARDIA)    Difficulty of Paying Living Expenses: Not hard at all  Food Insecurity: No Food Insecurity (08/14/2023)   Hunger Vital Sign    Worried About Running Out of Food in the Last Year: Never true    Ran Out of Food in the Last Year: Never true  Transportation Needs: No Transportation Needs (08/14/2023)   PRAPARE - Transportation  Lack of Transportation (Medical): No    Lack of Transportation (Non-Medical): No  Physical Activity: Inactive (08/14/2023)   Exercise Vital Sign    Days of Exercise per Week: 0 days    Minutes of Exercise per Session: 0 min  Stress: No Stress Concern Present (08/14/2023)   [employer] of Occupational Health - Occupational Stress Questionnaire    Feeling of Stress : Not at all  Social Connections: Moderately Isolated (08/14/2023)   Social Connection and Isolation Panel    Frequency of Communication with Friends and Family: More than three times a week    Frequency of Social Gatherings with Friends  and Family: More than three times a week    Attends Religious Services: More than 4 times per year    Active Member of [employer] or Organizations: No    Attends Banker Meetings: Never    Marital Status: Widowed  Intimate Partner Violence: Not At Risk (08/14/2023)   Humiliation, Afraid, Rape, and Kick questionnaire    Fear of Current or Ex-Partner: No    Emotionally Abused: No    Physically Abused: No    Sexually Abused: No    Past Surgical History:  Procedure Laterality Date   ABDOMINAL HYSTERECTOMY  2001   APPENDECTOMY  1941   BREAST SURGERY  02/17/2003   Mastectomy-Right   MASTECTOMY  2004   Dr Merrilyn   OOPHORECTOMY     BSO   SKIN CANCER EXCISION  2019   removal of cancer from ear   SPLENECTOMY  1986    Family History  Problem Relation Age of Onset   Breast cancer Mother        Age 68   Hypertension Father    Heart disease Father    Lung cancer Father    Breast cancer Sister        Age 56   Melanoma Sister        dx in her 74s   Dementia Sister    Heart disease Maternal Aunt    Prostate cancer Maternal Uncle    Colon cancer Maternal Grandfather    Colon cancer Maternal Uncle    Prostate cancer Maternal Uncle    Dementia Maternal Aunt    Breast cancer Cousin        maternal 2nd cousin   Breast cancer Cousin        maternal first cousin dx in her 79s   Melanoma Son        dx in his 68s    Allergies  Allergen Reactions   Sulfamethoxazole-Trimethoprim Hives   Septra [Bactrim]     Current Outpatient Medications on File Prior to Visit  Medication Sig Dispense Refill   amLODipine  (NORVASC ) 10 MG tablet TAKE ONE TABLET BY MOUTH DAILY FOR BLOOD PRESSURE 90 tablet 2   amoxicillin -clavulanate (AUGMENTIN ) 875-125 MG tablet Take 1 tablet by mouth every 12 (twelve) hours. 14 tablet 0   aspirin 81 MG tablet Take 81 mg by mouth daily.     Cholecalciferol (VITAMIN D -3) 25 MCG (1000 UT) CAPS Take by mouth.     CRANBERRY PO Take 3 tablets by mouth daily.      D-MANNOSE PO Take by mouth.     Diphenhydramine-APAP, sleep, (TYLENOL PM EXTRA STRENGTH PO) Take by mouth.     estradiol  (ESTRACE  VAGINAL) 0.1 MG/GM vaginal cream Apply 0.5mg  (pea-sized amount)  just inside the vaginal introitus with a finger-tip on Monday, Wednesday and Friday nights. 30 g 12   levothyroxine  (SYNTHROID )  112 MCG tablet Take 1 tablet by mouth every morning on an empty stomach with water only.  No food or other medications for 30 minutes. 90 tablet 0   losartan  (COZAAR ) 100 MG tablet TAKE ONE TABLET BY MOUTH ONCE A DAY FOR HIGH BLOOD PRESSURE. 90 tablet 2   metoprolol  tartrate (LOPRESSOR ) 50 MG tablet TAKE ONE TABLET BY MOUTH TWICE A DAY FOR BLOOD PRESSURE. 180 tablet 2   RA CRANBERRY 500 MG CAPS SMARTSIG:1 By Mouth     rosuvastatin  (CRESTOR ) 5 MG tablet TAKE ONE TABLET (5 MG TOTAL) BY MOUTH EVERY OTHER DAY. FOR CHOLESTEROL. 45 tablet 3   TYLENOL 500 MG tablet SMARTSIG:1 By Mouth     venlafaxine  XR (EFFEXOR -XR) 75 MG 24 hr capsule TAKE ONE CAPSULE BY MOUTH ONCE DAILY WITH BREAKFAST FOR ANXIETY 90 capsule 2   Zinc 50 MG TABS Take by mouth.     omeprazole  (PRILOSEC) 20 MG capsule TAKE 1 CAPSULE BY MOUTH ONCE DAILY FOR HEARTBURN (Patient not taking: Reported on 09/13/2023) 90 capsule 1   oxybutynin  (DITROPAN -XL) 10 MG 24 hr tablet Take 1 tablet (10 mg total) by mouth at bedtime. (Patient not taking: Reported on 09/13/2023) 90 tablet 3   No current facility-administered medications on file prior to visit.    BP 124/72   Pulse 85   Temp (!) 97.3 F (36.3 C) (Temporal)   Ht 5' 4 (1.626 m)   Wt 149 lb (67.6 kg)   SpO2 97%   BMI 25.58 kg/m  Objective:   Physical Exam Constitutional:      Appearance: She is not ill-appearing.  HENT:     Right Ear: Tympanic membrane and ear canal normal.     Left Ear: Tympanic membrane and ear canal normal.     Nose: No mucosal edema.     Right Sinus: No maxillary sinus tenderness or frontal sinus tenderness.     Left Sinus: No maxillary sinus  tenderness or frontal sinus tenderness.     Mouth/Throat:     Mouth: Mucous membranes are moist.   Eyes:     Conjunctiva/sclera: Conjunctivae normal.    Cardiovascular:     Rate and Rhythm: Normal rate and regular rhythm.  Pulmonary:     Effort: Pulmonary effort is normal.     Breath sounds: Normal breath sounds. No wheezing or rhonchi.   Musculoskeletal:     Cervical back: Neck supple.   Skin:    General: Skin is warm and dry.           Assessment & Plan:  Acute cough Assessment & Plan: Likely viral etiology, especially given slow improvement post antibiotics. Respiratory exam today reassuring.  Will check chest x-ray today given reports from daughter. Continue Delsym/Robitussin if needed.  Await results.  Orders: -     DG Chest 2 View        Comer MARLA Gaskins, NP

## 2023-09-17 DIAGNOSIS — L814 Other melanin hyperpigmentation: Secondary | ICD-10-CM | POA: Diagnosis not present

## 2023-09-17 DIAGNOSIS — L820 Inflamed seborrheic keratosis: Secondary | ICD-10-CM | POA: Diagnosis not present

## 2023-09-17 DIAGNOSIS — L821 Other seborrheic keratosis: Secondary | ICD-10-CM | POA: Diagnosis not present

## 2023-09-17 DIAGNOSIS — L7211 Pilar cyst: Secondary | ICD-10-CM | POA: Diagnosis not present

## 2023-09-17 DIAGNOSIS — L720 Epidermal cyst: Secondary | ICD-10-CM | POA: Diagnosis not present

## 2023-09-17 DIAGNOSIS — Z85828 Personal history of other malignant neoplasm of skin: Secondary | ICD-10-CM | POA: Diagnosis not present

## 2023-09-17 DIAGNOSIS — D225 Melanocytic nevi of trunk: Secondary | ICD-10-CM | POA: Diagnosis not present

## 2023-10-07 ENCOUNTER — Other Ambulatory Visit: Payer: Self-pay | Admitting: Primary Care

## 2023-10-07 DIAGNOSIS — E039 Hypothyroidism, unspecified: Secondary | ICD-10-CM

## 2023-10-28 ENCOUNTER — Other Ambulatory Visit: Payer: Self-pay | Admitting: Primary Care

## 2023-10-28 DIAGNOSIS — F411 Generalized anxiety disorder: Secondary | ICD-10-CM

## 2023-11-05 ENCOUNTER — Other Ambulatory Visit: Payer: Self-pay | Admitting: Primary Care

## 2023-11-05 DIAGNOSIS — I10 Essential (primary) hypertension: Secondary | ICD-10-CM

## 2023-11-07 DIAGNOSIS — Z961 Presence of intraocular lens: Secondary | ICD-10-CM | POA: Diagnosis not present

## 2023-11-25 ENCOUNTER — Other Ambulatory Visit: Payer: Self-pay | Admitting: Primary Care

## 2023-11-25 DIAGNOSIS — I10 Essential (primary) hypertension: Secondary | ICD-10-CM

## 2023-11-27 ENCOUNTER — Ambulatory Visit (INDEPENDENT_AMBULATORY_CARE_PROVIDER_SITE_OTHER): Admitting: Podiatry

## 2023-11-27 DIAGNOSIS — M79676 Pain in unspecified toe(s): Secondary | ICD-10-CM

## 2023-11-27 DIAGNOSIS — B351 Tinea unguium: Secondary | ICD-10-CM

## 2023-11-27 NOTE — Progress Notes (Signed)
 She presents today chief complaint of painful elongated toenails.  Objective: Toenails are long thick yellow dystrophic like mycotic pulses are palpable.  No open lesions or wounds are noted.  Joints distal to the ankle full range of motion without crepitation though she does have some osteoarthritic changes of the midfoot and toes.  Assessment: Pain in limb secondary to onychomycosis.  Plan: Debridement of toenails 1 through 5 bilateral.

## 2023-12-17 DIAGNOSIS — M25562 Pain in left knee: Secondary | ICD-10-CM | POA: Diagnosis not present

## 2023-12-20 ENCOUNTER — Telehealth: Payer: Self-pay

## 2023-12-20 NOTE — Telephone Encounter (Signed)
 Copied from CRM #[id number]. Topic: General - Other >> Dec 20, 2023  1:25 PM Donna BRAVO wrote: Reason for CRM: patient daughter Vina has many questions and concerns regarding Tacha (mom).  Vina would like to speak with a Comer Gaskins NP or her nurse.  Mikela has an appt on 12/26/23 at 10:40am  Vina would like to speak to a nurse before Myonna's appt on the 10/09//25     [facility] phone number [phone]

## 2023-12-20 NOTE — Telephone Encounter (Signed)
 Patient daughter has a list of concerns she would like to provide prior to upcoming appt due to the patient not wanting daughter to disclose certain information. She was advised Ashlee Lopez is out of the office until Oct. 7th  Has had 3 falls within the past year, legs are weak. Walks with a walker at all times. Pt daughter reports physical body has declined since last year. Pt is driving some still, patients family is concerned this is not safe. Daughter states she has had increasing neuropathy in legs and feet, when pt gets in the car she has to physically lift her legs to get in.  Bilateral ongoing foot swelling ADLs are getting very difficult for patient, daughter questions if someone needs to come help her a couple days of the week.   Pts daughter request Ashlee Lopez not mention to patient that she had called prior to with all this information, she would just like Ashlee Lopez to discus these specific things with patient.

## 2023-12-23 NOTE — Telephone Encounter (Signed)
 Noted. Can we move her down to the 3 pm slot to make her a 40 minute visit? This seems like a lot to discuss during a follow up visit.

## 2023-12-24 NOTE — Telephone Encounter (Signed)
 Patient called back in and appt has been changed.

## 2023-12-24 NOTE — Telephone Encounter (Signed)
 Unable to reach patient. Left voicemail to return call to our office.

## 2023-12-26 ENCOUNTER — Ambulatory Visit: Admitting: Primary Care

## 2023-12-26 ENCOUNTER — Encounter: Payer: Self-pay | Admitting: Primary Care

## 2023-12-26 VITALS — BP 138/82 | HR 72 | Temp 97.8°F | Ht 64.0 in | Wt 155.0 lb

## 2023-12-26 DIAGNOSIS — Z23 Encounter for immunization: Secondary | ICD-10-CM | POA: Diagnosis not present

## 2023-12-26 DIAGNOSIS — Z853 Personal history of malignant neoplasm of breast: Secondary | ICD-10-CM

## 2023-12-26 DIAGNOSIS — R053 Chronic cough: Secondary | ICD-10-CM

## 2023-12-26 DIAGNOSIS — N3281 Overactive bladder: Secondary | ICD-10-CM | POA: Diagnosis not present

## 2023-12-26 DIAGNOSIS — F411 Generalized anxiety disorder: Secondary | ICD-10-CM

## 2023-12-26 DIAGNOSIS — R531 Weakness: Secondary | ICD-10-CM

## 2023-12-26 DIAGNOSIS — N390 Urinary tract infection, site not specified: Secondary | ICD-10-CM | POA: Diagnosis not present

## 2023-12-26 DIAGNOSIS — D693 Immune thrombocytopenic purpura: Secondary | ICD-10-CM

## 2023-12-26 DIAGNOSIS — R296 Repeated falls: Secondary | ICD-10-CM

## 2023-12-26 DIAGNOSIS — I10 Essential (primary) hypertension: Secondary | ICD-10-CM

## 2023-12-26 DIAGNOSIS — E039 Hypothyroidism, unspecified: Secondary | ICD-10-CM

## 2023-12-26 DIAGNOSIS — E785 Hyperlipidemia, unspecified: Secondary | ICD-10-CM

## 2023-12-26 DIAGNOSIS — R2689 Other abnormalities of gait and mobility: Secondary | ICD-10-CM

## 2023-12-26 NOTE — Assessment & Plan Note (Signed)
 Controlled!  Continue Estrace  vaginal cream.

## 2023-12-26 NOTE — Assessment & Plan Note (Signed)
 Chronic continued.  No concerns today.  Remain off oxybutynin  XL 10 mg for now. Follow-up with urology.

## 2023-12-26 NOTE — Assessment & Plan Note (Signed)
 Improved.

## 2023-12-26 NOTE — Progress Notes (Signed)
 Subjective:    Patient ID: [name], female    DOB: February 02, 1934, 89 y.o.   MRN: [id number]  SKYY NILAN is a very pleasant 89 y.o. female with a history of hypertension, IBS, hypothyroidism, uterine cancer, ITP, Graves' disease, recurrent falls who presents today for follow-up of chronic conditions.  Her daughter joins us  today.  1) Hypertension/Hyperlipidemia: Currently managed on amlodipine  10 mg daily, losartan  100 mg daily, metoprolol  titrate 40 mg twice daily, rosuvastatin  5 mg daily. She denies chest pain, shortness of breath.   BP Readings from Last 3 Encounters:  12/26/23 138/82  09/13/23 124/72  08/30/23 135/85     2) GAD: Currently managed on venlafaxine  ER 75 mg daily. She feels well managed on her regimen.   3) Hypothyroidism: Currently managed on levothyroxine  112 mcg tablets daily.  She is taking every morning on an empty stomach with water only.   No food or other medications for 30 minutes.   No heartburn medication, iron pills, calcium , vitamin D , or magnesium pills within four hours of taking levothyroxine .   4) Overactive Bladder/Recurrent UTI: Previously managed on oxybutynin  XL 10 mg daily, no longer taking as it wasn't effective. Following with Urology, last visit was in February 2025. Also managed on Estrace  vaginal cream and is compliant.   5) Recurrent Falls: Her daughter joins us  today who reports 3 falls within the last year due to generalized weakness. She continues to drive, however, family is concerned due to her increased neuropathy to the lower extremities. ADLs are difficult for patient to complete and family is requesting a home health aid.   She is following with orthopedics for her chronic knee pain, received an injection last week.  She uses her walker art home and when out of home. Her last fall was November 13, 2023. She falls as her legs give out. She completed PT last year which was beneficial. She is interested in home health PT.   Review  of Systems  Respiratory:  Negative for shortness of breath.   Cardiovascular:  Negative for chest pain.  Gastrointestinal:  Negative for constipation and diarrhea.  Musculoskeletal:  Positive for arthralgias.  Neurological:  Positive for weakness.  Psychiatric/Behavioral:  The patient is not nervous/anxious.          Past Medical History:  Diagnosis Date   Benign essential HTN 03/21/2011   Breast cancer, stage 1 (HCC) 02/10/2003   Right tubular breast cancer   Cancer (HCC)    Diverticulosis of colon 03/23/2011   Family history of breast cancer    Family history of colon cancer    Family history of melanoma    Family history of prostate cancer    Fibrocystic disease of breast 03/23/2011   Graves' disease with exophthalmos 03/23/2011   Hypertension    Hypothyroid 03/21/2011   IBS (irritable bowel syndrome) 03/23/2011   ITP (idiopathic thrombocytopenic purpura) 03/21/2011   Nasal fracture 01/2020   S/P splenectomy 03/21/2011   Uterus cancer (HCC) 03/23/1999   Well differentiated AdenoCA of endometrium-superficially confined   Varicose vein of leg 03/23/2011    Social History   Socioeconomic History   Marital status: Widowed    Spouse name: Not on file   Number of children: Not on file   Years of education: Not on file   Highest education level: Not on file  Occupational History   Not on file  Tobacco Use   Smoking status: Never    Passive exposure: Never   Smokeless  tobacco: Never  Vaping Use   Vaping status: Never Used  Substance and Sexual Activity   Alcohol use: No   Drug use: No   Sexual activity: Never    Birth control/protection: Surgical  Other Topics Concern   Not on file  Social History Narrative   Widow. Lives alone.    3 children, 5 grandchildren.   Retired. Once worked in Community education officer.   Enjoys reading, watching TV.   Social Drivers of Corporate investment banker Strain: Low Risk  (08/14/2023)   Overall Financial Resource Strain (CARDIA)    Difficulty of Paying  Living Expenses: Not hard at all  Food Insecurity: No Food Insecurity (08/14/2023)   Hunger Vital Sign    Worried About Running Out of Food in the Last Year: Never true    Ran Out of Food in the Last Year: Never true  Transportation Needs: No Transportation Needs (08/14/2023)   PRAPARE - Administrator, Civil Service (Medical): No    Lack of Transportation (Non-Medical): No  Physical Activity: Inactive (08/14/2023)   Exercise Vital Sign    Days of Exercise per Week: 0 days    Minutes of Exercise per Session: 0 min  Stress: No Stress Concern Present (08/14/2023)   [employer] of Occupational Health - Occupational Stress Questionnaire    Feeling of Stress : Not at all  Social Connections: Moderately Isolated (08/14/2023)   Social Connection and Isolation Panel    Frequency of Communication with Friends and Family: More than three times a week    Frequency of Social Gatherings with Friends and Family: More than three times a week    Attends Religious Services: More than 4 times per year    Active Member of [employer] or Organizations: No    Attends Banker Meetings: Never    Marital Status: Widowed  Intimate Partner Violence: Not At Risk (08/14/2023)   Humiliation, Afraid, Rape, and Kick questionnaire    Fear of Current or Ex-Partner: No    Emotionally Abused: No    Physically Abused: No    Sexually Abused: No    Past Surgical History:  Procedure Laterality Date   ABDOMINAL HYSTERECTOMY  2001   APPENDECTOMY  1941   BREAST SURGERY  02/17/2003   Mastectomy-Right   MASTECTOMY  2004   Dr Merrilyn   OOPHORECTOMY     BSO   SKIN CANCER EXCISION  2019   removal of cancer from ear   SPLENECTOMY  1986    Family History  Problem Relation Age of Onset   Breast cancer Mother        Age 15   Hypertension Father    Heart disease Father    Lung cancer Father    Breast cancer Sister        Age 65   Melanoma Sister        dx in her 70s   Dementia Sister    Heart  disease Maternal Aunt    Prostate cancer Maternal Uncle    Colon cancer Maternal Grandfather    Colon cancer Maternal Uncle    Prostate cancer Maternal Uncle    Dementia Maternal Aunt    Breast cancer Cousin        maternal 2nd cousin   Breast cancer Cousin        maternal first cousin dx in her 35s   Melanoma Son        dx in his 59s    Allergies  Allergen Reactions   Sulfamethoxazole-Trimethoprim Hives   Septra [Bactrim]     Current Outpatient Medications on File Prior to Visit  Medication Sig Dispense Refill   amLODipine  (NORVASC ) 10 MG tablet TAKE ONE TABLET BY MOUTH DAILY FOR BLOOD PRESSURE 90 tablet 0   aspirin 81 MG tablet Take 81 mg by mouth daily.     Cholecalciferol (VITAMIN D -3) 25 MCG (1000 UT) CAPS Take by mouth.     CRANBERRY PO Take 3 tablets by mouth daily.     D-MANNOSE PO Take by mouth.     Diphenhydramine-APAP, sleep, (TYLENOL PM EXTRA STRENGTH PO) Take by mouth.     estradiol  (ESTRACE  VAGINAL) 0.1 MG/GM vaginal cream Apply 0.5mg  (pea-sized amount)  just inside the vaginal introitus with a finger-tip on Monday, Wednesday and Friday nights. 30 g 12   levothyroxine  (SYNTHROID ) 112 MCG tablet TAKE ONE TABLET BY MOUTH EVERY MORNING ON AN EMPTY STOMACH WITH WATER ONLY. NO FOOD OR OTHER MEDICATIONS FOR 30 MINUTES. 90 tablet 0   losartan  (COZAAR ) 100 MG tablet TAKE ONE TABLET BY MOUTH ONCE A DAY FOR HIGH BLOOD PRESSURE. 90 tablet 0   metoprolol  tartrate (LOPRESSOR ) 50 MG tablet TAKE ONE TABLET BY MOUTH TWICE A DAY FOR BLOOD PRESSURE. 180 tablet 0   RA CRANBERRY 500 MG CAPS SMARTSIG:1 By Mouth     rosuvastatin  (CRESTOR ) 5 MG tablet TAKE ONE TABLET (5 MG TOTAL) BY MOUTH EVERY OTHER DAY. FOR CHOLESTEROL. 45 tablet 3   TYLENOL 500 MG tablet SMARTSIG:1 By Mouth     venlafaxine  XR (EFFEXOR -XR) 75 MG 24 hr capsule TAKE ONE CAPSULE BY MOUTH ONCE DAILY WITH BREAKFAST FOR ANXIETY 90 capsule 0   Zinc 50 MG TABS Take by mouth.     No current facility-administered medications on  file prior to visit.    BP 138/82   Pulse 72   Temp 97.8 F (36.6 C) (Temporal)   Ht 5' 4 (1.626 m)   Wt 155 lb (70.3 kg)   SpO2 100%   BMI 26.61 kg/m  Objective:   Physical Exam Cardiovascular:     Rate and Rhythm: Normal rate and regular rhythm.  Pulmonary:     Effort: Pulmonary effort is normal.     Breath sounds: Normal breath sounds.  Chest:  Breasts:    Right: Absent.     Left: No swelling, mass, skin change or tenderness.  Musculoskeletal:     Cervical back: Neck supple.  Skin:    General: Skin is warm and dry.  Neurological:     Mental Status: She is alert and oriented to person, place, and time.  Psychiatric:        Mood and Affect: Mood normal.     Physical Exam        Assessment & Plan:  Recurrent falls Assessment & Plan: Commended her on use of walkers and other safety devices when ambulating.  Referral placed for home health physical therapy for strengthening, endurance, balance. She and her daughter agree.  Orders: -     Ambulatory referral to Home Health  Imbalance -     Ambulatory referral to Home Health  Generalized weakness -     Ambulatory referral to Home Health  Hypothyroidism, unspecified type Assessment & Plan: She is taking levothyroxine  correctly.  Continue levothyroxine  112 mcg daily. Repeat TSH pending.  Orders: -     TSH  Hyperlipidemia, unspecified hyperlipidemia type Assessment & Plan: Repeat lipid panel pending. Continue rosuvastatin  5 mg daily.  Orders: -  Comprehensive metabolic panel with GFR -     Lipid panel  Benign essential HTN Assessment & Plan: Controlled.   Continue amlodipine  10 mg daily, losartan  100 mg daily, metoprolol  tartrate 50 mg twice daily. CMP pending  Orders: -     CBC  Encounter for immunization -     Flu vaccine HIGH DOSE PF(Fluzone Trivalent)  Recurrent UTI Assessment & Plan: Controlled!  Continue Estrace  vaginal cream.   Overactive bladder Assessment &  Plan: Chronic continued.  No concerns today.  Remain off oxybutynin  XL 10 mg for now. Follow-up with urology.   Idiopathic thrombocytopenic purpura (HCC) Assessment & Plan: CBC ordered and pending.   History of breast cancer, invasive mammary right  Assessment & Plan: Mammogram up-to-date.   Generalized anxiety disorder Assessment & Plan: Controlled.  Continue venlafaxine  ER 75 mg daily.   Chronic cough Assessment & Plan: Improved.     Assessment and Plan Assessment & Plan         Comer MARLA Gaskins, NP     History of Present Illness

## 2023-12-26 NOTE — Assessment & Plan Note (Signed)
She is taking levothyroxine correctly.   Continue levothyroxine 112 mcg daily.  Repeat TSH pending. 

## 2023-12-26 NOTE — Assessment & Plan Note (Signed)
 Controlled.   Continue amlodipine  10 mg daily, losartan  100 mg daily, metoprolol  tartrate 50 mg twice daily. CMP pending

## 2023-12-26 NOTE — Assessment & Plan Note (Signed)
Mammogram up to date.  

## 2023-12-26 NOTE — Patient Instructions (Signed)
 You will either be contacted via phone regarding your referral to home health and school therapy, or you may receive a letter on your MyChart portal from our referral team with instructions for scheduling an appointment. Please let us  know if you have not been contacted by anyone within two weeks.  Stop by the lab prior to leaving today. I will notify you of your results once received.   It was a pleasure to see you today!

## 2023-12-26 NOTE — Assessment & Plan Note (Signed)
 Commended her on use of walkers and other safety devices when ambulating.  Referral placed for home health physical therapy for strengthening, endurance, balance. She and her daughter agree.

## 2023-12-26 NOTE — Assessment & Plan Note (Signed)
 CBC ordered and pending.

## 2023-12-26 NOTE — Assessment & Plan Note (Signed)
 Repeat lipid panel pending. Continue rosuvastatin 5 mg daily.

## 2023-12-26 NOTE — Assessment & Plan Note (Signed)
Controlled.  Continue venlafaxine ER 75 mg daily.

## 2023-12-27 ENCOUNTER — Ambulatory Visit: Payer: Self-pay | Admitting: Primary Care

## 2023-12-27 LAB — CBC
Hematocrit: 38.1 % (ref 34.0–46.6)
Hemoglobin: 12.7 g/dL (ref 11.1–15.9)
MCH: 31.4 pg (ref 26.6–33.0)
MCHC: 33.3 g/dL (ref 31.5–35.7)
MCV: 94 fL (ref 79–97)
Platelets: 362 x10E3/uL (ref 150–450)
RBC: 4.04 x10E6/uL (ref 3.77–5.28)
RDW: 12.3 % (ref 11.7–15.4)
WBC: 11.9 x10E3/uL — ABNORMAL HIGH (ref 3.4–10.8)

## 2023-12-27 LAB — COMPREHENSIVE METABOLIC PANEL WITH GFR
ALT: 18 IU/L (ref 0–32)
AST: 19 IU/L (ref 0–40)
Albumin: 4.2 g/dL (ref 3.7–4.7)
Alkaline Phosphatase: 99 IU/L (ref 48–129)
BUN/Creatinine Ratio: 34 — ABNORMAL HIGH (ref 12–28)
BUN: 24 mg/dL (ref 8–27)
Bilirubin Total: 0.5 mg/dL (ref 0.0–1.2)
CO2: 25 mmol/L (ref 20–29)
Calcium: 9.8 mg/dL (ref 8.7–10.3)
Chloride: 98 mmol/L (ref 96–106)
Creatinine, Ser: 0.71 mg/dL (ref 0.57–1.00)
Globulin, Total: 3 g/dL (ref 1.5–4.5)
Glucose: 99 mg/dL (ref 70–99)
Potassium: 3.8 mmol/L (ref 3.5–5.2)
Sodium: 136 mmol/L (ref 134–144)
Total Protein: 7.2 g/dL (ref 6.0–8.5)
eGFR: 82 mL/min/1.73 (ref >59)

## 2023-12-27 LAB — TSH: TSH: 1.97 u[IU]/mL (ref 0.450–4.500)

## 2023-12-27 LAB — LIPID PANEL
Chol/HDL Ratio: 2.4 ratio (ref 0.0–4.4)
Cholesterol, Total: 159 mg/dL (ref 100–199)
HDL: 67 mg/dL (ref >39)
LDL Chol Calc (NIH): 75 mg/dL (ref 0–99)
Triglycerides: 94 mg/dL (ref 0–149)
VLDL Cholesterol Cal: 17 mg/dL (ref 5–40)

## 2023-12-31 ENCOUNTER — Other Ambulatory Visit: Payer: Self-pay | Admitting: Primary Care

## 2023-12-31 DIAGNOSIS — E0500 Thyrotoxicosis with diffuse goiter without thyrotoxic crisis or storm: Secondary | ICD-10-CM | POA: Diagnosis not present

## 2023-12-31 DIAGNOSIS — Z90722 Acquired absence of ovaries, bilateral: Secondary | ICD-10-CM | POA: Diagnosis not present

## 2023-12-31 DIAGNOSIS — Z9181 History of falling: Secondary | ICD-10-CM | POA: Diagnosis not present

## 2023-12-31 DIAGNOSIS — Z5982 Transportation insecurity: Secondary | ICD-10-CM | POA: Diagnosis not present

## 2023-12-31 DIAGNOSIS — Z9011 Acquired absence of right breast and nipple: Secondary | ICD-10-CM | POA: Diagnosis not present

## 2023-12-31 DIAGNOSIS — Z79899 Other long term (current) drug therapy: Secondary | ICD-10-CM | POA: Diagnosis not present

## 2023-12-31 DIAGNOSIS — N390 Urinary tract infection, site not specified: Secondary | ICD-10-CM | POA: Diagnosis not present

## 2023-12-31 DIAGNOSIS — E039 Hypothyroidism, unspecified: Secondary | ICD-10-CM | POA: Diagnosis not present

## 2023-12-31 DIAGNOSIS — N3281 Overactive bladder: Secondary | ICD-10-CM | POA: Diagnosis not present

## 2023-12-31 DIAGNOSIS — K589 Irritable bowel syndrome without diarrhea: Secondary | ICD-10-CM | POA: Diagnosis not present

## 2023-12-31 DIAGNOSIS — E785 Hyperlipidemia, unspecified: Secondary | ICD-10-CM | POA: Diagnosis not present

## 2023-12-31 DIAGNOSIS — Z90710 Acquired absence of both cervix and uterus: Secondary | ICD-10-CM | POA: Diagnosis not present

## 2023-12-31 DIAGNOSIS — R296 Repeated falls: Secondary | ICD-10-CM | POA: Diagnosis not present

## 2023-12-31 DIAGNOSIS — K5730 Diverticulosis of large intestine without perforation or abscess without bleeding: Secondary | ICD-10-CM | POA: Diagnosis not present

## 2023-12-31 DIAGNOSIS — Z9081 Acquired absence of spleen: Secondary | ICD-10-CM | POA: Diagnosis not present

## 2023-12-31 DIAGNOSIS — D693 Immune thrombocytopenic purpura: Secondary | ICD-10-CM | POA: Diagnosis not present

## 2023-12-31 DIAGNOSIS — Z8542 Personal history of malignant neoplasm of other parts of uterus: Secondary | ICD-10-CM | POA: Diagnosis not present

## 2023-12-31 DIAGNOSIS — F411 Generalized anxiety disorder: Secondary | ICD-10-CM | POA: Diagnosis not present

## 2023-12-31 DIAGNOSIS — I10 Essential (primary) hypertension: Secondary | ICD-10-CM | POA: Diagnosis not present

## 2023-12-31 DIAGNOSIS — Z85828 Personal history of other malignant neoplasm of skin: Secondary | ICD-10-CM | POA: Diagnosis not present

## 2023-12-31 DIAGNOSIS — I8390 Asymptomatic varicose veins of unspecified lower extremity: Secondary | ICD-10-CM | POA: Diagnosis not present

## 2023-12-31 DIAGNOSIS — Z853 Personal history of malignant neoplasm of breast: Secondary | ICD-10-CM | POA: Diagnosis not present

## 2023-12-31 DIAGNOSIS — Z9049 Acquired absence of other specified parts of digestive tract: Secondary | ICD-10-CM | POA: Diagnosis not present

## 2023-12-31 DIAGNOSIS — Z7982 Long term (current) use of aspirin: Secondary | ICD-10-CM | POA: Diagnosis not present

## 2024-01-07 ENCOUNTER — Other Ambulatory Visit: Payer: Self-pay | Admitting: Primary Care

## 2024-01-07 DIAGNOSIS — E039 Hypothyroidism, unspecified: Secondary | ICD-10-CM

## 2024-01-14 DIAGNOSIS — E039 Hypothyroidism, unspecified: Secondary | ICD-10-CM | POA: Diagnosis not present

## 2024-01-14 DIAGNOSIS — I10 Essential (primary) hypertension: Secondary | ICD-10-CM | POA: Diagnosis not present

## 2024-01-14 DIAGNOSIS — F411 Generalized anxiety disorder: Secondary | ICD-10-CM | POA: Diagnosis not present

## 2024-01-14 DIAGNOSIS — E785 Hyperlipidemia, unspecified: Secondary | ICD-10-CM | POA: Diagnosis not present

## 2024-01-14 DIAGNOSIS — I8390 Asymptomatic varicose veins of unspecified lower extremity: Secondary | ICD-10-CM | POA: Diagnosis not present

## 2024-01-14 DIAGNOSIS — K589 Irritable bowel syndrome without diarrhea: Secondary | ICD-10-CM | POA: Diagnosis not present

## 2024-01-21 DIAGNOSIS — N390 Urinary tract infection, site not specified: Secondary | ICD-10-CM | POA: Diagnosis not present

## 2024-01-21 DIAGNOSIS — K589 Irritable bowel syndrome without diarrhea: Secondary | ICD-10-CM | POA: Diagnosis not present

## 2024-01-21 DIAGNOSIS — E039 Hypothyroidism, unspecified: Secondary | ICD-10-CM | POA: Diagnosis not present

## 2024-01-21 DIAGNOSIS — F411 Generalized anxiety disorder: Secondary | ICD-10-CM | POA: Diagnosis not present

## 2024-01-21 DIAGNOSIS — D693 Immune thrombocytopenic purpura: Secondary | ICD-10-CM | POA: Diagnosis not present

## 2024-01-21 DIAGNOSIS — E0500 Thyrotoxicosis with diffuse goiter without thyrotoxic crisis or storm: Secondary | ICD-10-CM | POA: Diagnosis not present

## 2024-01-21 DIAGNOSIS — K5730 Diverticulosis of large intestine without perforation or abscess without bleeding: Secondary | ICD-10-CM | POA: Diagnosis not present

## 2024-01-21 DIAGNOSIS — I8390 Asymptomatic varicose veins of unspecified lower extremity: Secondary | ICD-10-CM | POA: Diagnosis not present

## 2024-01-21 DIAGNOSIS — R296 Repeated falls: Secondary | ICD-10-CM | POA: Diagnosis not present

## 2024-01-21 DIAGNOSIS — I10 Essential (primary) hypertension: Secondary | ICD-10-CM | POA: Diagnosis not present

## 2024-01-21 DIAGNOSIS — E785 Hyperlipidemia, unspecified: Secondary | ICD-10-CM | POA: Diagnosis not present

## 2024-01-21 DIAGNOSIS — N3281 Overactive bladder: Secondary | ICD-10-CM | POA: Diagnosis not present

## 2024-01-27 ENCOUNTER — Other Ambulatory Visit: Payer: Self-pay | Admitting: Primary Care

## 2024-01-27 DIAGNOSIS — F411 Generalized anxiety disorder: Secondary | ICD-10-CM

## 2024-01-27 DIAGNOSIS — I10 Essential (primary) hypertension: Secondary | ICD-10-CM | POA: Diagnosis not present

## 2024-01-27 DIAGNOSIS — K589 Irritable bowel syndrome without diarrhea: Secondary | ICD-10-CM | POA: Diagnosis not present

## 2024-01-27 DIAGNOSIS — E039 Hypothyroidism, unspecified: Secondary | ICD-10-CM | POA: Diagnosis not present

## 2024-01-27 DIAGNOSIS — I8390 Asymptomatic varicose veins of unspecified lower extremity: Secondary | ICD-10-CM | POA: Diagnosis not present

## 2024-01-27 DIAGNOSIS — E785 Hyperlipidemia, unspecified: Secondary | ICD-10-CM | POA: Diagnosis not present

## 2024-02-05 ENCOUNTER — Other Ambulatory Visit: Payer: Self-pay | Admitting: Primary Care

## 2024-02-05 DIAGNOSIS — I10 Essential (primary) hypertension: Secondary | ICD-10-CM

## 2024-02-10 ENCOUNTER — Other Ambulatory Visit: Payer: Self-pay | Admitting: Primary Care

## 2024-02-10 DIAGNOSIS — I10 Essential (primary) hypertension: Secondary | ICD-10-CM

## 2024-02-26 ENCOUNTER — Ambulatory Visit: Admitting: Podiatry

## 2024-03-05 ENCOUNTER — Other Ambulatory Visit: Payer: Self-pay | Admitting: Urology

## 2024-03-05 DIAGNOSIS — N952 Postmenopausal atrophic vaginitis: Secondary | ICD-10-CM

## 2024-03-25 ENCOUNTER — Encounter: Payer: Self-pay | Admitting: Podiatry

## 2024-03-25 ENCOUNTER — Ambulatory Visit: Admitting: Podiatry

## 2024-03-25 DIAGNOSIS — B351 Tinea unguium: Secondary | ICD-10-CM | POA: Diagnosis not present

## 2024-03-25 DIAGNOSIS — M79676 Pain in unspecified toe(s): Secondary | ICD-10-CM | POA: Diagnosis not present

## 2024-03-25 NOTE — Progress Notes (Signed)
 She presents today chief complaint of painful elongated toenails.  Objective: Toenails are long thick yellow dystrophic like mycotic pulses are palpable.  No open lesions or wounds are noted.  Joints distal to the ankle full range of motion without crepitation though she does have some osteoarthritic changes of the midfoot and toes.  Assessment: Pain in limb secondary to onychomycosis.  Plan: Debridement of toenails 1 through 5 bilateral.  Remember to ask how her new grandbaby is

## 2024-03-27 ENCOUNTER — Telehealth: Payer: Self-pay | Admitting: Primary Care

## 2024-03-27 NOTE — Telephone Encounter (Signed)
 Copied from CRM #[id number]. Topic: General - Other >> Mar 27, 2024  9:35 AM Jayma L wrote: Reason for CRM: patient has a new grandchild and great grandchild, wanting to know when last TDAP shot was.   Callback at [phone]

## 2024-03-27 NOTE — Telephone Encounter (Signed)
 According to pt's chart, last Tdap was 02/05/2020.  Lvm asking pt to call back. Pls relay info above concerning Tdap vaccine.

## 2024-03-30 NOTE — Telephone Encounter (Addendum)
 Spoke with pt relaying info about her Tdap shot. Pt verbalizes understanding and expresses her thanks for the call.

## 2024-04-20 ENCOUNTER — Ambulatory Visit: Payer: Medicare Other | Admitting: Physician Assistant

## 2024-04-20 ENCOUNTER — Ambulatory Visit: Admitting: Physician Assistant

## 2024-05-06 ENCOUNTER — Ambulatory Visit: Admitting: Physician Assistant

## 2024-05-08 ENCOUNTER — Ambulatory Visit (INDEPENDENT_AMBULATORY_CARE_PROVIDER_SITE_OTHER): Admitting: Audiology

## 2024-06-24 ENCOUNTER — Ambulatory Visit: Admitting: Podiatry

## 2024-08-18 ENCOUNTER — Ambulatory Visit
